# Patient Record
Sex: Female | Born: 1985 | Race: Black or African American | Hispanic: No | Marital: Single | Smoking: Never smoker
Health system: Southern US, Community
[De-identification: ages and names within clinical notes are randomized; demographics above are authoritative.]

## PROBLEM LIST (undated history)

## (undated) DIAGNOSIS — R252 Cramp and spasm: Secondary | ICD-10-CM

## (undated) DIAGNOSIS — Z975 Presence of (intrauterine) contraceptive device: Secondary | ICD-10-CM

## (undated) DIAGNOSIS — Z3490 Encounter for supervision of normal pregnancy, unspecified, unspecified trimester: Secondary | ICD-10-CM

## (undated) DIAGNOSIS — E876 Hypokalemia: Secondary | ICD-10-CM

## (undated) DIAGNOSIS — T50905A Adverse effect of unspecified drugs, medicaments and biological substances, initial encounter: Secondary | ICD-10-CM

## (undated) DIAGNOSIS — R609 Edema, unspecified: Secondary | ICD-10-CM

## (undated) DIAGNOSIS — R87629 Unspecified abnormal cytological findings in specimens from vagina: Secondary | ICD-10-CM

## (undated) DIAGNOSIS — G935 Compression of brain: Secondary | ICD-10-CM

## (undated) DIAGNOSIS — Z30017 Encounter for initial prescription of implantable subdermal contraceptive: Secondary | ICD-10-CM

## (undated) DIAGNOSIS — IMO0002 Reserved for concepts with insufficient information to code with codable children: Secondary | ICD-10-CM

## (undated) DIAGNOSIS — R140 Abdominal distension (gaseous): Secondary | ICD-10-CM

## (undated) DIAGNOSIS — R87619 Unspecified abnormal cytological findings in specimens from cervix uteri: Secondary | ICD-10-CM

## (undated) DIAGNOSIS — K219 Gastro-esophageal reflux disease without esophagitis: Secondary | ICD-10-CM

## (undated) DIAGNOSIS — N911 Secondary amenorrhea: Secondary | ICD-10-CM

## (undated) DIAGNOSIS — E669 Obesity, unspecified: Secondary | ICD-10-CM

## (undated) HISTORY — DX: Unspecified abnormal cytological findings in specimens from cervix uteri: R87.619

## (undated) HISTORY — DX: Abdominal distension (gaseous): R14.0

## (undated) HISTORY — DX: Reserved for concepts with insufficient information to code with codable children: IMO0002

## (undated) HISTORY — DX: Hypokalemia: E87.6

## (undated) HISTORY — DX: Secondary amenorrhea: N91.1

## (undated) HISTORY — DX: Unspecified abnormal cytological findings in specimens from vagina: R87.629

## (undated) HISTORY — DX: Encounter for initial prescription of implantable subdermal contraceptive: Z30.017

## (undated) HISTORY — DX: Obesity, unspecified: E66.9

## (undated) HISTORY — DX: Presence of (intrauterine) contraceptive device: Z97.5

## (undated) HISTORY — DX: Edema, unspecified: R60.9

## (undated) HISTORY — DX: Gastro-esophageal reflux disease without esophagitis: K21.9

## (undated) HISTORY — DX: Adverse effect of unspecified drugs, medicaments and biological substances, initial encounter: T50.905A

## (undated) HISTORY — DX: Cramp and spasm: R25.2

---

## 2009-02-13 ENCOUNTER — Emergency Department: Payer: Self-pay | Admitting: Internal Medicine

## 2011-07-04 ENCOUNTER — Emergency Department (HOSPITAL_COMMUNITY)
Admission: EM | Admit: 2011-07-04 | Discharge: 2011-07-04 | Disposition: A | Discharge status: Home / Self Care | Payer: Self-pay | Attending: Emergency Medicine | Admitting: Emergency Medicine

## 2011-07-04 ENCOUNTER — Emergency Department (HOSPITAL_COMMUNITY): Payer: Self-pay

## 2011-07-04 ENCOUNTER — Encounter: Payer: Self-pay | Admitting: *Deleted

## 2011-07-04 DIAGNOSIS — N12 Tubulo-interstitial nephritis, not specified as acute or chronic: Secondary | ICD-10-CM | POA: Insufficient documentation

## 2011-07-04 LAB — COMPREHENSIVE METABOLIC PANEL
Albumin: 3.2 g/dL — ABNORMAL LOW (ref 3.5–5.2)
BUN: 6 mg/dL (ref 6–23)
CO2: 23 mEq/L (ref 19–32)
Calcium: 8.9 mg/dL (ref 8.4–10.5)
Chloride: 99 mEq/L (ref 96–112)
Creatinine, Ser: 0.89 mg/dL (ref 0.50–1.10)
GFR calc non Af Amer: >60 mL/min (ref >60)
Total Bilirubin: 0.3 mg/dL (ref 0.3–1.2)

## 2011-07-04 LAB — URINALYSIS, ROUTINE W REFLEX MICROSCOPIC
Nitrite: NEGATIVE
Protein, ur: 100 mg/dL — AB

## 2011-07-04 LAB — CBC
HCT: 36.2 % (ref 36.0–46.0)
Hemoglobin: 12.1 g/dL (ref 12.0–15.0)
MCH: 28.2 pg (ref 26.0–34.0)
MCHC: 33.4 g/dL (ref 30.0–36.0)
MCV: 84.4 fL (ref 78.0–100.0)
RDW: 13.6 % (ref 11.5–15.5)

## 2011-07-04 LAB — DIFFERENTIAL
Basophils Absolute: 0 10*3/uL (ref 0.0–0.1)
Basophils Relative: 0 % (ref 0–1)
Eosinophils Relative: 0 % (ref 0–5)
Monocytes Absolute: 2.6 10*3/uL — ABNORMAL HIGH (ref 0.1–1.0)
Monocytes Relative: 15 % — ABNORMAL HIGH (ref 3–12)
Neutro Abs: 12.6 10*3/uL — ABNORMAL HIGH (ref 1.7–7.7)

## 2011-07-04 LAB — POCT PREGNANCY, URINE: Preg Test, Ur: NEGATIVE

## 2011-07-04 LAB — LIPASE, BLOOD: Lipase: 13 U/L (ref 11–59)

## 2011-07-04 MED ORDER — IOHEXOL 300 MG/ML  SOLN
100.0000 mL | Freq: Once | INTRAMUSCULAR | Status: AC | PRN
  Administered 2011-07-04: 100 mL via INTRAVENOUS

## 2011-07-04 MED ORDER — ACETAMINOPHEN 325 MG PO TABS
650.0000 mg | ORAL_TABLET | Freq: Once | ORAL | Status: AC
  Administered 2011-07-04: 650 mg via ORAL
  Filled 2011-07-04: qty 2

## 2011-07-04 MED ORDER — SODIUM CHLORIDE 0.9 % IV SOLN
999.0000 mL | Freq: Once | INTRAVENOUS | Status: AC
  Administered 2011-07-04: 1000 mL via INTRAVENOUS

## 2011-07-04 MED ORDER — CIPROFLOXACIN IN D5W 400 MG/200ML IV SOLN
400.0000 mg | Freq: Once | INTRAVENOUS | Status: AC
  Administered 2011-07-04: 400 mg via INTRAVENOUS
  Filled 2011-07-04: qty 200

## 2011-07-04 MED ORDER — ONDANSETRON HCL 4 MG/2ML IJ SOLN
4.0000 mg | Freq: Once | INTRAMUSCULAR | Status: AC
  Administered 2011-07-04: 4 mg via INTRAVENOUS
  Filled 2011-07-04: qty 2

## 2011-07-04 MED ORDER — CIPROFLOXACIN HCL 500 MG PO TABS: 500.0000 mg | ORAL_TABLET | Freq: Two times a day (BID) | ORAL | Status: AC

## 2011-07-04 NOTE — ED Notes (Addendum)
Pt. Ambulatory to restroom--returned to carrier--IV fluids infusing--no redness or edema at site.  Oral temp rechecked 98.8--reports feeling much improved.

## 2011-07-04 NOTE — ED Provider Notes (Signed)
History    history is per the patient. She notes 4 days of nausea vomiting and diffuse mild abdominal pain that began gradually today she also developed diarrhea no blood in her stool or hematemesis his complaints are completed by generalized sense of being unwell chills subjective fever and now dysuria The patient has not been able to do anything to make herself better or worse and has no sick contacts last menstrual period 2 weeks ago she is a new ring in place and denies any gynecologic issues CSN: [id number] Arrival date & time: 07/04/2011  1:46 PM  Chief Complaint  Patient presents with  . Emesis  . Abdominal Pain   HPI  History reviewed. No pertinent past medical history.  History reviewed. No pertinent past surgical history.  No family history on file.  History  Substance Use Topics  . Smoking status: Never Smoker   . Smokeless tobacco: Not on file  . Alcohol Use: No    OB History    Grav Para Term Preterm Abortions TAB SAB Ect Mult Living                  Review of Systems  Constitutional: Positive for fever and chills.  HENT: Negative for congestion and sore throat.   Eyes: Negative.   Respiratory: Negative for chest tightness.   Cardiovascular: Negative.   Gastrointestinal: Positive for nausea, vomiting and diarrhea.  Genitourinary: Positive for dysuria.  Musculoskeletal: Negative.   Skin: Negative.   Neurological: Positive for headaches.    Physical Exam  BP 108/77  Pulse 120  Temp(Src) 102.7 F (39.3 C) (Oral)  Resp 16  Ht 5\' 6"  (1.676 m)  Wt 190 lb (86.183 kg)  BMI 30.67 kg/m2  SpO2 100%  LMP 05/16/2011  Physical Exam  Constitutional: She is oriented to person, place, and time. She appears well-developed and well-nourished.  HENT:  Head: Normocephalic and atraumatic.  Eyes: EOM are normal.  Cardiovascular: Normal rate and regular rhythm.   Pulmonary/Chest: Effort normal and breath sounds normal.  Abdominal: She exhibits no distension.    Musculoskeletal: She exhibits no edema and no tenderness.  Neurological: She is alert and oriented to person, place, and time.  Skin: Skin is warm and dry.    ED Course  Procedures  CT abd/pelv: No acute appy, findings c/w L pyelo  Labs notable for UTI / pyelo  1749: Patient feeling better, sitting upright and eating.   MDM Previously well young female presenting with several days of nausea vomiting diarrhea and dysuria. Labs and CT consistent with high level. On rebound following analgesia fluids patient is sitting upright eating talking interacting with her colleague. She'll receive a dose of IV Cipro and be discharged with oral with PMD followup.      Gerhard Munch, MD 07/04/11 [phone]

## 2011-07-04 NOTE — ED Notes (Signed)
Returned from CT scan--Stable--Oral Temp 100.7

## 2011-07-04 NOTE — ED Notes (Signed)
N/v and mid abd pain x 3 days.  Denies vomiting for last 2 days.  Diarrhea started today.  C/o chills and weakness.

## 2011-07-04 NOTE — ED Notes (Signed)
Cipro infusing into primary IV Right hand---Pt. Continues to report improvement---eating sandwich and tolerating.

## 2011-07-04 NOTE — ED Notes (Signed)
Pt self ambulated out with a steady gait stating no needs 

## 2011-10-28 ENCOUNTER — Encounter (HOSPITAL_COMMUNITY): Payer: Self-pay

## 2011-10-28 ENCOUNTER — Emergency Department (HOSPITAL_COMMUNITY): Payer: Self-pay

## 2011-10-28 ENCOUNTER — Emergency Department (HOSPITAL_COMMUNITY)
Admission: EM | Admit: 2011-10-28 | Discharge: 2011-10-28 | Disposition: A | Discharge status: Home / Self Care | Payer: Self-pay | Attending: Emergency Medicine | Admitting: Emergency Medicine

## 2011-10-28 DIAGNOSIS — K314 Gastric diverticulum: Secondary | ICD-10-CM | POA: Insufficient documentation

## 2011-10-28 DIAGNOSIS — M545 Low back pain, unspecified: Secondary | ICD-10-CM | POA: Insufficient documentation

## 2011-10-28 DIAGNOSIS — R142 Eructation: Secondary | ICD-10-CM | POA: Insufficient documentation

## 2011-10-28 DIAGNOSIS — R141 Gas pain: Secondary | ICD-10-CM | POA: Insufficient documentation

## 2011-10-28 DIAGNOSIS — R319 Hematuria, unspecified: Secondary | ICD-10-CM | POA: Insufficient documentation

## 2011-10-28 LAB — URINE MICROSCOPIC-ADD ON

## 2011-10-28 LAB — URINALYSIS, ROUTINE W REFLEX MICROSCOPIC
Glucose, UA: NEGATIVE mg/dL
Specific Gravity, Urine: 1.025 (ref 1.005–1.030)
pH: 6 (ref 5.0–8.0)

## 2011-10-28 LAB — POCT PREGNANCY, URINE: Preg Test, Ur: NEGATIVE

## 2011-10-28 MED ORDER — NAPROXEN 375 MG PO TABS: 375.0000 mg | ORAL_TABLET | Freq: Two times a day (BID) | ORAL | Status: AC

## 2011-10-28 MED ORDER — ONDANSETRON HCL 4 MG PO TABS: 4.0000 mg | ORAL_TABLET | Freq: Four times a day (QID) | ORAL | Status: AC

## 2011-10-28 MED ORDER — ONDANSETRON 8 MG PO TBDP
8.0000 mg | ORAL_TABLET | Freq: Once | ORAL | Status: AC
  Administered 2011-10-28: 8 mg via ORAL
  Filled 2011-10-28: qty 1

## 2011-10-28 MED ORDER — IBUPROFEN 800 MG PO TABS
800.0000 mg | ORAL_TABLET | Freq: Once | ORAL | Status: AC
  Administered 2011-10-28: 800 mg via ORAL
  Filled 2011-10-28: qty 1

## 2011-10-28 MED ORDER — CYCLOBENZAPRINE HCL 10 MG PO TABS
10.0000 mg | ORAL_TABLET | Freq: Once | ORAL | Status: AC
  Administered 2011-10-28: 10 mg via ORAL
  Filled 2011-10-28: qty 1

## 2011-10-28 MED ORDER — HYDROMORPHONE HCL PF 1 MG/ML IJ SOLN
1.0000 mg | Freq: Once | INTRAMUSCULAR | Status: AC
  Administered 2011-10-28: 1 mg via INTRAMUSCULAR
  Filled 2011-10-28: qty 1

## 2011-10-28 NOTE — ED Notes (Deleted)
Pt c/o swelling and pain to right testicle since last night, states he had surgery in 2009 for same.

## 2011-10-28 NOTE — ED Notes (Signed)
Lower back pain onset Wednesday, no injury, no urinary symptoms

## 2011-10-28 NOTE — ED Provider Notes (Signed)
History     CSN: [id number] Arrival date & time: 10/28/2011  2:11 AM   First MD Initiated Contact with Patient 10/28/11 0214      Chief Complaint  Patient presents with  . Back Pain    (Consider location/radiation/quality/duration/timing/severity/associated sxs/prior treatment) Patient is a 25 y.o. female presenting with back pain. The history is provided by the patient.  Back Pain  This is a recurrent problem. Episode onset: About 5 days ago. The problem occurs every several days. The problem has been gradually worsening. The pain is associated with no known injury. The pain is present in the lumbar spine. The quality of the pain is described as shooting. The pain does not radiate. The pain is severe. The symptoms are aggravated by bending, twisting and certain positions. The pain is the same all the time. Pertinent negatives include no chest pain, no fever, no numbness, no weight loss, no headaches, no abdominal pain, no abdominal swelling, no bowel incontinence, no perianal numbness, no bladder incontinence, no dysuria, no pelvic pain, no leg pain, no paresthesias, no paresis and no weakness. She has tried nothing for the symptoms.   located left lower lumbar region and radiates somewhat to her left flank. She has a history of similar back pain in the past that she related to lifting at work. She denies any recent lifting bending or twisting that may have exacerbated this pain. No fall or recalled trauma. No blood in her urine and no difficulty urinating, no dysuria urgency or frequency. Some nausea but no vomiting. No diarrhea or constipation.  History reviewed. No pertinent past medical history.  History reviewed. No pertinent past surgical history.  History reviewed. No pertinent family history.  History  Substance Use Topics  . Smoking status: Never Smoker   . Smokeless tobacco: Not on file  . Alcohol Use: No    OB History    Grav Para Term Preterm Abortions TAB SAB Ect Mult  Living                  Review of Systems  Constitutional: Negative for fever, chills and weight loss.  HENT: Negative for neck pain and neck stiffness.   Eyes: Negative for pain.  Respiratory: Negative for shortness of breath.   Cardiovascular: Negative for chest pain, palpitations and leg swelling.  Gastrointestinal: Negative for vomiting, abdominal pain and bowel incontinence.  Genitourinary: Negative for bladder incontinence, dysuria and pelvic pain.  Musculoskeletal: Positive for back pain. Negative for joint swelling and gait problem.  Skin: Negative for rash.  Neurological: Negative for weakness, numbness, headaches and paresthesias.  All other systems reviewed and are negative.    Allergies  Penicillins  Home Medications   Current Outpatient Rx  Name Route Sig Dispense Refill  . ETONOGESTREL-ETHINYL ESTRADIOL 0.12-0.015 MG/24HR VA RING Vaginal Place 1 each vaginally every 28 (twenty-eight) days. Insert vaginally and leave in place for 3 consecutive weeks, then remove for 1 week.       BP 114/59  Pulse 89  Temp(Src) 98.2 F (36.8 C) (Oral)  Resp 16  Ht 5\' 6"  (1.676 m)  Wt 180 lb (81.647 kg)  BMI 29.05 kg/m2  SpO2 97%  LMP 10/08/2011  Physical Exam  Constitutional: She is oriented to person, place, and time. She appears well-developed and well-nourished.  HENT:  Head: Normocephalic and atraumatic.  Eyes: Conjunctivae and EOM are normal. Pupils are equal, round, and reactive to light.  Neck: Trachea normal. Neck supple. No thyromegaly present.  Cardiovascular: Normal  rate, regular rhythm, S1 normal, S2 normal and normal pulses.     No systolic murmur is present   No diastolic murmur is present  Pulses:      Radial pulses are 2+ on the right side, and 2+ on the left side.  Pulmonary/Chest: Effort normal and breath sounds normal. She has no wheezes. She has no rhonchi. She has no rales. She exhibits no tenderness.  Abdominal: Soft. Normal appearance and bowel  sounds are normal. There is no tenderness. There is no CVA tenderness and negative Murphy's sign.  Musculoskeletal:       Left para lumbar tenderness with no midline tenderness or deformity. No discoloration or rash. Lower extremity strengths, sensorium to light touch and DTRs are equal and intact  Neurological: She is alert and oriented to person, place, and time. She has normal strength. No cranial nerve deficit or sensory deficit. GCS eye subscore is 4. GCS verbal subscore is 5. GCS motor subscore is 6.  Skin: Skin is warm and dry. No rash noted. She is not diaphoretic.  Psychiatric: Her speech is normal.       Cooperative and appropriate    ED Course  Procedures (including critical care time)  Results for orders placed during the hospital encounter of 10/28/11  URINALYSIS, ROUTINE W REFLEX MICROSCOPIC      Component Value Range   Color, Urine YELLOW  YELLOW    APPearance HAZY (*) CLEAR    Specific Gravity, Urine 1.025  1.005 - 1.030    pH 6.0  5.0 - 8.0    Glucose, UA NEGATIVE  NEGATIVE (mg/dL)   Hgb urine dipstick MODERATE (*) NEGATIVE    Bilirubin Urine NEGATIVE  NEGATIVE    Ketones, ur NEGATIVE  NEGATIVE (mg/dL)   Protein, ur NEGATIVE  NEGATIVE (mg/dL)   Urobilinogen, UA 0.2  0.0 - 1.0 (mg/dL)   Nitrite NEGATIVE  NEGATIVE    Leukocytes, UA NEGATIVE  NEGATIVE   POCT PREGNANCY, URINE      Component Value Range   Preg Test, Ur NEGATIVE    URINE MICROSCOPIC-ADD ON      Component Value Range   Squamous Epithelial / LPF MANY (*) RARE    WBC, UA 3-6  <3 (WBC/hpf)   RBC / HPF 7-10  <3 (RBC/hpf)   Bacteria, UA FEW (*) RARE    Pain control  UA reviewed and urine culture sent. For hematuria CT scan was obtained  CT scan to evaluate for possible kidney stone given severe pain and blood in urine.   MDM  Lower back pain associated hematuria. Symptoms improved with pain medications. CT reviewed as above and patient stable for discharge home. Prescriptions for ibuprofen Flexeril  provided as needed. Urine culture pending. Back pain precautions verbalized is understood        Sunnie Nielsen, MD 10/28/11 [phone]

## 2011-10-28 NOTE — ED Notes (Signed)
Patient was given warm blanket.

## 2011-10-30 LAB — URINE CULTURE: Colony Count: 40000

## 2012-11-23 LAB — OB RESULTS CONSOLE HIV ANTIBODY (ROUTINE TESTING): HIV: NONREACTIVE

## 2012-11-23 LAB — OB RESULTS CONSOLE PLATELET COUNT: Platelets: 260 10*3/uL

## 2012-11-23 LAB — OB RESULTS CONSOLE HGB/HCT, BLOOD: Hemoglobin: 14.2 g/dL

## 2012-11-23 LAB — OB RESULTS CONSOLE ABO/RH: RH Type: POSITIVE

## 2012-12-08 ENCOUNTER — Emergency Department (HOSPITAL_COMMUNITY)
Admission: EM | Admit: 2012-12-08 | Discharge: 2012-12-08 | Disposition: A | Discharge status: Home / Self Care | Payer: Self-pay | Attending: Emergency Medicine | Admitting: Emergency Medicine

## 2012-12-08 ENCOUNTER — Encounter (HOSPITAL_COMMUNITY): Payer: Self-pay | Admitting: *Deleted

## 2012-12-08 DIAGNOSIS — R51 Headache: Secondary | ICD-10-CM | POA: Insufficient documentation

## 2012-12-08 DIAGNOSIS — J069 Acute upper respiratory infection, unspecified: Secondary | ICD-10-CM | POA: Insufficient documentation

## 2012-12-08 DIAGNOSIS — Z3490 Encounter for supervision of normal pregnancy, unspecified, unspecified trimester: Secondary | ICD-10-CM

## 2012-12-08 DIAGNOSIS — Z331 Pregnant state, incidental: Secondary | ICD-10-CM | POA: Insufficient documentation

## 2012-12-08 DIAGNOSIS — H9209 Otalgia, unspecified ear: Secondary | ICD-10-CM | POA: Insufficient documentation

## 2012-12-08 HISTORY — DX: Encounter for supervision of normal pregnancy, unspecified, unspecified trimester: Z34.90

## 2012-12-08 MED ORDER — SODIUM CHLORIDE 0.65 % NA SOLN: 1.0000 | NASAL | Status: DC | PRN

## 2012-12-08 MED ORDER — CHLORPHENIRAMINE MALEATE 4 MG PO TABS: ORAL_TABLET | ORAL | Status: DC

## 2012-12-08 NOTE — ED Provider Notes (Signed)
History     CSN: [id number]  Arrival date & time 12/08/12  1001   First MD Initiated Contact with Patient 12/08/12 1034      Chief Complaint  Patient presents with  . Nasal Congestion    (Consider location/radiation/quality/duration/timing/severity/associated sxs/prior treatment) Patient is a 27 y.o. female presenting with URI. The history is provided by the patient.  URI The primary symptoms include headaches and ear pain. Primary symptoms do not include fever, sore throat, cough, wheezing, abdominal pain, nausea, vomiting, myalgias, arthralgias or rash. The current episode started 3 to 5 days ago. This is a new problem.  The headache is not associated with photophobia.  The onset of the illness is associated with exposure to sick contacts. Symptoms associated with the illness include facial pain, sinus pressure and congestion. The illness is not associated with chills. Risk factors: [redacted] weeks pregnant.    Past Medical History  Diagnosis Date  . Pregnancy     History reviewed. No pertinent past surgical history.  No family history on file.  History  Substance Use Topics  . Smoking status: Never Smoker   . Smokeless tobacco: Not on file  . Alcohol Use: No    OB History    Grav Para Term Preterm Abortions TAB SAB Ect Mult Living   1               Review of Systems  Constitutional: Negative for fever, chills and activity change.       All ROS Neg except as noted in HPI  HENT: Positive for ear pain, congestion and sinus pressure. Negative for nosebleeds, sore throat and neck pain.   Eyes: Negative for photophobia and discharge.  Respiratory: Negative for cough, shortness of breath and wheezing.   Cardiovascular: Negative for chest pain and palpitations.  Gastrointestinal: Negative for nausea, vomiting, abdominal pain and blood in stool.  Genitourinary: Negative for dysuria, frequency and hematuria.  Musculoskeletal: Negative for myalgias, back pain and arthralgias.    Skin: Negative.  Negative for rash.  Neurological: Positive for headaches. Negative for dizziness, seizures and speech difficulty.  Psychiatric/Behavioral: Negative for hallucinations and confusion.    Allergies  Penicillins  Home Medications   Current Outpatient Rx  Name  Route  Sig  Dispense  Refill  . ACETAMINOPHEN 500 MG PO TABS   Oral   Take 500 mg by mouth every 6 (six) hours as needed. For headache or pain         . DOCUSATE SODIUM 100 MG PO CAPS   Oral   Take 100 mg by mouth 2 (two) times daily as needed. For constipation         . OMEPRAZOLE 20 MG PO CPDR   Oral   Take 20 mg by mouth daily.         Marland Kitchen PRENATAL MULTIVITAMIN CH   Oral   Take 1 tablet by mouth daily.         Marland Kitchen B-6 PO   Oral   Take 1 tablet by mouth daily.           BP 97/66  Pulse 102  Temp 98 F (36.7 C)  Resp 18  Ht 5\' 6"  (1.676 m)  Wt 193 lb (87.544 kg)  BMI 31.15 kg/m2  SpO2 100%  LMP 10/08/2011  Physical Exam  Nursing note and vitals reviewed. Constitutional: She is oriented to person, place, and time. She appears well-developed and well-nourished.  Non-toxic appearance.  HENT:  Head: Normocephalic.  Right  Ear: Tympanic membrane and external ear normal.  Left Ear: Tympanic membrane and external ear normal.       Nasal congestion. Mild pain to percussion  Over the sinuses.  Eyes: EOM and lids are normal. Pupils are equal, round, and reactive to light.  Neck: Normal range of motion. Neck supple. Carotid bruit is not present.  Cardiovascular: Normal rate, regular rhythm, normal heart sounds, intact distal pulses and normal pulses.   Pulmonary/Chest: Breath sounds normal. No respiratory distress.  Abdominal: Soft. Bowel sounds are normal. There is no tenderness. There is no guarding.  Musculoskeletal: Normal range of motion.  Lymphadenopathy:       Head (right side): No submandibular adenopathy present.       Head (left side): No submandibular adenopathy present.    She  has no cervical adenopathy.  Neurological: She is alert and oriented to person, place, and time. She has normal strength. No cranial nerve deficit or sensory deficit.  Skin: Skin is warm and dry.  Psychiatric: She has a normal mood and affect. Her speech is normal.    ED Course  Procedures (including critical care time)  Labs Reviewed - No data to display No results found.   No diagnosis found.    MDM  **I have reviewed nursing notes, vital signs, and all appropriate lab and imaging results for this patient.* Pt presents with several days of congestion, facial pain, and runy nose. Suspect sinusitis with URI. Rx for clors-tremeton and saline nasal spray given to the patient. Pt will return if not improving.       Kathie Dike, [place] 12/13/12 1115

## 2012-12-08 NOTE — ED Notes (Signed)
Pt c/o nasal congestion, "head stopped up", facial pain across sinus area, runny nose that started Wednesday, denies any fever, n/v

## 2012-12-13 NOTE — ED Provider Notes (Signed)
Medical screening examination/treatment/procedure(s) were performed by non-physician practitioner and as supervising physician I was immediately available for consultation/collaboration.  Raeford Razor, MD 12/13/12 [phone]

## 2012-12-20 ENCOUNTER — Encounter (HOSPITAL_COMMUNITY): Payer: Self-pay | Admitting: Emergency Medicine

## 2012-12-20 ENCOUNTER — Emergency Department (HOSPITAL_COMMUNITY)
Admission: EM | Admit: 2012-12-20 | Discharge: 2012-12-20 | Disposition: A | Discharge status: Home / Self Care | Payer: Medicaid Other | Attending: Emergency Medicine | Admitting: Emergency Medicine

## 2012-12-20 DIAGNOSIS — O9989 Other specified diseases and conditions complicating pregnancy, childbirth and the puerperium: Secondary | ICD-10-CM | POA: Insufficient documentation

## 2012-12-20 DIAGNOSIS — R197 Diarrhea, unspecified: Secondary | ICD-10-CM | POA: Insufficient documentation

## 2012-12-20 DIAGNOSIS — O26859 Spotting complicating pregnancy, unspecified trimester: Secondary | ICD-10-CM | POA: Insufficient documentation

## 2012-12-20 DIAGNOSIS — Z79899 Other long term (current) drug therapy: Secondary | ICD-10-CM | POA: Insufficient documentation

## 2012-12-20 DIAGNOSIS — N760 Acute vaginitis: Secondary | ICD-10-CM | POA: Insufficient documentation

## 2012-12-20 DIAGNOSIS — O98819 Other maternal infectious and parasitic diseases complicating pregnancy, unspecified trimester: Secondary | ICD-10-CM | POA: Insufficient documentation

## 2012-12-20 DIAGNOSIS — B9689 Other specified bacterial agents as the cause of diseases classified elsewhere: Secondary | ICD-10-CM

## 2012-12-20 DIAGNOSIS — Z3490 Encounter for supervision of normal pregnancy, unspecified, unspecified trimester: Secondary | ICD-10-CM

## 2012-12-20 LAB — WET PREP, GENITAL
Trich, Wet Prep: NONE SEEN
Yeast Wet Prep HPF POC: NONE SEEN

## 2012-12-20 MED ORDER — METRONIDAZOLE 500 MG PO TABS: 500.0000 mg | ORAL_TABLET | Freq: Two times a day (BID) | ORAL | Status: DC

## 2012-12-20 NOTE — ED Provider Notes (Signed)
History   This chart was scribed for Carleene Cooper III, MD, by Frederik Pear, ER scribe. The patient was seen in room APA04/AP[facility]04 and the patient's care was started at 0814.    CSN: [id number]  Arrival date & time 12/20/12  0754   First MD Initiated Contact with Patient 12/20/12 [phone]      Chief Complaint  Patient presents with  . Vaginal Bleeding    (Consider location/radiation/quality/duration/timing/severity/associated sxs/prior treatment) HPI  Mary Moses is a 27 y.o. female who presents to the Emergency Department complaining of sudden onset, mild, intermittent brownish vaginal spotting with associated diarrhea that began this morning. She denies any abdominal pain, cramping, extremity swelling, fever, ear pain, sore throat, cough, emesis, dysuria, chest pain, rash, or syncope. She reports that she is currently [redacted] week pregnant with her first pregnancy and is due on 07/18. Her prenatal care is managed in [facility] and is taking prenatal vitamins and has already had an ultrasound completed. Her next appointment is tomorrow to determine the sex of the baby. She has no chronic medical conditions that she require daily medications. She has no h/o of surgeries. She reports that she is allergic to penicillin, which causes her to break out in a rash. She is a current nonsmoker who does not consume ETOH.     Past Medical History  Diagnosis Date  . Pregnancy     History reviewed. No pertinent past surgical history.  History reviewed. No pertinent family history.  History  Substance Use Topics  . Smoking status: Never Smoker   . Smokeless tobacco: Not on file  . Alcohol Use: No    OB History    Grav Para Term Preterm Abortions TAB SAB Ect Mult Living   1               Review of Systems  Constitutional: Negative for fever.  HENT: Negative for ear pain and sore throat.   Respiratory: Negative for cough.   Cardiovascular: Negative for chest pain.  Gastrointestinal:  Positive for diarrhea. Negative for vomiting and abdominal pain.  Genitourinary: Positive for vaginal bleeding. Negative for dysuria and menstrual problem.  Skin: Negative for rash.  Neurological: Negative for syncope.  All other systems reviewed and are negative.    Allergies  Penicillins  Home Medications   Current Outpatient Rx  Name  Route  Sig  Dispense  Refill  . ACETAMINOPHEN 500 MG PO TABS   Oral   Take 500 mg by mouth every 6 (six) hours as needed. For headache or pain         . CHLORPHENIRAMINE MALEATE 4 MG PO TABS      1 po bid for congestion.   14 tablet   0   . DOCUSATE SODIUM 100 MG PO CAPS   Oral   Take 100 mg by mouth 2 (two) times daily as needed. For constipation         . OMEPRAZOLE 20 MG PO CPDR   Oral   Take 20 mg by mouth daily.         Marland Kitchen PRENATAL MULTIVITAMIN CH   Oral   Take 1 tablet by mouth daily.         Marland Kitchen B-6 PO   Oral   Take 1 tablet by mouth daily.         . SODIUM CHLORIDE 0.65 % NA SOLN   Nasal   Place 1 spray into the nose as needed for congestion.   15 mL  12     BP 110/54  Pulse 87  Temp 98.3 F (36.8 C)  Resp 20  Ht 5\' 6"  (1.676 m)  Wt 193 lb (87.544 kg)  BMI 31.15 kg/m2  SpO2 100%  LMP 09/07/2011  Physical Exam  Nursing note and vitals reviewed. Constitutional: She is oriented to person, place, and time. She appears well-developed and well-nourished. No distress.  HENT:  Head: Normocephalic and atraumatic.  Eyes: EOM are normal. Pupils are equal, round, and reactive to light.  Neck: Normal range of motion. Neck supple. No tracheal deviation present.  Cardiovascular: Normal rate.   Pulmonary/Chest: Effort normal. No respiratory distress.  Abdominal: Soft. She exhibits no distension. There is no tenderness.  Genitourinary:       Pregnant with uterus above umbilicus. During chaperoned exam, there is a whitish, slightly purulent discharge, but no actual bleeding.Cervix is closed. No uterine or adnexal  tenderness.  Musculoskeletal: Normal range of motion. She exhibits no edema.  Neurological: She is alert and oriented to person, place, and time.  Skin: Skin is warm and dry.  Psychiatric: She has a normal mood and affect. Her behavior is normal.    ED Course  Procedures (including critical care time)  DIAGNOSTIC STUDIES: Oxygen Saturation is 100% on room air, normal by my interpretation.    COORDINATION OF CARE:  08:25- Discussed planned course of treatment with the patient, including a pelvic exam, who is agreeable at this time.  09:51- Wet prep is positive for clue cells indicating bacterial vaginosis. Will treat pt with metronidazole.  Results for orders placed during the hospital encounter of 12/20/12  WET PREP, GENITAL      Component Value Range   Yeast Wet Prep HPF POC NONE SEEN  NONE SEEN   Trich, Wet Prep NONE SEEN  NONE SEEN   Clue Cells Wet Prep HPF POC MANY (*) NONE SEEN   WBC, Wet Prep HPF POC MANY (*) NONE SEEN    Rx for bacterial vaginosis with metronidazole 500 mg bid x 7 days.  She has a prenatal checkup at [facility] tomorrow; she should take a copy of her lab result and her bottle of medicine to show the doctors at [facility].     1. Bacterial vaginosis   2. Pregnancy    I personally performed the services described in this documentation, which was scribed in my presence. The recorded information has been reviewed and is accurate. Osvaldo Human, MD      Carleene Cooper III, MD 12/20/12 [phone]

## 2012-12-20 NOTE — ED Notes (Signed)
Pt is [redacted] weeks pregnant. Pt c/o brownish color vaginal spotting this am. Denies abd pain/cramping.

## 2012-12-20 NOTE — ED Notes (Signed)
Rn at bedside with EDP for pelvic exam.

## 2012-12-21 LAB — GC/CHLAMYDIA PROBE AMP: GC Probe RNA: NEGATIVE

## 2013-02-27 ENCOUNTER — Encounter: Payer: Self-pay | Admitting: *Deleted

## 2013-03-11 ENCOUNTER — Encounter: Payer: Self-pay | Admitting: *Deleted

## 2013-03-12 ENCOUNTER — Ambulatory Visit (INDEPENDENT_AMBULATORY_CARE_PROVIDER_SITE_OTHER): Payer: Medicaid Other | Admitting: Women's Health

## 2013-03-12 ENCOUNTER — Encounter: Payer: Self-pay | Admitting: Women's Health

## 2013-03-12 VITALS — BP 116/72 | Wt 213.4 lb

## 2013-03-12 DIAGNOSIS — Z331 Pregnant state, incidental: Secondary | ICD-10-CM

## 2013-03-12 DIAGNOSIS — O99210 Obesity complicating pregnancy, unspecified trimester: Secondary | ICD-10-CM

## 2013-03-12 DIAGNOSIS — Z3402 Encounter for supervision of normal first pregnancy, second trimester: Secondary | ICD-10-CM

## 2013-03-12 DIAGNOSIS — Z1389 Encounter for screening for other disorder: Secondary | ICD-10-CM

## 2013-03-12 DIAGNOSIS — Z3403 Encounter for supervision of normal first pregnancy, third trimester: Secondary | ICD-10-CM

## 2013-03-12 DIAGNOSIS — Z3400 Encounter for supervision of normal first pregnancy, unspecified trimester: Secondary | ICD-10-CM | POA: Insufficient documentation

## 2013-03-12 DIAGNOSIS — E669 Obesity, unspecified: Secondary | ICD-10-CM

## 2013-03-12 LAB — POCT URINALYSIS DIPSTICK: Nitrite, UA: NEGATIVE

## 2013-03-12 NOTE — Progress Notes (Signed)
Swelling in hands and feet. Pressure in vaginal area. New ob packet given.

## 2013-03-12 NOTE — Patient Instructions (Addendum)
You will be coming back as soon as possible for your sugar test, please do not eat or drink anything after midnight the night before you come, not even water.  You will be here for at least 2 hours.  Pregnancy - Third Trimester The third trimester of pregnancy (the last 3 months) is a period of the most rapid growth for you and your baby. The baby approaches a length of 20 inches and a weight of 6 to 10 pounds. The baby is adding on fat and getting ready for life outside your body. While inside, babies have periods of sleeping and waking, suck their thumbs, and hiccups. You can often feel small contractions of the uterus. This is false labor. It is also called Braxton-Hicks contractions. This is like a practice for labor. The usual problems in this stage of pregnancy include more difficulty breathing, swelling of the hands and feet from water retention, and having to urinate more often because of the uterus and baby pressing on your bladder.  PRENATAL EXAMS  Blood work may continue to be done during prenatal exams. These tests are done to check on your health and the probable health of your baby. Blood work is used to follow your blood levels (hemoglobin). Anemia (low hemoglobin) is common during pregnancy. Iron and vitamins are given to help prevent this. You may also continue to be checked for diabetes. Some of the past blood tests may be done again.  The size of the uterus is measured during each visit. This makes sure your baby is growing properly according to your pregnancy dates.  Your blood pressure is checked every prenatal visit. This is to make sure you are not getting toxemia.  Your urine is checked every prenatal visit for infection, diabetes and protein.  Your weight is checked at each visit. This is done to make sure gains are happening at the suggested rate and that you and your baby are growing normally.  Sometimes, an ultrasound is performed to confirm the position and the proper  growth and development of the baby. This is a test done that bounces harmless sound waves off the baby so your caregiver can more accurately determine due dates.  Discuss the type of pain medication and anesthesia you will have during your labor and delivery.  Discuss the possibility and anesthesia if a Cesarean Section might be necessary.  Inform your caregiver if there is any mental or physical violence at home. Sometimes, a specialized non-stress test, contraction stress test and biophysical profile are done to make sure the baby is not having a problem. Checking the amniotic fluid surrounding the baby is called an amniocentesis. The amniotic fluid is removed by sticking a needle into the belly (abdomen). This is sometimes done near the end of pregnancy if an early delivery is required. In this case, it is done to help make sure the baby's lungs are mature enough for the baby to live outside of the womb. If the lungs are not mature and it is unsafe to deliver the baby, an injection of cortisone medication is given to the mother 1 to 2 days before the delivery. This helps the baby's lungs mature and makes it safer to deliver the baby. CHANGES OCCURING IN THE THIRD TRIMESTER OF PREGNANCY Your body goes through many changes during pregnancy. They vary from person to person. Talk to your caregiver about changes you notice and are concerned about.  During the last trimester, you have probably had an increase in your  appetite. It is normal to have cravings for certain foods. This varies from person to person and pregnancy to pregnancy.  You may begin to get stretch marks on your hips, abdomen, and breasts. These are normal changes in the body during pregnancy. There are no exercises or medications to take which prevent this change.  Constipation may be treated with a stool softener or adding bulk to your diet. Drinking lots of fluids, fiber in vegetables, fruits, and whole grains are  helpful.  Exercising is also helpful. If you have been very active up until your pregnancy, most of these activities can be continued during your pregnancy. If you have been less active, it is helpful to start an exercise program such as walking. Consult your caregiver before starting exercise programs.  Avoid all smoking, alcohol, un-prescribed drugs, herbs and "street drugs" during your pregnancy. These chemicals affect the formation and growth of the baby. Avoid chemicals throughout the pregnancy to ensure the delivery of a healthy infant.  Backache, varicose veins and hemorrhoids may develop or get worse.  You will tire more easily in the third trimester, which is normal.  The baby's movements may be stronger and more often.  You may become short of breath easily.  Your belly button may stick out.  A yellow discharge may leak from your breasts called colostrum.  You may have a bloody mucus discharge. This usually occurs a few days to a week before labor begins. HOME CARE INSTRUCTIONS   Keep your caregiver's appointments. Follow your caregiver's instructions regarding medication use, exercise, and diet.  During pregnancy, you are providing food for you and your baby. Continue to eat regular, well-balanced meals. Choose foods such as meat, fish, milk and other low fat dairy products, vegetables, fruits, and whole-grain breads and cereals. Your caregiver will tell you of the ideal weight gain.  A physical sexual relationship may be continued throughout pregnancy if there are no other problems such as early (premature) leaking of amniotic fluid from the membranes, vaginal bleeding, or belly (abdominal) pain.  Exercise regularly if there are no restrictions. Check with your caregiver if you are unsure of the safety of your exercises. Greater weight gain will occur in the last 2 trimesters of pregnancy. Exercising helps:  Control your weight.  Get you in shape for labor and  delivery.  You lose weight after you deliver.  Rest a lot with legs elevated, or as needed for leg cramps or low back pain.  Wear a good support or jogging bra for breast tenderness during pregnancy. This may help if worn during sleep. Pads or tissues may be used in the bra if you are leaking colostrum.  Do not use hot tubs, steam rooms, or saunas.  Wear your seat belt when driving. This protects you and your baby if you are in an accident.  Avoid raw meat, cat litter boxes and soil used by cats. These carry germs that can cause birth defects in the baby.  It is easier to loose urine during pregnancy. Tightening up and strengthening the pelvic muscles will help with this problem. You can practice stopping your urination while you are going to the bathroom. These are the same muscles you need to strengthen. It is also the muscles you would use if you were trying to stop from passing gas. You can practice tightening these muscles up 10 times a set and repeating this about 3 times per day. Once you know what muscles to tighten up, do not perform  these exercises during urination. It is more likely to cause an infection by backing up the urine.  Ask for help if you have financial, counseling or nutritional needs during pregnancy. Your caregiver will be able to offer counseling for these needs as well as refer you for other special needs.  Make a list of emergency phone numbers and have them available.  Plan on getting help from family or friends when you go home from the hospital.  Make a trial run to the hospital.  Take prenatal classes with the father to understand, practice and ask questions about the labor and delivery.  Prepare the baby's room/nursery.  Do not travel out of the city unless it is absolutely necessary and with the advice of your caregiver.  Wear only low or no heal shoes to have better balance and prevent falling. MEDICATIONS AND DRUG USE IN PREGNANCY  Take prenatal  vitamins as directed. The vitamin should contain 1 milligram of folic acid. Keep all vitamins out of reach of children. Only a couple vitamins or tablets containing iron may be fatal to a baby or young child when ingested.  Avoid use of all medications, including herbs, over-the-counter medications, not prescribed or suggested by your caregiver. Only take over-the-counter or prescription medicines for pain, discomfort, or fever as directed by your caregiver. Do not use aspirin, ibuprofen (Motrin, Advil, Nuprin) or naproxen (Aleve) unless OK'd by your caregiver.  Let your caregiver also know about herbs you may be using.  Alcohol is related to a number of birth defects. This includes fetal alcohol syndrome. All alcohol, in any form, should be avoided completely. Smoking will cause low birth rate and premature babies.  Street/illegal drugs are very harmful to the baby. They are absolutely forbidden. A baby born to an addicted mother will be addicted at birth. The baby will go through the same withdrawal an adult does. SEEK MEDICAL CARE IF: You have any concerns or worries during your pregnancy. It is better to call with your questions if you feel they cannot wait, rather than worry about them. DECISIONS ABOUT CIRCUMCISION You may or may not know the sex of your baby. If you know your baby is a boy, it may be time to think about circumcision. Circumcision is the removal of the foreskin of the penis. This is the skin that covers the sensitive end of the penis. There is no proven medical need for this. Often this decision is made on what is popular at the time or based upon religious beliefs and social issues. You can discuss these issues with your caregiver or pediatrician. SEEK IMMEDIATE MEDICAL CARE IF:   An unexplained oral temperature above 102 F (38.9 C) develops, or as your caregiver suggests.  You have leaking of fluid from the vagina (birth canal). If leaking membranes are suspected, take  your temperature and tell your caregiver of this when you call.  There is vaginal spotting, bleeding or passing clots. Tell your caregiver of the amount and how many pads are used.  You develop a bad smelling vaginal discharge with a change in the color from clear to white.  You develop vomiting that lasts more than 24 hours.  You develop chills or fever.  You develop shortness of breath.  You develop burning on urination.  You loose more than 2 pounds of weight or gain more than 2 pounds of weight or as suggested by your caregiver.  You notice sudden swelling of your face, hands, and feet or legs.  You develop belly (abdominal) pain. Round ligament discomfort is a common non-cancerous (benign) cause of abdominal pain in pregnancy. Your caregiver still must evaluate you.  You develop a severe headache that does not go away.  You develop visual problems, blurred or double vision.  If you have not felt your baby move for more than 1 hour. If you think the baby is not moving as much as usual, eat something with sugar in it and lie down on your left side for an hour. The baby should move at least 4 to 5 times per hour. Call right away if your baby moves less than that.  You fall, are in a car accident or any kind of trauma.  There is mental or physical violence at home. Document Released: 10/25/2001 Document Revised: 01/23/2012 Document Reviewed: 04/29/2009 [facility] Patient Information 2013 [place], [place].

## 2013-03-12 NOTE — Progress Notes (Signed)
  Subjective:    Mary Moses is a 27 y.o. G1P0 african-american female at [redacted]w[redacted]d being seen today for her first obstetrical visit.  She initiated her pnc @ [facility] and wanted to transfer here d/t being closer to her residence. Her obstetrical history is significant for obesity.  Pregnancy history fully reviewed. Genetic screening, anatomy u/s results, pap results not on prenatals that were faxed to [place].  Only 2 visits, initial ob visit and visit on 4/11 faxed.  Has not had PNII.  Patient reports swelling in hands and legs/feet.  Denies ha, scotomata, ruq/epigastric pain, n/v.  Reports good fm. Denies uc's, lof, vb, urinary frequency, urgency, hesitancy, or dysuria.   Requesting note to give to work saying she is planning on coming out of work on 05/13/13.  Filed Vitals:   03/12/13 1045  BP: 116/72  Weight: 213 lb 6.4 oz (96.798 kg)    HISTORY: OB History   Grav Para Term Preterm Abortions TAB SAB Ect Mult Living   1              # Outc Date GA Lbr Len/2nd Wgt Sex Del Anes PTL Lv   1 CUR              Past Medical History  Diagnosis Date  . Pregnancy   . Obesity   . GERD (gastroesophageal reflux disease)   . Abnormal Pap smear    Past Surgical History  Procedure Laterality Date  . No past surgeries     Family History  Problem Relation Age of Onset  . Kidney disease Mother   . Heart disease Mother   . Cancer Father     metastatic prostate   . Hypertension Other      Exam                                      System: Breast:  deferred   Skin: normal coloration and turgor, no rashes    Neurologic: oriented, normal, normal mood   Extremities: normal strength, tone, and muscle mass. 2+ BLE edema   HEENT PERRLA   Mouth/Teeth mucous membranes moist, pharynx normal without lesions   Neck supple and no masses   Cardiovascular: regular rate and rhythm   Respiratory:  appears well, vitals normal, no respiratory distress, acyanotic, normal RR   Abdomen: soft,  non-tender; bowel sounds normal; no masses,  no organomegaly         FH: 27cm FHR:  152 Assessment:    Pregnancy: G1P0 There are no active problems to display for this patient.  [redacted]w[redacted]d [facility] New ob transfer visit BLE edema    Plan:     Initial labs drawn. Prenatal vitamins. Problem list reviewed and updated. Request genetic screen, anatomy u/s, and pap results, as well as full record of prenatal visits from [facility] Reviewed ptl s/s, ways to decrease peripheral edema Work note given per request stating she is planning on coming out of work on 05/13/13 Follow up asap for PNII and 4wks for next visit  [name] 03/12/2013

## 2013-03-18 ENCOUNTER — Ambulatory Visit (INDEPENDENT_AMBULATORY_CARE_PROVIDER_SITE_OTHER): Payer: Medicaid Other | Admitting: Obstetrics & Gynecology

## 2013-03-18 DIAGNOSIS — Z3402 Encounter for supervision of normal first pregnancy, second trimester: Secondary | ICD-10-CM

## 2013-03-18 DIAGNOSIS — Z3400 Encounter for supervision of normal first pregnancy, unspecified trimester: Secondary | ICD-10-CM

## 2013-03-18 LAB — CBC
HCT: 37.2 % (ref 36.0–46.0)
Hemoglobin: 12.5 g/dL (ref 12.0–15.0)
MCH: 29.1 pg (ref 26.0–34.0)
MCHC: 33.6 g/dL (ref 30.0–36.0)

## 2013-03-19 LAB — HSV 2 ANTIBODY, IGG: HSV 2 Glycoprotein G Ab, IgG: <0.1 IV

## 2013-03-19 LAB — GLUCOSE TOLERANCE, 2 HOURS W/ 1HR: Glucose, 2 hour: 112 mg/dL (ref 70–139)

## 2013-03-23 ENCOUNTER — Emergency Department (HOSPITAL_COMMUNITY)
Admission: EM | Admit: 2013-03-23 | Discharge: 2013-03-24 | Disposition: A | Discharge status: Home / Self Care | Payer: BC Managed Care – PPO | Attending: Emergency Medicine | Admitting: Emergency Medicine

## 2013-03-23 DIAGNOSIS — E669 Obesity, unspecified: Secondary | ICD-10-CM | POA: Insufficient documentation

## 2013-03-23 DIAGNOSIS — Z79899 Other long term (current) drug therapy: Secondary | ICD-10-CM | POA: Insufficient documentation

## 2013-03-23 DIAGNOSIS — Z880 Allergy status to penicillin: Secondary | ICD-10-CM | POA: Insufficient documentation

## 2013-03-23 DIAGNOSIS — O479 False labor, unspecified: Secondary | ICD-10-CM | POA: Insufficient documentation

## 2013-03-23 DIAGNOSIS — K219 Gastro-esophageal reflux disease without esophagitis: Secondary | ICD-10-CM | POA: Insufficient documentation

## 2013-03-23 DIAGNOSIS — R109 Unspecified abdominal pain: Secondary | ICD-10-CM | POA: Insufficient documentation

## 2013-03-24 ENCOUNTER — Encounter (HOSPITAL_COMMUNITY): Payer: Self-pay | Admitting: Emergency Medicine

## 2013-03-24 LAB — URINALYSIS, ROUTINE W REFLEX MICROSCOPIC
Leukocytes, UA: NEGATIVE
Protein, ur: NEGATIVE mg/dL
Urobilinogen, UA: 0.2 mg/dL (ref 0.0–1.0)

## 2013-03-24 LAB — URINE MICROSCOPIC-ADD ON

## 2013-03-24 MED ORDER — FAMOTIDINE 20 MG PO TABS
20.0000 mg | ORAL_TABLET | Freq: Once | ORAL | Status: AC
  Administered 2013-03-24: 20 mg via ORAL
  Filled 2013-03-24: qty 1

## 2013-03-24 NOTE — ED Notes (Signed)
Patient c/o intermittent abdominal cramping a few hours.

## 2013-03-24 NOTE — ED Provider Notes (Signed)
History     CSN: [id number]  Arrival date & time 03/23/13  2346   First MD Initiated Contact with Patient 03/24/13 0000      Chief Complaint  Patient presents with  . Labor Eval    (Consider location/radiation/quality/duration/timing/severity/associated sxs/prior treatment) The history is provided by the patient.   27 year old female who is approximately [redacted] weeks gestation, comes in because of abdominal cramping. Cramping started about 9 PM tonight but does not seem to be rhythmic. Pain is moderate and she rates it as 6/10. Pregnancy has been uncomplicated. She is gravida 1, para 0. She is taking acetaminophen for the cramping with no relief. There is no associated nausea or vomiting. She states that she has been having problems with a lot of belching with sour taste. She denies fever, chills, sweats. Next prenatal visit is scheduled for May 27.  Past Medical History  Diagnosis Date  . Pregnancy   . Obesity   . GERD (gastroesophageal reflux disease)   . Abnormal Pap smear     Past Surgical History  Procedure Laterality Date  . No past surgeries      Family History  Problem Relation Age of Onset  . Kidney disease Mother   . Heart disease Mother   . Cancer Father     metastatic prostate   . Hypertension Other     History  Substance Use Topics  . Smoking status: Never Smoker   . Smokeless tobacco: Not on file  . Alcohol Use: No    OB History   Grav Para Term Preterm Abortions TAB SAB Ect Mult Living   1               Review of Systems  All other systems reviewed and are negative.    Allergies  Penicillins  Home Medications   Current Outpatient Rx  Name  Route  Sig  Dispense  Refill  . acetaminophen (TYLENOL) 500 MG tablet   Oral   Take 500 mg by mouth every 6 (six) hours as needed. For headache or pain         . docusate sodium (COLACE) 100 MG capsule   Oral   Take 100 mg by mouth 2 (two) times daily as needed. For constipation         .  omeprazole (PRILOSEC) 20 MG capsule   Oral   Take 20 mg by mouth daily.         . Prenatal Vit-Fe Fumarate-FA (PRENATAL MULTIVITAMIN) TABS   Oral   Take 1 tablet by mouth daily.         . metroNIDAZOLE (FLAGYL) 500 MG tablet   Oral   Take 1 tablet (500 mg total) by mouth 2 (two) times daily.   14 tablet   0   . Pyridoxine HCl (B-6 PO)   Oral   Take 1 tablet by mouth daily.           BP 130/74  Pulse 117  Temp(Src) 98.4 F (36.9 C) (Oral)  Resp 20  Ht 5\' 6"  (1.676 m)  Wt 213 lb (96.616 kg)  BMI 34.4 kg/m2  SpO2 97%  LMP 09/07/2011  Physical Exam  Nursing note and vitals reviewed.  27 year old female, resting comfortably and in no acute distress. Vital signs are significant for tachycardia with heart rate 117. Oxygen saturation is 97%, which is normal. Head is normocephalic and atraumatic. PERRLA, EOMI. Oropharynx is clear. Neck is nontender and supple without adenopathy or JVD.  Back is nontender and there is no CVA tenderness. Lungs are clear without rales, wheezes, or rhonchi. Chest is nontender. Heart has regular rate and rhythm without murmur. Abdomen is soft, flat, nontender without masses or hepatosplenomegaly and peristalsis is normoactive. Uterine fundus extends into the epigastric area consistent with stage of gestation. Pelvic: Digital exam shows cervix is fairly far posterior but is not dilated and not effaced. Extremities have no cyanosis or edema, full range of motion is present. Skin is warm and dry without rash. Neurologic: Mental status is normal, cranial nerves are intact, there are no motor or sensory deficits.  ED Course  Procedures (including critical care time)  Results for orders placed during the hospital encounter of 03/23/13  URINALYSIS, ROUTINE W REFLEX MICROSCOPIC      Result Value Range   Color, Urine YELLOW  YELLOW   APPearance CLEAR  CLEAR   Specific Gravity, Urine <1.005 (*) 1.005 - 1.030   pH 6.5  5.0 - 8.0   Glucose, UA  NEGATIVE  NEGATIVE mg/dL   Hgb urine dipstick TRACE (*) NEGATIVE   Bilirubin Urine NEGATIVE  NEGATIVE   Ketones, ur NEGATIVE  NEGATIVE mg/dL   Protein, ur NEGATIVE  NEGATIVE mg/dL   Urobilinogen, UA 0.2  0.0 - 1.0 mg/dL   Nitrite NEGATIVE  NEGATIVE   Leukocytes, UA NEGATIVE  NEGATIVE  URINE MICROSCOPIC-ADD ON      Result Value Range   Squamous Epithelial / LPF MANY (*) RARE   WBC, UA 0-2  <3 WBC/hpf   RBC / HPF 0-2  <3 RBC/hpf   Bacteria, UA FEW (*) RARE    1. Braxton Hicks contractions       MDM  Probable Braxton Hicks contractions. Urinalysis will be obtained to screen for UTI and she will be placed on a fetal monitor.  Monitor showed only 1 uterine contraction. Baby is very active and has not staying in one place long enough to get good tracing but no evidence of any fetal distress. She is discharged with reassurance.     Dione Booze, MD 03/24/13 [phone]

## 2013-03-24 NOTE — ED Notes (Signed)
[facility] and with EDP.  Pt will be discharged as no contractions have been noted, and pt not presently having any complaints.  Pt has tolerated crackers and juice with no difficulty.

## 2013-03-27 ENCOUNTER — Telehealth: Payer: Self-pay | Admitting: Obstetrics & Gynecology

## 2013-03-27 NOTE — Telephone Encounter (Signed)
Left message x 1. JSY 

## 2013-03-28 NOTE — Telephone Encounter (Signed)
Spoke with pt. Having diarrhea, gas, and heartburn. Went to ER over the weekend for this reason. Advised Imodium for diarrhea. Advised Maalox, Mylanta, Tums, or Rolaids for heartburn. Advised Gas X for gas. Also, can try Zantac for heartburn. Symptoms may not go away completely, but hopefully will improve. Advised to give it several days to see if this will help. To call us back if needed. Pt voiced understanding. JSY

## 2013-04-01 ENCOUNTER — Encounter: Payer: Self-pay | Admitting: Women's Health

## 2013-04-01 DIAGNOSIS — R87619 Unspecified abnormal cytological findings in specimens from cervix uteri: Secondary | ICD-10-CM | POA: Insufficient documentation

## 2013-04-09 ENCOUNTER — Encounter: Payer: Self-pay | Admitting: Obstetrics & Gynecology

## 2013-04-09 ENCOUNTER — Ambulatory Visit (INDEPENDENT_AMBULATORY_CARE_PROVIDER_SITE_OTHER): Payer: Medicaid Other | Admitting: Obstetrics & Gynecology

## 2013-04-09 VITALS — BP 110/70 | Wt 211.0 lb

## 2013-04-09 DIAGNOSIS — E669 Obesity, unspecified: Secondary | ICD-10-CM

## 2013-04-09 DIAGNOSIS — Z331 Pregnant state, incidental: Secondary | ICD-10-CM

## 2013-04-09 DIAGNOSIS — Z3403 Encounter for supervision of normal first pregnancy, third trimester: Secondary | ICD-10-CM

## 2013-04-09 DIAGNOSIS — Z1389 Encounter for screening for other disorder: Secondary | ICD-10-CM

## 2013-04-09 LAB — POCT URINALYSIS DIPSTICK
Blood, UA: NEGATIVE
Leukocytes, UA: NEGATIVE

## 2013-04-09 NOTE — Patient Instructions (Signed)
Epidural Risks and Benefits The continuous putting in (infusion) of local anesthetics through a long, narrow, hollow plastic tube (catheter)/needle into the lower (lumbar) area of your spine is commonly called an epidural. This means outside the covering of the spinal cord. The epidural catheter is placed in the space on the outside of the membrane that covers the spinal cord. The anesthetic medicine numbs the nerves of the spinal cord in the epidural space. There is also a spinal/epidural anesthetic using two needles and a catheter. The medication is first placed in the spinal canal. Then that needle is removed and a catheter is placed in the epidural space through the second needle for continuous anesthesia. This seems to be the most popular type of regional anesthesia used now. This is sometimes given for pain management to women who are giving birth. Spinal and epidural anesthesia are called regional anesthesia because they numb a certain region of the body. While it is an effective pain management tool, some reasons not to use this include:  Restricted mobility: The tubes and monitors connected to you do not allow for much moving around.  Increased likelihood of bladder catheterization, oxytocin administration, and internal monitoring. This means a tube (catheter) may have to be put into the bladder to drain the urine. Uterine contractions can become weaker and less frequent. They also may have a higher use of oxytocin than mothers not having regional anesthesia.  Increased likelihood of operative delivery: This includes the use of or need for forceps, vacuum extractor, episiotomy, or cesarean delivery. When the dose is too large, or when it sinks down into the "tailbone" (sacral) region of the body, the perineum and the birth canal (vagina) are anesthetized. Anesthetic is injected into this area late in labor to deaden all sensation. When it "accidentally" happens earlier in labor, the muscles of the  pelvic floor are relaxed too early. This interferes with the normal flexion and rotation of the baby's head as it passes through the birth canal. This interference can lead to abnormal presentations that are more dangerous for the baby.  Must use an automatic blood pressure cuff throughout labor. This is a cuff that automatically takes your blood pressure at regular intervals. SHORT TERM MATERNAL RISKS  Dural puncture - The dura is one of the membranes surrounding the spinal cord. If the anesthetic medication gets into the spinal canal through a dural puncture, it can result in a spinal anesthetic and spinal headache. Spinal headaches are treated with an epidural blood patch to cover the punctured area.  Low blood pressure (hypotension) - Nearly one third of women with an epidural will develop low blood pressure. The ways that patients must lay during the epidural can make this worse. Their position is limited because they will be unable to move their legs easily for the time of the anesthetic. Low blood pressure is also a risk for the baby. If the baby does not get enough oxygen from the mom's blood, it can result in an emergency Cesarean section. This means the baby is delivered by an operation through a cut by the surgeon (incision) on the belly of the mother.  Nausea, vomiting, and prolonged shivering.  Prolonged labor - With large doses of anesthetic medication, the patient loses the desire and the ability to bear down and push. This results in an increased use of forceps and vacuum extractions, compared to women having unmedicated deliveries.  Uneven, incomplete or non-existent pain relief. Sometimes the epidural does not work well and   additional medications may be needed for pain relief.  Difficulty breathing well or paralysis if the level of anesthesia goes too high in the spine.  Convulsions - If the anesthetic agent accidentally is injected into a blood vessel it can cause convulsions and  loss of consciousness.  Toxic drug reactions.  Septic meningitis - An abscess can form at the site where the epidural catheter is placed. If this spreads into the spinal canal it can cause meningitis.  Allergic reaction - This causes blood pressure to become too low and other medications and fluids must be given to bring the blood pressure up. Also rashes and difficulty breathing may develop.  Cardiac arrest - This is rare but real threat to the life of the mother and baby.  Fever is common.  Itching that is easily treated.  Spinal hematoma. LONG TERM MATERNAL RISKS  Neurological complications - A nerve problem called Horner's syndrome can develop with epidural anesthesia for vaginal delivery. It is impossible to predict which patients will develop a Horner's syndrome. Even the nerves to the face can be blocked, temporarily or permanently. Tremors and shakes can occur.  Paresthesia ("pins and needles"). This is a feeling that comes from inflammation of a nerve.  Dizziness and fainting can become a problem after epidurals. This is usually only for a couple of days. RISKS TO BABY  Direct drug toxicity.  Fetal distress, abnormal fetal heart rate (FHR) (can lead to emergency cesarean). This is especially true if the anesthetic gets into the mother's blood stream or too much medication is put into the epidural. REASONS NOT TO HAVE EPIDURAL ANESTHESIA  Increased costs.  The mother has a low blood pressure.  There are blood clotting problems.  A brain tumor is present.  There is an infection in the blood stream.  A skin infection at the needle site.  A tattoo at the needle site. BENEFITS  Regional anesthesia is the most effective pain relief for labor and delivery.  It is the best anesthetic for preeclampsia and eclampsia.  There is better pain control after delivery (vaginal or cesarean).  When done correctly, no medication gets to the baby.  Sooner ambulation after  delivery.  It can be left in place during all of labor.  You can be awake during a Cesarean delivery and see the baby immediately after delivery. AFTER THE PROCEDURE   You will be kept in bed for several hours to prevent headaches.  You will be kept in bed until your legs are no longer numb and it is safe to walk.  The length of time you spend in the hospital will depend on the type of surgery or procedure you have had.  The epidural catheter is removed after you no longer need it for pain. HOME CARE INSTRUCTIONS   Do not drive or operate any kind of machinery for at least 24 hours. Make sure there is someone to drive you home.  Do not drink alcohol for at least 24 hours after the anesthesia.  Do not make important decisions for at least 24 hours after the anesthesia.  Drink lots of fluids.  Return to your normal diet.  Keep all your postoperative appointments as scheduled. SEEK IMMEDIATE MEDICAL CARE IF:  You develop a fever or temperature over 98.6 F (37 C).  You have a persistent headache.  You develop dizziness, fainting or lightheadedness.  You develop weakness, numbness or tingling in your arms or legs.  You have a skin rash.  You   have difficulty breathing  You have a stiff neck with or without stiff back.  You develop chest pain. Document Released: 10/31/2005 Document Revised: 01/23/2012 Document Reviewed: 12/08/2008 [employer] Patient Information 2014 [employer].  

## 2013-04-09 NOTE — Progress Notes (Signed)
BP weight and urine results all reviewed and noted. Patient reports good fetal movement, denies any bleeding and no rupture of membranes symptoms or regular contractions. Patient is without complaints. All questions were answered.  

## 2013-04-17 ENCOUNTER — Telehealth: Payer: Self-pay | Admitting: Obstetrics & Gynecology

## 2013-04-17 ENCOUNTER — Ambulatory Visit (INDEPENDENT_AMBULATORY_CARE_PROVIDER_SITE_OTHER): Payer: BC Managed Care – PPO | Admitting: Advanced Practice Midwife

## 2013-04-17 ENCOUNTER — Encounter: Payer: Self-pay | Admitting: Advanced Practice Midwife

## 2013-04-17 VITALS — BP 110/60 | Wt 215.0 lb

## 2013-04-17 DIAGNOSIS — O2390 Unspecified genitourinary tract infection in pregnancy, unspecified trimester: Secondary | ICD-10-CM

## 2013-04-17 DIAGNOSIS — O0933 Supervision of pregnancy with insufficient antenatal care, third trimester: Secondary | ICD-10-CM

## 2013-04-17 DIAGNOSIS — Z1389 Encounter for screening for other disorder: Secondary | ICD-10-CM

## 2013-04-17 DIAGNOSIS — Z331 Pregnant state, incidental: Secondary | ICD-10-CM

## 2013-04-17 DIAGNOSIS — O99210 Obesity complicating pregnancy, unspecified trimester: Secondary | ICD-10-CM

## 2013-04-17 DIAGNOSIS — E669 Obesity, unspecified: Secondary | ICD-10-CM

## 2013-04-17 DIAGNOSIS — Z3403 Encounter for supervision of normal first pregnancy, third trimester: Secondary | ICD-10-CM

## 2013-04-17 DIAGNOSIS — O0930 Supervision of pregnancy with insufficient antenatal care, unspecified trimester: Secondary | ICD-10-CM | POA: Insufficient documentation

## 2013-04-17 LAB — POCT URINALYSIS DIPSTICK
Glucose, UA: NEGATIVE
Nitrite, UA: NEGATIVE

## 2013-04-17 NOTE — Progress Notes (Signed)
Pressure in vaginal area. Trouble sleeping. Vaginal discharge Saturday.  SSE:  Normal appearing discharge.  Wet prep negative. Pt reassured that all her c/o are normal for this stage in the pregnancy.  Comfort measures discussed.

## 2013-04-17 NOTE — Telephone Encounter (Signed)
Spoke with pt. Having trouble sleeping. Feels like baby is laying on one side and she wakes up having trouble breathing. To come in this am for eval.

## 2013-04-17 NOTE — Patient Instructions (Signed)

## 2013-04-22 ENCOUNTER — Telehealth: Payer: Self-pay | Admitting: Obstetrics and Gynecology

## 2013-04-22 ENCOUNTER — Encounter: Payer: Medicaid Other | Admitting: Obstetrics & Gynecology

## 2013-04-22 NOTE — Telephone Encounter (Addendum)
Spoke with pt. Has a note to come out of work on 05/13/13, but she is having lots of swelling in her feet and wants to come out 05/02/13. What do you advise? Has a scheduled appt on 04/30/13, but needs note this week. JSY    10:34 AM 04/22/13 Pt informed that she must have a medical reason to be taken out of work (disability, as well) but if she qualifies for FMLA, she can start maternity leave whenever she wishes.

## 2013-04-23 ENCOUNTER — Encounter: Payer: Self-pay | Admitting: Advanced Practice Midwife

## 2013-04-30 ENCOUNTER — Ambulatory Visit (INDEPENDENT_AMBULATORY_CARE_PROVIDER_SITE_OTHER): Payer: BC Managed Care – PPO | Admitting: Obstetrics & Gynecology

## 2013-04-30 ENCOUNTER — Encounter: Payer: Self-pay | Admitting: Obstetrics & Gynecology

## 2013-04-30 VITALS — BP 110/60 | Wt 213.0 lb

## 2013-04-30 DIAGNOSIS — Z1389 Encounter for screening for other disorder: Secondary | ICD-10-CM

## 2013-04-30 DIAGNOSIS — Z331 Pregnant state, incidental: Secondary | ICD-10-CM

## 2013-04-30 DIAGNOSIS — E669 Obesity, unspecified: Secondary | ICD-10-CM

## 2013-04-30 LAB — POCT URINALYSIS DIPSTICK
Glucose, UA: NEGATIVE
Leukocytes, UA: NEGATIVE

## 2013-04-30 NOTE — Progress Notes (Signed)
BP weight and urine results all reviewed and noted. Patient reports good fetal movement, denies any bleeding and no rupture of membranes symptoms or regular contractions. Patient is without complaints. All questions were answered.  

## 2013-04-30 NOTE — Progress Notes (Signed)
1 

## 2013-04-30 NOTE — Patient Instructions (Addendum)
Breastfeeding A change in hormones during your pregnancy causes growth of your breast tissue and an increase in number and size of milk ducts. The hormone prolactin allows proteins, sugars, and fats from your blood supply to make breast milk in your milk-producing glands. The hormone progesterone prevents breast milk from being released before the birth of your baby. After the birth of your baby, your progesterone level decreases allowing breast milk to be released. Thoughts of your baby, as well as his or her sucking or crying, can stimulate the release of milk from the milk-producing glands. Deciding to breastfeed (nurse) is one of the best choices you can make for you and your baby. The information that follows gives a brief review of the benefits, as well as other important skills to know about breastfeeding. BENEFITS OF BREASTFEEDING For your baby  The first milk (colostrum) helps your baby's digestive system function better.   There are antibodies in your milk that help your baby fight off infections.   Your baby has a lower incidence of asthma, allergies, and sudden infant death syndrome (SIDS).   The nutrients in breast milk are better for your baby than infant formulas.  Breast milk improves your baby's brain development.   Your baby will have less gas, colic, and constipation.  Your baby is less likely to develop other conditions, such as childhood obesity, asthma, or diabetes mellitus. For you  Breastfeeding helps develop a very special bond between you and your baby.   Breastfeeding is convenient, always available at the correct temperature, and costs nothing.   Breastfeeding helps to burn calories and helps you lose the weight gained during pregnancy.   Breastfeeding makes your uterus contract back down to normal size faster and slows bleeding following delivery.   Breastfeeding mothers have a lower risk of developing osteoporosis or breast or ovarian cancer later  in life.  BREASTFEEDING FREQUENCY  A healthy, full-term baby may breastfeed as often as every hour or space his or her feedings to every 3 hours. Breastfeeding frequency will vary from baby to baby.   Newborns should be fed no less than every 2 3 hours during the day and every 4 5 hours during the night. You should breastfeed a minimum of 8 feedings in a 24 hour period.  Awaken your baby to breastfeed if it has been 3 4 hours since the last feeding.  Breastfeed when you feel the need to reduce the fullness of your breasts or when your newborn shows signs of hunger. Signs that your baby may be hungry include:  Increased alertness or activity.  Stretching.  Movement of the head from side to side.  Movement of the head and opening of the mouth when the corner of the mouth or cheek is stroked (rooting).  Increased sucking sounds, smacking lips, cooing, sighing, or squeaking.  Hand-to-mouth movements.  Increased sucking of fingers or hands.  Fussing.  Intermittent crying.  Signs of extreme hunger will require calming and consoling before you try to feed your baby. Signs of extreme hunger may include:  Restlessness.  A loud, strong cry.  Screaming.  Frequent feeding will help you make more milk and will help prevent problems, such as sore nipples and engorgement of the breasts.  BREASTFEEDING   Whether lying down or sitting, be sure that the baby's abdomen is facing your abdomen.   Support your breast with 4 fingers under your breast and your thumb above your nipple. Make sure your fingers are well away from   your nipple and your baby's mouth.   Stroke your baby's lips gently with your finger or nipple.   When your baby's mouth is open wide enough, place all of your nipple and as much of the colored area around your nipple (areola) as possible into your baby's mouth.  More areola should be visible above his or her upper lip than below his or her lower lip.  Your  baby's tongue should be between his or her lower gum and your breast.  Ensure that your baby's mouth is correctly positioned around the nipple (latched). Your baby's lips should create a seal on your breast.  Signs that your baby has effectively latched onto your nipple include:  Tugging or sucking without pain.  Swallowing heard between sucks.  Absent click or smacking sound.  Muscle movement above and in front of his or her ears with sucking.  Your baby must suck about 2 3 minutes in order to get your milk. Allow your baby to feed on each breast as long as he or she wants. Nurse your baby until he or she unlatches or falls asleep at the first breast, then offer the second breast.  Signs that your baby is full and satisfied include:  A gradual decrease in the number of sucks or complete cessation of sucking.  Falling asleep.  Extension or relaxation of his or her body.  Retention of a small amount of milk in his or her mouth.  Letting go of your breast by himself or herself.  Signs of effective breastfeeding in you include:  Breasts that have increased firmness, weight, and size prior to feeding.  Breasts that are softer after nursing.  Increased milk volume, as well as a change in milk consistency and color by the 5th day of breastfeeding.  Breast fullness relieved by breastfeeding.  Nipples are not sore, cracked, or bleeding.  If needed, break the suction by putting your finger into the corner of your baby's mouth and sliding your finger between his or her gums. Then, remove your breast from his or her mouth.  It is common for babies to spit up a small amount after a feeding.  Babies often swallow air during feeding. This can make babies fussy. Burping your baby between breasts can help with this.  Vitamin D supplements are recommended for babies who get only breast milk.  Avoid using a pacifier during your baby's first 4 6 weeks.  Avoid supplemental feedings of  water, formula, or juice in place of breastfeeding. Breast milk is all the food your baby needs. It is not necessary for your baby to have water or formula. Your breasts will make more milk if supplemental feedings are avoided during the early weeks. HOW TO TELL WHETHER YOUR BABY IS GETTING ENOUGH BREAST MILK Wondering whether or not your baby is getting enough milk is a common concern among mothers. You can be assured that your baby is getting enough milk if:   Your baby is actively sucking and you hear swallowing.   Your baby seems relaxed and satisfied after a feeding.   Your baby nurses at least 8 12 times in a 24 hour time period.  During the first 3 5 days of age:  Your baby is wetting at least 3 5 diapers in a 24 hour period. The urine should be clear and pale yellow.  Your baby is having at least 3 4 stools in a 24 hour period. The stool should be soft and yellow.  At   5 7 days of age, your baby is having at least 3 6 stools in a 24 hour period. The stool should be seedy and yellow by 5 days of age.  Your baby has a weight loss less than 7 10% during the first 3 days of age.  Your baby does not lose weight after 3 7 days of age.  Your baby gains 4 7 ounces each week after he or she is 4 days of age.  Your baby gains weight by 5 days of age and is back to birth weight within 2 weeks. ENGORGEMENT In the first week after your baby is born, you may experience extremely full breasts (engorgement). When engorged, your breasts may feel heavy, warm, or tender to the touch. Engorgement peaks within 24 48 hours after delivery of your baby.  Engorgement may be reduced by:  Continuing to breastfeed.  Increasing the frequency of breastfeeding.  Taking warm showers or applying warm, moist heat to your breasts just before each feeding. This increases circulation and helps the milk flow.   Gently massaging your breast before and during the feedings. With your fingertips, massage from  your chest wall towards your nipple in a circular motion.   Ensuring that your baby empties at least one breast at every feeding. It also helps to start the next feeding on the opposite breast.   Expressing breast milk by hand or by using a breast pump to empty the breasts if your baby is sleepy, or not nursing well. You may also want to express milk if you are returning to work oryou feel you are getting engorged.  Ensuring your baby is latched on and positioned properly while breastfeeding. If you follow these suggestions, your engorgement should improve in 24 48 hours. If you are still experiencing difficulty, call your lactation consultant or caregiver.  CARING FOR YOURSELF Take care of your breasts.  Bathe or shower daily.   Avoid using soap on your nipples.   Wear a supportive bra. Avoid wearing underwire style bras.  Air dry your nipples for a 3 4minutes after each feeding.   Use only cotton bra pads to absorb breast milk leakage. Leaking of breast milk between feedings is normal.   Use only pure lanolin on your nipples after nursing. You do not need to wash it off before feeding your baby again. Another option is to express a few drops of breast milk and gently massage that milk into your nipples.  Continue breast self-awareness checks. Take care of yourself.  Eat healthy foods. Alternate 3 meals with 3 snacks.  Avoid foods that you notice affect your baby in a bad way.  Drink milk, fruit juice, and water to satisfy your thirst (about 8 glasses a day).   Rest often, relax, and take your prenatal vitamins to prevent fatigue, stress, and anemia.  Avoid chewing and smoking tobacco.  Avoid alcohol and drug use.  Take over-the-counter and prescribed medicine only as directed by your caregiver or pharmacist. You should always check with your caregiver or pharmacist before taking any new medicine, vitamin, or herbal supplement.  Know that pregnancy is possible while  breastfeeding. If desired, talk to your caregiver about family planning and safe birth control methods that may be used while breastfeeding. SEEK MEDICAL CARE IF:   You feel like you want to stop breastfeeding or have become frustrated with breastfeeding.  You have painful breasts or nipples.  Your nipples are cracked or bleeding.  Your breasts are red, tender,   or warm.  You have a swollen area on either breast.  You have a fever or chills.  You have nausea or vomiting.  You have drainage from your nipples.  Your breasts do not become full before feedings by the 5th day after delivery.  You feel sad and depressed.  Your baby is too sleepy to eat well.  Your baby is having trouble sleeping.   Your baby is wetting less than 3 diapers in a 24 hour period.  Your baby has less than 3 stools in a 24 hour period.  Your baby's skin or the white part of his or her eyes becomes more yellow.   Your baby is not gaining weight by 5 days of age. MAKE SURE YOU:   Understand these instructions.  Will watch your condition.  Will get help right away if you are not doing well or get worse. Document Released: 10/31/2005 Document Revised: 07/25/2012 Document Reviewed: 06/06/2012 [employer] Patient Information 2014 [employer].  

## 2013-05-07 ENCOUNTER — Ambulatory Visit (INDEPENDENT_AMBULATORY_CARE_PROVIDER_SITE_OTHER): Payer: BC Managed Care – PPO | Admitting: Women's Health

## 2013-05-07 ENCOUNTER — Encounter: Payer: Self-pay | Admitting: Women's Health

## 2013-05-07 VITALS — BP 122/70 | Wt 211.0 lb

## 2013-05-07 DIAGNOSIS — Z1389 Encounter for screening for other disorder: Secondary | ICD-10-CM

## 2013-05-07 DIAGNOSIS — Z3403 Encounter for supervision of normal first pregnancy, third trimester: Secondary | ICD-10-CM

## 2013-05-07 DIAGNOSIS — Z331 Pregnant state, incidental: Secondary | ICD-10-CM

## 2013-05-07 DIAGNOSIS — E669 Obesity, unspecified: Secondary | ICD-10-CM

## 2013-05-07 DIAGNOSIS — O2390 Unspecified genitourinary tract infection in pregnancy, unspecified trimester: Secondary | ICD-10-CM

## 2013-05-07 DIAGNOSIS — N898 Other specified noninflammatory disorders of vagina: Secondary | ICD-10-CM

## 2013-05-07 DIAGNOSIS — O99210 Obesity complicating pregnancy, unspecified trimester: Secondary | ICD-10-CM

## 2013-05-07 DIAGNOSIS — R809 Proteinuria, unspecified: Secondary | ICD-10-CM

## 2013-05-07 LAB — POCT URINALYSIS DIPSTICK

## 2013-05-07 LAB — POCT WET PREP (WET MOUNT)

## 2013-05-07 NOTE — Progress Notes (Signed)
Reports good fm. Denies uc's, lof, vb, urinary frequency, urgency, hesitancy, or dysuria.  Milky discharge x few weeks. Fishy odor once this weekend, none since, denies vulvovaginal itching/irritation. Large amount milky non-odorous d/c, wet prep neg.  Reviewed ptl s/s, fetal kick counts.  GBS, gc/ch today. All questions answered. F/U in 1wk for visit.

## 2013-05-07 NOTE — Progress Notes (Signed)
Pressure sometimes. Milky discharge.

## 2013-05-07 NOTE — Patient Instructions (Signed)

## 2013-05-08 LAB — URINALYSIS
Bilirubin Urine: NEGATIVE
Glucose, UA: NEGATIVE mg/dL
Hgb urine dipstick: NEGATIVE
Ketones, ur: 40 mg/dL — AB
Protein, ur: 100 mg/dL — AB
pH: 7.5 (ref 5.0–8.0)

## 2013-05-08 LAB — GC/CHLAMYDIA PROBE AMP: CT Probe RNA: NEGATIVE

## 2013-05-09 LAB — URINE CULTURE

## 2013-05-10 ENCOUNTER — Inpatient Hospital Stay (HOSPITAL_COMMUNITY)
Admission: AD | Admit: 2013-05-10 | Discharge: 2013-05-10 | Disposition: A | Discharge status: Home / Self Care | Payer: Medicaid Other | Source: Ambulatory Visit | Attending: Obstetrics & Gynecology | Admitting: Obstetrics & Gynecology

## 2013-05-10 ENCOUNTER — Encounter (HOSPITAL_COMMUNITY): Payer: Self-pay | Admitting: *Deleted

## 2013-05-10 DIAGNOSIS — O479 False labor, unspecified: Secondary | ICD-10-CM | POA: Insufficient documentation

## 2013-05-10 DIAGNOSIS — O0933 Supervision of pregnancy with insufficient antenatal care, third trimester: Secondary | ICD-10-CM

## 2013-05-10 DIAGNOSIS — Z3403 Encounter for supervision of normal first pregnancy, third trimester: Secondary | ICD-10-CM

## 2013-05-10 LAB — CULTURE, BETA STREP (GROUP B ONLY)

## 2013-05-10 NOTE — Discharge Instructions (Signed)

## 2013-05-10 NOTE — MAU Note (Signed)
PT ARRIVED VIA EMS  SAYING AFTER EATING  AT 8 PM- SHE FELT PRESSURE  AND UC.Marland Kitchen  [facility]-  ON Tuesday- VE  CLOSED-  CX DONE.   DENIES HSV AND MRSA.

## 2013-05-11 ENCOUNTER — Encounter: Payer: Self-pay | Admitting: Women's Health

## 2013-05-11 LAB — OB RESULTS CONSOLE GBS: GBS: NEGATIVE

## 2013-05-14 ENCOUNTER — Ambulatory Visit (INDEPENDENT_AMBULATORY_CARE_PROVIDER_SITE_OTHER): Payer: Medicaid Other | Admitting: Obstetrics & Gynecology

## 2013-05-14 VITALS — BP 118/68 | Wt 216.0 lb

## 2013-05-14 DIAGNOSIS — Z1389 Encounter for screening for other disorder: Secondary | ICD-10-CM

## 2013-05-14 DIAGNOSIS — Z331 Pregnant state, incidental: Secondary | ICD-10-CM

## 2013-05-14 DIAGNOSIS — Z3403 Encounter for supervision of normal first pregnancy, third trimester: Secondary | ICD-10-CM

## 2013-05-14 DIAGNOSIS — E669 Obesity, unspecified: Secondary | ICD-10-CM

## 2013-05-14 LAB — POCT URINALYSIS DIPSTICK
Glucose, UA: NEGATIVE
Ketones, UA: NEGATIVE
Leukocytes, UA: NEGATIVE

## 2013-05-14 NOTE — Progress Notes (Signed)
Pt states went [facility] for Braxton Hicks contractions, pt states told "uterus was tilted" requesting cervical exam today.

## 2013-05-14 NOTE — Patient Instructions (Signed)
Epidural Risks and Benefits The continuous putting in (infusion) of local anesthetics through a long, narrow, hollow plastic tube (catheter)/needle into the lower (lumbar) area of your spine is commonly called an epidural. This means outside the covering of the spinal cord. The epidural catheter is placed in the space on the outside of the membrane that covers the spinal cord. The anesthetic medicine numbs the nerves of the spinal cord in the epidural space. There is also a spinal/epidural anesthetic using two needles and a catheter. The medication is first placed in the spinal canal. Then that needle is removed and a catheter is placed in the epidural space through the second needle for continuous anesthesia. This seems to be the most popular type of regional anesthesia used now. This is sometimes given for pain management to women who are giving birth. Spinal and epidural anesthesia are called regional anesthesia because they numb a certain region of the body. While it is an effective pain management tool, some reasons not to use this include:  Restricted mobility: The tubes and monitors connected to you do not allow for much moving around.  Increased likelihood of bladder catheterization, oxytocin administration, and internal monitoring. This means a tube (catheter) may have to be put into the bladder to drain the urine. Uterine contractions can become weaker and less frequent. They also may have a higher use of oxytocin than mothers not having regional anesthesia.  Increased likelihood of operative delivery: This includes the use of or need for forceps, vacuum extractor, episiotomy, or cesarean delivery. When the dose is too large, or when it sinks down into the "tailbone" (sacral) region of the body, the perineum and the birth canal (vagina) are anesthetized. Anesthetic is injected into this area late in labor to deaden all sensation. When it "accidentally" happens earlier in labor, the muscles of the  pelvic floor are relaxed too early. This interferes with the normal flexion and rotation of the baby's head as it passes through the birth canal. This interference can lead to abnormal presentations that are more dangerous for the baby.  Must use an automatic blood pressure cuff throughout labor. This is a cuff that automatically takes your blood pressure at regular intervals. SHORT TERM MATERNAL RISKS  Dural puncture - The dura is one of the membranes surrounding the spinal cord. If the anesthetic medication gets into the spinal canal through a dural puncture, it can result in a spinal anesthetic and spinal headache. Spinal headaches are treated with an epidural blood patch to cover the punctured area.  Low blood pressure (hypotension) - Nearly one third of women with an epidural will develop low blood pressure. The ways that patients must lay during the epidural can make this worse. Their position is limited because they will be unable to move their legs easily for the time of the anesthetic. Low blood pressure is also a risk for the baby. If the baby does not get enough oxygen from the mom's blood, it can result in an emergency Cesarean section. This means the baby is delivered by an operation through a cut by the surgeon (incision) on the belly of the mother.  Nausea, vomiting, and prolonged shivering.  Prolonged labor - With large doses of anesthetic medication, the patient loses the desire and the ability to bear down and push. This results in an increased use of forceps and vacuum extractions, compared to women having unmedicated deliveries.  Uneven, incomplete or non-existent pain relief. Sometimes the epidural does not work well and   additional medications may be needed for pain relief.  Difficulty breathing well or paralysis if the level of anesthesia goes too high in the spine.  Convulsions - If the anesthetic agent accidentally is injected into a blood vessel it can cause convulsions and  loss of consciousness.  Toxic drug reactions.  Septic meningitis - An abscess can form at the site where the epidural catheter is placed. If this spreads into the spinal canal it can cause meningitis.  Allergic reaction - This causes blood pressure to become too low and other medications and fluids must be given to bring the blood pressure up. Also rashes and difficulty breathing may develop.  Cardiac arrest - This is rare but real threat to the life of the mother and baby.  Fever is common.  Itching that is easily treated.  Spinal hematoma. LONG TERM MATERNAL RISKS  Neurological complications - A nerve problem called Horner's syndrome can develop with epidural anesthesia for vaginal delivery. It is impossible to predict which patients will develop a Horner's syndrome. Even the nerves to the face can be blocked, temporarily or permanently. Tremors and shakes can occur.  Paresthesia ("pins and needles"). This is a feeling that comes from inflammation of a nerve.  Dizziness and fainting can become a problem after epidurals. This is usually only for a couple of days. RISKS TO BABY  Direct drug toxicity.  Fetal distress, abnormal fetal heart rate (FHR) (can lead to emergency cesarean). This is especially true if the anesthetic gets into the mother's blood stream or too much medication is put into the epidural. REASONS NOT TO HAVE EPIDURAL ANESTHESIA  Increased costs.  The mother has a low blood pressure.  There are blood clotting problems.  A brain tumor is present.  There is an infection in the blood stream.  A skin infection at the needle site.  A tattoo at the needle site. BENEFITS  Regional anesthesia is the most effective pain relief for labor and delivery.  It is the best anesthetic for preeclampsia and eclampsia.  There is better pain control after delivery (vaginal or cesarean).  When done correctly, no medication gets to the baby.  Sooner ambulation after  delivery.  It can be left in place during all of labor.  You can be awake during a Cesarean delivery and see the baby immediately after delivery. AFTER THE PROCEDURE   You will be kept in bed for several hours to prevent headaches.  You will be kept in bed until your legs are no longer numb and it is safe to walk.  The length of time you spend in the hospital will depend on the type of surgery or procedure you have had.  The epidural catheter is removed after you no longer need it for pain. HOME CARE INSTRUCTIONS   Do not drive or operate any kind of machinery for at least 24 hours. Make sure there is someone to drive you home.  Do not drink alcohol for at least 24 hours after the anesthesia.  Do not make important decisions for at least 24 hours after the anesthesia.  Drink lots of fluids.  Return to your normal diet.  Keep all your postoperative appointments as scheduled. SEEK IMMEDIATE MEDICAL CARE IF:  You develop a fever or temperature over 98.6 F (37 C).  You have a persistent headache.  You develop dizziness, fainting or lightheadedness.  You develop weakness, numbness or tingling in your arms or legs.  You have a skin rash.  You   have difficulty breathing  You have a stiff neck with or without stiff back.  You develop chest pain. Document Released: 10/31/2005 Document Revised: 01/23/2012 Document Reviewed: 12/08/2008 [employer] Patient Information 2014 [employer].  

## 2013-05-14 NOTE — Progress Notes (Signed)
BP weight and urine results all reviewed and noted. Patient reports good fetal movement, denies any bleeding and no rupture of membranes symptoms or regular contractions. Patient is without complaints. All questions were answered.  

## 2013-05-20 ENCOUNTER — Encounter (HOSPITAL_COMMUNITY): Payer: Self-pay | Admitting: *Deleted

## 2013-05-20 ENCOUNTER — Inpatient Hospital Stay (HOSPITAL_COMMUNITY)
Admission: AD | Admit: 2013-05-20 | Discharge: 2013-05-20 | Disposition: A | Discharge status: Home / Self Care | Payer: Medicaid Other | Source: Ambulatory Visit | Attending: Obstetrics & Gynecology | Admitting: Obstetrics & Gynecology

## 2013-05-20 ENCOUNTER — Telehealth: Payer: Self-pay | Admitting: Obstetrics and Gynecology

## 2013-05-20 DIAGNOSIS — O479 False labor, unspecified: Secondary | ICD-10-CM | POA: Insufficient documentation

## 2013-05-20 DIAGNOSIS — O0933 Supervision of pregnancy with insufficient antenatal care, third trimester: Secondary | ICD-10-CM

## 2013-05-20 DIAGNOSIS — Z3403 Encounter for supervision of normal first pregnancy, third trimester: Secondary | ICD-10-CM

## 2013-05-20 DIAGNOSIS — M545 Low back pain, unspecified: Secondary | ICD-10-CM | POA: Insufficient documentation

## 2013-05-20 LAB — URINALYSIS, ROUTINE W REFLEX MICROSCOPIC
Bilirubin Urine: NEGATIVE
Ketones, ur: NEGATIVE mg/dL
Leukocytes, UA: NEGATIVE
Nitrite: NEGATIVE
Specific Gravity, Urine: 1.015 (ref 1.005–1.030)
Urobilinogen, UA: 0.2 mg/dL (ref 0.0–1.0)
pH: 7.5 (ref 5.0–8.0)

## 2013-05-20 NOTE — MAU Provider Note (Signed)
  History     CSN: [id number]  Arrival date and time: 05/20/13 1517   None     Chief Complaint  Patient presents with  . Back Pain   HPI This is a 27 y.o. female at [redacted]w[redacted]d who presents with c/o low back pain that comes and goes. Denies leaking or bleeding. Good FM. Seems unaware of contractions on monitor.  RN Note: Low back pain started on Friday, has increased over the weekend. Feeling pelvic pressure. Noted milky d/c last night    OB History   Grav Para Term Preterm Abortions TAB SAB Ect Mult Living   1               Past Medical History  Diagnosis Date  . Pregnancy   . Obesity   . GERD (gastroesophageal reflux disease)   . Abnormal Pap smear     Past Surgical History  Procedure Laterality Date  . No past surgeries      Family History  Problem Relation Age of Onset  . Kidney disease Mother   . Heart disease Mother   . Cancer Father     metastatic prostate   . Hypertension Other     History  Substance Use Topics  . Smoking status: Never Smoker   . Smokeless tobacco: Not on file  . Alcohol Use: No    Allergies:  Allergies  Allergen Reactions  . Penicillins Nausea And Vomiting    Prescriptions prior to admission  Medication Sig Dispense Refill  . acetaminophen (TYLENOL) 500 MG tablet Take 500 mg by mouth every 6 (six) hours as needed. For headache or pain      . omeprazole (PRILOSEC) 20 MG capsule Take 20 mg by mouth daily.      . Prenatal Vit-Fe Fumarate-FA (PRENATAL MULTIVITAMIN) TABS Take 1 tablet by mouth at bedtime.         Review of Systems  Constitutional: Positive for malaise/fatigue. Negative for fever and chills.  Gastrointestinal: Negative for nausea, vomiting, abdominal pain, diarrhea and constipation.  Musculoskeletal: Positive for back pain.  Neurological: Negative for dizziness.   Physical Exam   Blood pressure 124/68, pulse 117, temperature 98.3 F (36.8 C), temperature source Oral, resp. rate 20, height 5\' 4"  (1.626 m), weight  97.07 kg (214 lb), last menstrual period 08/24/2012.  Physical Exam  Constitutional: She is oriented to person, place, and time. She appears well-developed and well-nourished. No distress.  HENT:  Head: Normocephalic.  Cardiovascular: Normal rate.   Respiratory: Effort normal.  GI: Soft. She exhibits no distension. There is no tenderness. There is no rebound and no guarding.  Genitourinary: Vagina normal and uterus normal. No vaginal discharge found.  Musculoskeletal: Normal range of motion.  Neurological: She is alert and oriented to person, place, and time.  Skin: Skin is warm and dry.  Psychiatric: She has a normal mood and affect.  Dilation: Closed Effacement (%): 50 Station: -3 Presentation: Vertex Exam by:: Artelia Laroche CNM FHR reactive Irregular  Mild contractions every 7-10 min   MAU Course  Procedures  MDM Will recheck in one hour  >>  RN cervix check: Dilation: Closed Effacement (%): 50 Station: -3 Presentation: Vertex Exam by:: Raliegh Ip RN   Assessment and Plan  A:  [facility] at [redacted]w[redacted]d       Prodromal contractions  P:  Discharge home       Labor precautions  [facility] 05/20/2013, 4:04 PM

## 2013-05-20 NOTE — Telephone Encounter (Signed)
Pt c/o lower back pain started last night, "so bad could hardly walk." + FM, no gush of fluids, no vaginal bleeding. Pt told to go to [facility] to be evaluated. Pt verbalized understanding.

## 2013-05-20 NOTE — MAU Note (Signed)
Low back pain started on Friday, has increased over the weekend.  Feeling pelvic pressure.  Noted milky d/c last night.

## 2013-05-21 ENCOUNTER — Ambulatory Visit (INDEPENDENT_AMBULATORY_CARE_PROVIDER_SITE_OTHER): Payer: Medicaid Other | Admitting: Women's Health

## 2013-05-21 ENCOUNTER — Encounter: Payer: Self-pay | Admitting: Women's Health

## 2013-05-21 VITALS — BP 120/70 | Wt 214.4 lb

## 2013-05-21 DIAGNOSIS — E669 Obesity, unspecified: Secondary | ICD-10-CM

## 2013-05-21 DIAGNOSIS — K219 Gastro-esophageal reflux disease without esophagitis: Secondary | ICD-10-CM

## 2013-05-21 DIAGNOSIS — Z1389 Encounter for screening for other disorder: Secondary | ICD-10-CM

## 2013-05-21 DIAGNOSIS — Z3403 Encounter for supervision of normal first pregnancy, third trimester: Secondary | ICD-10-CM

## 2013-05-21 DIAGNOSIS — Z331 Pregnant state, incidental: Secondary | ICD-10-CM

## 2013-05-21 LAB — POCT URINALYSIS DIPSTICK
Glucose, UA: NEGATIVE
Nitrite, UA: NEGATIVE

## 2013-05-21 MED ORDER — OMEPRAZOLE 20 MG PO CPDR: 20.0000 mg | DELAYED_RELEASE_CAPSULE | Freq: Every day | ORAL | Status: DC

## 2013-05-21 NOTE — Progress Notes (Signed)
Reports good fm. Denies uc's, lof, vb, urinary frequency, urgency, hesitancy, or dysuria.  Has been having intermittent back pain, ?uc's vs. LBP- reviewed relief measures.  Reviewed labor s/s, fetal kick counts.  All questions answered. F/U in 1wk for visit.

## 2013-05-21 NOTE — Progress Notes (Signed)
Pressure yesterday. Low backpain

## 2013-05-21 NOTE — Patient Instructions (Signed)

## 2013-05-22 NOTE — MAU Provider Note (Signed)

## 2013-05-28 ENCOUNTER — Ambulatory Visit (INDEPENDENT_AMBULATORY_CARE_PROVIDER_SITE_OTHER): Payer: Medicaid Other | Admitting: Women's Health

## 2013-05-28 ENCOUNTER — Encounter: Payer: Self-pay | Admitting: Women's Health

## 2013-05-28 VITALS — BP 118/80 | Wt 215.2 lb

## 2013-05-28 DIAGNOSIS — Z1389 Encounter for screening for other disorder: Secondary | ICD-10-CM

## 2013-05-28 DIAGNOSIS — Z331 Pregnant state, incidental: Secondary | ICD-10-CM

## 2013-05-28 DIAGNOSIS — Z3403 Encounter for supervision of normal first pregnancy, third trimester: Secondary | ICD-10-CM

## 2013-05-28 DIAGNOSIS — O1213 Gestational proteinuria, third trimester: Secondary | ICD-10-CM

## 2013-05-28 DIAGNOSIS — O09219 Supervision of pregnancy with history of pre-term labor, unspecified trimester: Secondary | ICD-10-CM

## 2013-05-28 LAB — COMPREHENSIVE METABOLIC PANEL
ALT: 11 U/L (ref 0–35)
AST: 18 U/L (ref 0–37)
CO2: 24 mEq/L (ref 19–32)
Calcium: 9.4 mg/dL (ref 8.4–10.5)
Chloride: 103 mEq/L (ref 96–112)
Sodium: 134 mEq/L — ABNORMAL LOW (ref 135–145)
Total Protein: 6.4 g/dL (ref 6.0–8.3)

## 2013-05-28 LAB — POCT URINALYSIS DIPSTICK
Glucose, UA: NEGATIVE
Leukocytes, UA: NEGATIVE
Nitrite, UA: NEGATIVE

## 2013-05-28 LAB — CBC
Platelets: 126 10*3/uL — ABNORMAL LOW (ref 150–400)
RBC: 4.15 MIL/uL (ref 3.87–5.11)
WBC: 11.4 10*3/uL — ABNORMAL HIGH (ref 4.0–10.5)

## 2013-05-28 NOTE — Progress Notes (Signed)
Pressure and low backpain.

## 2013-05-28 NOTE — Patient Instructions (Signed)
Preeclampsia and Eclampsia  Preeclampsia is a condition of high blood pressure during pregnancy. It can happen at 20 weeks or later in pregnancy. If high blood pressure occurs in the second half of pregnancy with no other symptoms, it is called gestational hypertension and goes away after the baby is born. If any of the symptoms listed below develop with gestational hypertension, it is then called preeclampsia. Eclampsia (convulsions) may follow preeclampsia. This is one of the reasons for regular prenatal checkups. Early diagnosis and treatment are very important to prevent eclampsia.  CAUSES   There is no known cause of preeclampsia/eclampsia in pregnancy. There are several known conditions that may put the pregnant woman at risk, such as:  · The first pregnancy.  · Having preeclampsia in a past pregnancy.  · Having lasting (chronic) high blood pressure.  · Having multiples (twins, triplets).  · Being age 35 or older.  · African American ethnic background.  · Having kidney disease or diabetes.  · Medical conditions such as lupus or blood diseases.  · Being overweight (obese).  SYMPTOMS   · High blood pressure.  · Headaches.  · Sudden weight gain.  · Swelling of hands, face, legs, and feet.  · Protein in the urine.  · Feeling sick to your stomach (nauseous) and throwing up (vomiting).  · Vision problems (blurred or double vision).  · Numbness in the face, arms, legs, and feet.  · Dizziness.  · Slurred speech.  · Preeclampsia can cause growth retardation in the fetus.  · Separation (abruption) of the placenta.  · Not enough fluid in the amniotic sac (oligohydramnios).  · Sensitivity to bright lights.  · Belly (abdominal) pain.  DIAGNOSIS   If protein is found in the urine in the second half of pregnancy, this is considered preeclampsia. Other symptoms mentioned above may also be present.  TREATMENT   It is necessary to treat this.  · Your caregiver may prescribe bed rest early in this condition. Plenty of rest and  salt restriction may be all that is needed.  · Medicines may be necessary to lower blood pressure if the condition does not respond to more conservative measures.  · In more severe cases, hospitalization may be needed:  · For treatment of blood pressure.  · To control fluid retention.  · To monitor the baby to see if the condition is causing harm to the baby.  · Hospitalization is the best way to treat the first sign of preeclampsia. This is so the mother and baby can be watched closely and blood tests can be done effectively and correctly.  · If the condition becomes severe, it may be necessary to induce labor or to remove the infant by surgical means (cesarean section). The best cure for preeclampsia/eclampsia is to deliver the baby.  Preeclampsia and eclampsia involve risks to mother and infant. Your caregiver will discuss these risks with you. Together, you can work out the best possible approach to your problems. Make sure you keep your prenatal visits as scheduled. Not keeping appointments could result in a chronic or permanent injury, pain, disability to you, and death or injury to you or your unborn baby. If there is any problem keeping the appointment, you must call to reschedule.  HOME CARE INSTRUCTIONS   · Keep your prenatal appointments and tests as scheduled.  · Tell your caregiver if you have any of the above risk factors.  · Get plenty of rest and sleep.  · Eat a balanced   diet that is low in salt, and do not add salt to your food.  · Avoid stressful situations.  · Only take over-the-counter and prescriptions medicines for pain, discomfort, or fever as directed by your caregiver.  SEEK IMMEDIATE MEDICAL CARE IF:   · You develop severe swelling anywhere in the body. This usually occurs in the legs.  · You gain 5 lb/2.3 kg or more in a week.  · You develop a severe headache, dizziness, problems with your vision, or confusion.  · You have abdominal pain, nausea, or vomiting.  · You have a seizure.  · You  have trouble moving any part of your body, or you develop numbness or problems speaking.  · You have bruising or abnormal bleeding from anywhere in the body.  · You develop a stiff neck.  · You pass out.  MAKE SURE YOU:   · Understand these instructions.  · Will watch your condition.  · Will get help right away if you are not doing well or get worse.  Document Released: 10/28/2000 Document Revised: 01/23/2012 Document Reviewed: 06/13/2008  ExitCare® Patient Information ©2014 [employer].

## 2013-05-28 NOTE — Progress Notes (Signed)
Reports good fm. Denies uc's, lof, vb, urinary frequency, urgency, hesitancy, or dysuria.  Denies scotomata, ruq/epigastric pain, vomting. Did have HA over weekend that was r/b apap, some nausea over weekend. DTRs 2+, trace edema, no clonus. Will get pre-e labs given 2+ proteinuria.  Reviewed labor s/s, pre-e s/s, fetal kick counts.  All questions answered. F/U in 1wk for NST and visit.

## 2013-05-29 LAB — PROTEIN / CREATININE RATIO, URINE: Creatinine, Urine: 528.3 mg/dL

## 2013-05-30 ENCOUNTER — Inpatient Hospital Stay (HOSPITAL_COMMUNITY)
Admission: AD | Admit: 2013-05-30 | Discharge: 2013-05-30 | Disposition: A | Discharge status: Home / Self Care | Payer: Medicaid Other | Source: Ambulatory Visit | Attending: Obstetrics & Gynecology | Admitting: Obstetrics & Gynecology

## 2013-05-30 ENCOUNTER — Encounter: Payer: Self-pay | Admitting: Women's Health

## 2013-05-30 ENCOUNTER — Encounter (HOSPITAL_COMMUNITY): Payer: Self-pay | Admitting: *Deleted

## 2013-05-30 DIAGNOSIS — Z3403 Encounter for supervision of normal first pregnancy, third trimester: Secondary | ICD-10-CM

## 2013-05-30 DIAGNOSIS — O0933 Supervision of pregnancy with insufficient antenatal care, third trimester: Secondary | ICD-10-CM

## 2013-05-30 DIAGNOSIS — O1213 Gestational proteinuria, third trimester: Secondary | ICD-10-CM | POA: Insufficient documentation

## 2013-05-30 DIAGNOSIS — O479 False labor, unspecified: Secondary | ICD-10-CM

## 2013-05-30 DIAGNOSIS — N859 Noninflammatory disorder of uterus, unspecified: Secondary | ICD-10-CM

## 2013-05-30 NOTE — MAU Note (Signed)
Pt reports having back pain and menstral like cramping. C/o whit milky discharge and diarrhea as well Swelling and headache.

## 2013-05-30 NOTE — MAU Provider Note (Signed)
History     CSN: [id number]  Arrival date and time: 05/30/13 1641   First Provider Initiated Contact with Patient 05/30/13 1749      Chief Complaint  Patient presents with  . Labor Eval   HPI This is a 27 y.o. female at [redacted]w[redacted]d who presents for labor evaluation. Has multiple somatic complaints and "wants to have this baby".  Denies leaking or bleeding. + fetal movement.   RN Note: Pt reports having back pain and menstral like cramping. C/o whit milky discharge and diarrhea as well Swelling and headache  OB History   Grav Para Term Preterm Abortions TAB SAB Ect Mult Living   1               Past Medical History  Diagnosis Date  . Pregnancy   . Obesity   . GERD (gastroesophageal reflux disease)   . Abnormal Pap smear     Past Surgical History  Procedure Laterality Date  . No past surgeries      Family History  Problem Relation Age of Onset  . Kidney disease Mother   . Heart disease Mother   . Cancer Father     metastatic prostate   . Hypertension Other     History  Substance Use Topics  . Smoking status: Never Smoker   . Smokeless tobacco: Not on file  . Alcohol Use: No    Allergies:  Allergies  Allergen Reactions  . Penicillins Nausea And Vomiting    Prescriptions prior to admission  Medication Sig Dispense Refill  . acetaminophen (TYLENOL) 500 MG tablet Take 500 mg by mouth every 6 (six) hours as needed. For headache or pain      . omeprazole (PRILOSEC) 20 MG capsule Take 1 capsule (20 mg total) by mouth daily.  30 capsule  3  . Prenatal Vit-Fe Fumarate-FA (PRENATAL MULTIVITAMIN) TABS Take 1 tablet by mouth at bedtime.         Review of Systems  Constitutional: Negative for fever, chills and malaise/fatigue.  Gastrointestinal: Positive for abdominal pain and diarrhea. Negative for nausea, vomiting and constipation.  Genitourinary: Negative for dysuria.  Neurological: Negative for dizziness and headaches.   Physical Exam   Blood pressure  127/68, pulse 106, temperature 98.7 F (37.1 C), temperature source Oral, resp. rate 18, height 5\' 4"  (1.626 m), weight 97.523 kg (215 lb), last menstrual period 08/24/2012.  Physical Exam  Constitutional: She is oriented to person, place, and time. She appears well-developed and well-nourished. No distress (Does not appear uncomfortable at all with contractions).  Cardiovascular: Normal rate.   Respiratory: Effort normal.  GI: Soft. She exhibits no distension. There is no tenderness. There is no rebound and no guarding.  Genitourinary: Vagina normal and uterus normal. No vaginal discharge found.  Dilation: Fingertip Effacement (%): 60 Station: -3 Presentation: Vertex Exam by:: M.Barrett Holthaus,CNM   Musculoskeletal: Normal range of motion.  Neurological: She is alert and oriented to person, place, and time.  Skin: Skin is warm and dry.  Psychiatric: She has a normal mood and affect.   Recheck an hour later >>  Fingertip/70/-2/vtx  >> membranes swept  MAU Course  Procedures   Assessment and Plan  A:  SIUP at [redacted]w[redacted]d       Uterine irritability      No appreciable cervical change  P:  Discharge home       Reviewed signs of labor      Followup in office as scheduled  [facility] 05/30/2013, 6:32 PM

## 2013-06-03 ENCOUNTER — Ambulatory Visit (INDEPENDENT_AMBULATORY_CARE_PROVIDER_SITE_OTHER): Payer: BC Managed Care – PPO | Admitting: Obstetrics & Gynecology

## 2013-06-03 ENCOUNTER — Encounter: Payer: Self-pay | Admitting: Obstetrics & Gynecology

## 2013-06-03 VITALS — BP 118/60 | Wt 216.0 lb

## 2013-06-03 DIAGNOSIS — O09219 Supervision of pregnancy with history of pre-term labor, unspecified trimester: Secondary | ICD-10-CM

## 2013-06-03 DIAGNOSIS — Z3403 Encounter for supervision of normal first pregnancy, third trimester: Secondary | ICD-10-CM

## 2013-06-03 DIAGNOSIS — O480 Post-term pregnancy: Secondary | ICD-10-CM

## 2013-06-03 NOTE — MAU Provider Note (Signed)
Attestation of Attending Supervision of Advanced Practitioner (CNM/NP): Evaluation and management procedures were performed by the Advanced Practitioner under my supervision and collaboration. I have reviewed the Advanced Practitioner's note and chart, and I agree with the management and plan.  LEGGETT,KELLY H. 3:28 PM

## 2013-06-03 NOTE — Patient Instructions (Signed)
Labor Induction  Most women go into labor on their own between 37 and 42 weeks of the pregnancy. When this does not happen or when there is a medical need, medicine or other methods may be used to induce labor. Labor induction causes a pregnant woman's uterus to contract. It also causes the cervix to soften (ripen), open (dilate), and thin out (efface). Usually, labor is not induced before 39 weeks of the pregnancy unless there is a problem with the baby or mother. Whether your labor will be induced depends on a number of factors, including the following:  The medical condition of you and the baby.  How many weeks along you are.  The status of baby's lung maturity.  The condition of the cervix.  The position of the baby. REASONS FOR LABOR INDUCTION  The health of the baby or mother is at risk.  The pregnancy is overdue by 1 week or more.  The water breaks but labor does not start on its own.  The mother has a health condition or serious illness such as high blood pressure, infection, placental abruption, or diabetes.  The amniotic fluid amounts are low around the baby.  The baby is distressed. REASONS TO NOT INDUCE LABOR Labor induction may not be a good idea if:  It is shown that your baby does not tolerate labor.  An induction is just more convenient.  You want the baby to be born on a certain date, like a holiday.  You have had previous surgeries on your uterus, such as a myomectomy or the removal of fibroids.  Your placenta lies very low in the uterus and blocks the opening of the cervix (placenta previa).  Your baby is not in a head down position.  The umbilical cord drops down into the birth canal in front of the baby. This could cut off the baby's blood and oxygen supply.  You have had a previous cesarean delivery.  There areunusual circumstances, such as the baby being extremely premature. RISKS AND COMPLICATIONS Problems may occur in the process of induction  and plans may need to be modified as a situation unfolds. Some of the risks of induction include:  Change in fetal heart rate, such as too high, too low, or erratic.  Risk of fetal distress.  Risk of infection to mother and baby.  Increased chance of having a cesarean delivery.  The rare, but increased chance that the placenta will separate from the uterus (abruption).  Uterine rupture (very rare). When induction is needed for medical reasons, the benefits of induction may outweigh the risks. BEFORE THE PROCEDURE Your caregiver will check your cervix and the baby's position. This will help your caregiver decide if you are far enough along for an induction to work. PROCEDURE Several methods of labor induction may be used, such as:   Taking prostaglandin medicine to dilate and ripen the cervix. The medicine will also start contractions. It can be taken by mouth or by inserting a suppository into the vagina.  A thin tube (catheter) with a balloon on the end may be inserted into your vagina to dilate the cervix. Once inserted, the balloon expands with water, which causes the cervix to open.  Striping the membranes. Your caregiver inserts a finger between the cervix and membranes, which causes the cervix to be stretched and may cause the uterus to contract. This is often done during an office visit. You will be sent home to wait for the contractions to begin. You will   then come in for an induction.  Breaking the water. Your caregiver will make a hole in the amniotic sac using a small instrument. Once the amniotic sac breaks, contractions should begin. This may still take hours to see an effect.  Taking medicine to trigger or strengthen contractions. This medicine is given intravenously through a tube in your arm. All of the methods of induction, besides stripping the membranes, will be done in the hospital. Induction is done in the hospital so that you and the baby can be carefully  monitored. AFTER THE PROCEDURE Some inductions can take up to 2 or 3 days. Depending on the cervix, it usually takes less time. It takes longer when you are induced early in the pregnancy or if this is your first pregnancy. If a mother is still pregnant and the induction has been going on for 2 to 3 days, either the mother will be sent home or a cesarean delivery will be needed. Document Released: 03/22/2007 Document Revised: 01/23/2012 Document Reviewed: 09/05/2011 ExitCare Patient Information 2014 [employer].  

## 2013-06-03 NOTE — Progress Notes (Signed)
WENT TO [facility] Thursday, HAVING BRAXTON HICKS CONTRACTIONS.

## 2013-06-03 NOTE — Progress Notes (Signed)
BP weight and urine results all reviewed and noted. Patient reports good fetal movement, denies any bleeding and no rupture of membranes symptoms or regular contractions. Patient is without complaints. All questions were answered. Reactive NST scheduled for induction on Friday

## 2013-06-07 ENCOUNTER — Inpatient Hospital Stay (HOSPITAL_COMMUNITY)
Admission: RE | Admit: 2013-06-07 | Discharge: 2013-06-11 | DRG: 766 | Disposition: A | Discharge status: Home / Self Care | Payer: Medicaid Other | Source: Ambulatory Visit | Attending: Obstetrics and Gynecology | Admitting: Obstetrics and Gynecology

## 2013-06-07 ENCOUNTER — Encounter (HOSPITAL_COMMUNITY): Payer: Self-pay | Admitting: Obstetrics

## 2013-06-07 DIAGNOSIS — O480 Post-term pregnancy: Principal | ICD-10-CM | POA: Diagnosis present

## 2013-06-07 DIAGNOSIS — O1213 Gestational proteinuria, third trimester: Secondary | ICD-10-CM

## 2013-06-07 LAB — CBC
HCT: 35.9 % — ABNORMAL LOW (ref 36.0–46.0)
Hemoglobin: 12.1 g/dL (ref 12.0–15.0)
MCH: 29.7 pg (ref 26.0–34.0)
MCHC: 33.7 g/dL (ref 30.0–36.0)
MCV: 88 fL (ref 78.0–100.0)
RDW: 14.4 % (ref 11.5–15.5)

## 2013-06-07 MED ORDER — CITRIC ACID-SODIUM CITRATE 334-500 MG/5ML PO SOLN
30.0000 mL | ORAL | Status: DC | PRN
  Administered 2013-06-08: 30 mL via ORAL
  Filled 2013-06-07: qty 15

## 2013-06-07 MED ORDER — MISOPROSTOL 25 MCG QUARTER TABLET
25.0000 ug | ORAL_TABLET | ORAL | Status: DC | PRN
  Administered 2013-06-07: 25 ug via VAGINAL
  Filled 2013-06-07: qty 0.25

## 2013-06-07 MED ORDER — ACETAMINOPHEN 325 MG PO TABS
650.0000 mg | ORAL_TABLET | ORAL | Status: DC | PRN
  Administered 2013-06-08: 650 mg via ORAL
  Filled 2013-06-07: qty 2

## 2013-06-07 MED ORDER — ZOLPIDEM TARTRATE 5 MG PO TABS: 5.0000 mg | ORAL_TABLET | Freq: Every evening | ORAL | Status: DC | PRN

## 2013-06-07 MED ORDER — OXYTOCIN BOLUS FROM INFUSION: 500.0000 mL | INTRAVENOUS | Status: DC

## 2013-06-07 MED ORDER — OXYCODONE-ACETAMINOPHEN 5-325 MG PO TABS: 1.0000 | ORAL_TABLET | ORAL | Status: DC | PRN

## 2013-06-07 MED ORDER — TERBUTALINE SULFATE 1 MG/ML IJ SOLN: 0.2500 mg | Freq: Once | INTRAMUSCULAR | Status: AC | PRN

## 2013-06-07 MED ORDER — LACTATED RINGERS IV SOLN
INTRAVENOUS | Status: DC
  Administered 2013-06-07 – 2013-06-08 (×3): via INTRAVENOUS

## 2013-06-07 MED ORDER — OXYTOCIN 40 UNITS IN LACTATED RINGERS INFUSION - SIMPLE MED: 62.5000 mL/h | INTRAVENOUS | Status: DC

## 2013-06-07 MED ORDER — FAMOTIDINE 20 MG PO TABS: 20.0000 mg | ORAL_TABLET | Freq: Two times a day (BID) | ORAL | Status: DC | PRN

## 2013-06-07 MED ORDER — ONDANSETRON HCL 4 MG/2ML IJ SOLN
4.0000 mg | Freq: Four times a day (QID) | INTRAMUSCULAR | Status: DC | PRN
  Administered 2013-06-08: 4 mg via INTRAVENOUS
  Filled 2013-06-07: qty 2

## 2013-06-07 MED ORDER — LIDOCAINE HCL (PF) 1 % IJ SOLN: 30.0000 mL | INTRAMUSCULAR | Status: DC | PRN

## 2013-06-07 MED ORDER — LACTATED RINGERS IV SOLN
500.0000 mL | INTRAVENOUS | Status: DC | PRN
  Administered 2013-06-08 (×2): 500 mL via INTRAVENOUS

## 2013-06-07 MED ORDER — IBUPROFEN 600 MG PO TABS: 600.0000 mg | ORAL_TABLET | Freq: Four times a day (QID) | ORAL | Status: DC | PRN

## 2013-06-07 NOTE — H&P (Signed)
Mary Moses is a 27 y.o. female presenting for induction of labor for post-dates pregnancy. She is a G1P0 at [redacted]w[redacted]d.  She denies contractions, bleeding or loss of fluid. Baby is moving normally.    She receives prenatal care at [facility] and has had no complications of this pregnancy except for some proteinuria. However, CBC, CMP and urine protein:creatinine ratio on 05/28/13 were normal. Her early 1 hour GTT was 96 and 2-hour GTT at 28 weeks was normal.   History OB History   Grav Para Term Preterm Abortions TAB SAB Ect Mult Living   1              Past Medical History  Diagnosis Date  . Pregnancy   . Obesity   . GERD (gastroesophageal reflux disease)   . Abnormal Pap smear    Past Surgical History  Procedure Laterality Date  . No past surgeries     Family History: family history includes Cancer in her father; Heart disease in her mother; Hypertension in her other; and Kidney disease in her mother. Social History:  reports that she has never smoked. She does not have any smokeless tobacco history on file. She reports that she does not drink alcohol or use illicit drugs.   Prenatal Transfer Tool  Maternal Diabetes: No Genetic Screening: Declined Too late Maternal Ultrasounds/Referrals: Normal Fetal Ultrasounds or other Referrals:  None Maternal Substance Abuse:  No Significant Maternal Medications:  None Significant Maternal Lab Results:  Lab values include: Group B Strep negative Other Comments:  None  ROS  See HPI  Dilation: 1 Effacement (%): 70 Station: -3 Exam by:: Dr Thad Ranger Blood pressure 115/91, pulse 109, temperature 98.8 F (37.1 C), temperature source Oral, resp. rate 18, height 5\' 6"  (1.676 m), weight 97.977 kg (216 lb), last menstrual period 08/24/2012. Vertex confirmed by bedside ultrasounsd  FHTs:  140, moderate variability, accels present, no decels TOCO:  Ctx q 6-8 minutes  Exam Physical Exam   GEN:  WNWD, no distress HEENT:  NCAT, EOMI,  conjunctiva clear NECK:  Supple, non-tender, no thyromegaly, trachea midline CV: RRR, no murmur RESP:  CTAB ABD:  Soft, non-tender, no guarding or rebound, normal bowel sounds EXTREM:  Warm, well perfused, no edema or tenderness NEURO:  Alert, oriented, no focal deficits  Prenatal labs: ABO, Rh: O/Positive/-- (01/10 0000) Antibody: NEG (05/05 1252) Rubella: Immune (01/10 0000) RPR: NON REAC (05/05 1252)  HBsAg: Negative (01/10 0000)  HIV: NON REACTIVE (05/05 1252)  GBS: Negative (06/28 0000)   Assessment/Plan: 28 y.o. G1P0 at [redacted]w[redacted]d here for IOL for post-dates - FHTs reactive - Cervix not favorable - Cytotec, likely foley bulb and then pitocin when cervix is more favorable - Anticipate SVD    Napoleon Form 06/07/2013, 8:57 PM

## 2013-06-08 ENCOUNTER — Encounter (HOSPITAL_COMMUNITY): Payer: Self-pay | Admitting: Anesthesiology

## 2013-06-08 ENCOUNTER — Inpatient Hospital Stay (HOSPITAL_COMMUNITY): Payer: Medicaid Other | Admitting: Anesthesiology

## 2013-06-08 ENCOUNTER — Encounter (HOSPITAL_COMMUNITY)
Admission: RE | Disposition: A | Discharge status: Home / Self Care | Payer: Self-pay | Source: Ambulatory Visit | Attending: Obstetrics and Gynecology

## 2013-06-08 DIAGNOSIS — O480 Post-term pregnancy: Secondary | ICD-10-CM

## 2013-06-08 SURGERY — Surgical Case
Anesthesia: Epidural | Site: Abdomen | Wound class: Clean Contaminated

## 2013-06-08 MED ORDER — MEPERIDINE HCL 25 MG/ML IJ SOLN
INTRAMUSCULAR | Status: AC
  Filled 2013-06-08: qty 1

## 2013-06-08 MED ORDER — CEFAZOLIN SODIUM-DEXTROSE 2-3 GM-% IV SOLR
INTRAVENOUS | Status: DC | PRN
  Administered 2013-06-08: 2 g via INTRAVENOUS

## 2013-06-08 MED ORDER — LACTATED RINGERS IV SOLN
INTRAVENOUS | Status: DC | PRN
  Administered 2013-06-08: 21:00:00 via INTRAVENOUS

## 2013-06-08 MED ORDER — DIPHENHYDRAMINE HCL 50 MG/ML IJ SOLN: 12.5000 mg | INTRAMUSCULAR | Status: DC | PRN

## 2013-06-08 MED ORDER — FENTANYL CITRATE 0.05 MG/ML IJ SOLN
25.0000 ug | INTRAMUSCULAR | Status: DC | PRN
  Administered 2013-06-08 (×4): 50 ug via INTRAVENOUS

## 2013-06-08 MED ORDER — PROMETHAZINE HCL 25 MG/ML IJ SOLN: 6.2500 mg | INTRAMUSCULAR | Status: DC | PRN

## 2013-06-08 MED ORDER — OXYTOCIN 40 UNITS IN LACTATED RINGERS INFUSION - SIMPLE MED
1.0000 m[IU]/min | INTRAVENOUS | Status: DC
  Administered 2013-06-08: 2 m[IU]/min via INTRAVENOUS
  Filled 2013-06-08: qty 1000

## 2013-06-08 MED ORDER — ONDANSETRON HCL 4 MG/2ML IJ SOLN
INTRAMUSCULAR | Status: AC
  Filled 2013-06-08: qty 2

## 2013-06-08 MED ORDER — KETOROLAC TROMETHAMINE 30 MG/ML IJ SOLN: 30.0000 mg | Freq: Four times a day (QID) | INTRAMUSCULAR | Status: AC | PRN

## 2013-06-08 MED ORDER — SCOPOLAMINE 1 MG/3DAYS TD PT72
MEDICATED_PATCH | TRANSDERMAL | Status: AC
  Filled 2013-06-08: qty 1

## 2013-06-08 MED ORDER — PHENYLEPHRINE HCL 10 MG/ML IJ SOLN
INTRAMUSCULAR | Status: DC | PRN
  Administered 2013-06-08: 40 ug via INTRAVENOUS
  Administered 2013-06-08 (×2): 80 ug via INTRAVENOUS

## 2013-06-08 MED ORDER — EPHEDRINE 5 MG/ML INJ: 10.0000 mg | INTRAVENOUS | Status: DC | PRN

## 2013-06-08 MED ORDER — MORPHINE SULFATE (PF) 0.5 MG/ML IJ SOLN
INTRAMUSCULAR | Status: DC | PRN
  Administered 2013-06-08: 4 mg via EPIDURAL

## 2013-06-08 MED ORDER — EPHEDRINE 5 MG/ML INJ
10.0000 mg | INTRAVENOUS | Status: DC | PRN
  Filled 2013-06-08: qty 4

## 2013-06-08 MED ORDER — SODIUM BICARBONATE 8.4 % IV SOLN
INTRAVENOUS | Status: DC | PRN
  Administered 2013-06-08: 5 mL via EPIDURAL

## 2013-06-08 MED ORDER — FENTANYL CITRATE 0.05 MG/ML IJ SOLN
INTRAMUSCULAR | Status: AC
  Filled 2013-06-08: qty 2

## 2013-06-08 MED ORDER — MORPHINE SULFATE 0.5 MG/ML IJ SOLN
INTRAMUSCULAR | Status: AC
  Filled 2013-06-08: qty 10

## 2013-06-08 MED ORDER — PHENYLEPHRINE 40 MCG/ML (10ML) SYRINGE FOR IV PUSH (FOR BLOOD PRESSURE SUPPORT)
80.0000 ug | PREFILLED_SYRINGE | INTRAVENOUS | Status: DC | PRN
  Filled 2013-06-08: qty 5

## 2013-06-08 MED ORDER — LIDOCAINE-EPINEPHRINE (PF) 2 %-1:200000 IJ SOLN
INTRAMUSCULAR | Status: AC
  Filled 2013-06-08: qty 20

## 2013-06-08 MED ORDER — MEPERIDINE HCL 25 MG/ML IJ SOLN
6.2500 mg | INTRAMUSCULAR | Status: DC | PRN
  Administered 2013-06-08: 6.25 mg via INTRAVENOUS

## 2013-06-08 MED ORDER — MEPERIDINE HCL 25 MG/ML IJ SOLN: 6.2500 mg | INTRAMUSCULAR | Status: DC | PRN

## 2013-06-08 MED ORDER — MIDAZOLAM HCL 2 MG/2ML IJ SOLN: 0.5000 mg | Freq: Once | INTRAMUSCULAR | Status: DC | PRN

## 2013-06-08 MED ORDER — PHENYLEPHRINE 40 MCG/ML (10ML) SYRINGE FOR IV PUSH (FOR BLOOD PRESSURE SUPPORT)
PREFILLED_SYRINGE | INTRAVENOUS | Status: AC
  Filled 2013-06-08: qty 5

## 2013-06-08 MED ORDER — SCOPOLAMINE 1 MG/3DAYS TD PT72
1.0000 | MEDICATED_PATCH | Freq: Once | TRANSDERMAL | Status: DC
  Administered 2013-06-08: 1.5 mg via TRANSDERMAL

## 2013-06-08 MED ORDER — KETOROLAC TROMETHAMINE 30 MG/ML IJ SOLN
INTRAMUSCULAR | Status: AC
  Filled 2013-06-08: qty 1

## 2013-06-08 MED ORDER — OXYTOCIN 10 UNIT/ML IJ SOLN
INTRAMUSCULAR | Status: AC
  Filled 2013-06-08: qty 4

## 2013-06-08 MED ORDER — LACTATED RINGERS IV SOLN: 500.0000 mL | Freq: Once | INTRAVENOUS | Status: DC

## 2013-06-08 MED ORDER — 0.9 % SODIUM CHLORIDE (POUR BTL) OPTIME
TOPICAL | Status: DC | PRN
  Administered 2013-06-08: 200 mL

## 2013-06-08 MED ORDER — ONDANSETRON HCL 4 MG/2ML IJ SOLN
INTRAMUSCULAR | Status: DC | PRN
  Administered 2013-06-08: 4 mg via INTRAVENOUS

## 2013-06-08 MED ORDER — BUPIVACAINE HCL (PF) 0.5 % IJ SOLN
INTRAMUSCULAR | Status: DC | PRN
  Administered 2013-06-08: 30 mL

## 2013-06-08 MED ORDER — OXYTOCIN 10 UNIT/ML IJ SOLN
40.0000 [IU] | INTRAVENOUS | Status: DC | PRN
  Administered 2013-06-08: 40 [IU] via INTRAVENOUS

## 2013-06-08 MED ORDER — MORPHINE SULFATE (PF) 0.5 MG/ML IJ SOLN
INTRAMUSCULAR | Status: DC | PRN
  Administered 2013-06-08: 1 mg via INTRAVENOUS

## 2013-06-08 MED ORDER — PHENYLEPHRINE 40 MCG/ML (10ML) SYRINGE FOR IV PUSH (FOR BLOOD PRESSURE SUPPORT): 80.0000 ug | PREFILLED_SYRINGE | INTRAVENOUS | Status: DC | PRN

## 2013-06-08 MED ORDER — SODIUM BICARBONATE 8.4 % IV SOLN
INTRAVENOUS | Status: AC
  Filled 2013-06-08: qty 50

## 2013-06-08 MED ORDER — KETOROLAC TROMETHAMINE 30 MG/ML IJ SOLN
30.0000 mg | Freq: Four times a day (QID) | INTRAMUSCULAR | Status: AC | PRN
  Administered 2013-06-08: 30 mg via INTRAVENOUS

## 2013-06-08 MED ORDER — FENTANYL CITRATE 0.05 MG/ML IJ SOLN
50.0000 ug | INTRAMUSCULAR | Status: DC | PRN
  Administered 2013-06-08 (×2): 50 ug via INTRAVENOUS
  Filled 2013-06-08 (×2): qty 2

## 2013-06-08 MED ORDER — TERBUTALINE SULFATE 1 MG/ML IJ SOLN: 0.2500 mg | Freq: Once | INTRAMUSCULAR | Status: DC | PRN

## 2013-06-08 MED ORDER — CEFAZOLIN SODIUM-DEXTROSE 2-3 GM-% IV SOLR
2.0000 g | INTRAVENOUS | Status: DC
  Filled 2013-06-08: qty 50

## 2013-06-08 MED ORDER — FENTANYL 2.5 MCG/ML BUPIVACAINE 1/10 % EPIDURAL INFUSION (WH - ANES)
14.0000 mL/h | INTRAMUSCULAR | Status: DC | PRN
  Administered 2013-06-08 (×2): 14 mL/h via EPIDURAL
  Filled 2013-06-08 (×2): qty 125

## 2013-06-08 MED ORDER — BUPIVACAINE HCL (PF) 0.5 % IJ SOLN
INTRAMUSCULAR | Status: AC
  Filled 2013-06-08: qty 30

## 2013-06-08 MED ORDER — MEPERIDINE HCL 25 MG/ML IJ SOLN
INTRAMUSCULAR | Status: DC | PRN
  Administered 2013-06-08 (×2): 12.5 mg via INTRAVENOUS

## 2013-06-08 SURGICAL SUPPLY — 31 items
CLAMP CORD UMBIL (MISCELLANEOUS) IMPLANT
CLOTH BEACON ORANGE TIMEOUT ST (SAFETY) ×2 IMPLANT
DRAPE LG THREE QUARTER DISP (DRAPES) ×2 IMPLANT
DRSG OPSITE POSTOP 4X10 (GAUZE/BANDAGES/DRESSINGS) ×2 IMPLANT
DURAPREP 26ML APPLICATOR (WOUND CARE) ×2 IMPLANT
ELECT REM PT RETURN 9FT ADLT (ELECTROSURGICAL) ×2
ELECTRODE REM PT RTRN 9FT ADLT (ELECTROSURGICAL) ×1 IMPLANT
EXTRACTOR VACUUM M CUP 4 TUBE (SUCTIONS) IMPLANT
GLOVE BIO SURGEON STRL SZ7 (GLOVE) ×2 IMPLANT
GLOVE BIOGEL PI IND STRL 7.0 (GLOVE) ×2 IMPLANT
GLOVE BIOGEL PI INDICATOR 7.0 (GLOVE) ×2
GOWN STRL REIN XL XLG (GOWN DISPOSABLE) ×4 IMPLANT
KIT ABG SYR 3ML LUER SLIP (SYRINGE) ×2 IMPLANT
NEEDLE HYPO 22GX1.5 SAFETY (NEEDLE) ×2 IMPLANT
NEEDLE HYPO 25X5/8 SAFETYGLIDE (NEEDLE) ×2 IMPLANT
NS IRRIG 1000ML POUR BTL (IV SOLUTION) ×2 IMPLANT
PACK C SECTION WH (CUSTOM PROCEDURE TRAY) ×2 IMPLANT
PAD ABD 7.5X8 STRL (GAUZE/BANDAGES/DRESSINGS) ×2 IMPLANT
PAD OB MATERNITY 4.3X12.25 (PERSONAL CARE ITEMS) ×2 IMPLANT
RTRCTR C-SECT PINK 25CM LRG (MISCELLANEOUS) ×2 IMPLANT
STAPLER VISISTAT 35W (STAPLE) IMPLANT
SUT PDS AB 0 CT1 27 (SUTURE) IMPLANT
SUT PDS AB 0 CTX 36 PDP370T (SUTURE) IMPLANT
SUT VIC AB 0 CT1 36 (SUTURE) ×4 IMPLANT
SUT VIC AB 0 CTX 36 (SUTURE) ×3
SUT VIC AB 0 CTX36XBRD ANBCTRL (SUTURE) ×3 IMPLANT
SUT VIC AB 4-0 KS 27 (SUTURE) ×2 IMPLANT
SYR 30ML LL (SYRINGE) ×2 IMPLANT
TOWEL OR 17X24 6PK STRL BLUE (TOWEL DISPOSABLE) ×6 IMPLANT
TRAY FOLEY CATH 14FR (SET/KITS/TRAYS/PACK) IMPLANT
WATER STERILE IRR 1000ML POUR (IV SOLUTION) ×2 IMPLANT

## 2013-06-08 NOTE — Progress Notes (Signed)
LABOR PROGRESS NOTE  Mary Moses is a 27 y.o. G1P0 at [redacted]w[redacted]d  admitted for postdates IOL  Subjective: PT doing well. Complaining of tiredness.   Objective: BP 146/68  Pulse 106  Temp(Src) 99.6 F (37.6 C) (Oral)  Resp 20  Ht 5\' 6"  (1.676 m)  Wt 97.977 kg (216 lb)  BMI 34.88 kg/m2  SpO2 100%  LMP 08/24/2012 Total I/O In: -  Out: 100 [Urine:100]  FHT:  FHR: 150 bpm, variability: moderate,  accelerations:  Present,  decelerations:  Absent UC:   Spaced irregularly and long when occur 3-28min long SVE:   Dilation: 7 Effacement (%): 90 Station: -2 Exam by:: dr Reola Calkins Pitocin @ 3 mu/min  Labs: Lab Results  Component Value Date   WBC 12.9* 06/07/2013   HGB 12.1 06/07/2013   HCT 35.9* 06/07/2013   MCV 88.0 06/07/2013   PLT 128* 06/07/2013    Assessment / Plan: Induction of labor due to postdates,  progressing well on pitocin  Labor: slow progression.  Placed IUPC without difficulty. Initial contractions inadequate. will increase pit as needed Fetal Wellbeing:  Category I Pain Control:  Epidural Anticipated MOD:  NSVD  Mary Moses L, MD 06/08/2013, 5:36 PM

## 2013-06-08 NOTE — Progress Notes (Signed)
Patient ID: Mary Moses, female   DOB: Mar 05, 1986, 26 y.o.   MRN: [id number]  S: Patient sleeping  O:  Filed Vitals:   06/08/13 0228  BP: 131/81  Pulse: 94  Temp: 99.1 F (37.3 C)  Resp: 18    FHT: 150bpm, moderate variability, accels present, no decels Toco: not tracing well, irregular  A/P: Vitals stable Re-check in morning, letting patient sleep Cytotec x1

## 2013-06-08 NOTE — Progress Notes (Signed)
Mary Moses is a 27 y.o. G1P0 at [redacted]w[redacted]d admitted for induction of labor due to Post dates. Due date 7/18.  Subjective: Comfortable w/ epidural, no complaints  Objective: BP 113/61  Pulse 113  Temp(Src) 98.8 F (37.1 C) (Oral)  Resp 18  Ht 5\' 6"  (1.676 m)  Wt 97.977 kg (216 lb)  BMI 34.88 kg/m2  SpO2 100%  LMP 08/24/2012   Total I/O In: -  Out: 100 [Urine:100]  FHT:  FHR: 145 bpm, variability: moderate,  accelerations:  Present,  decelerations:  Absent UC:   regular, every 2-4 minutes SVE:   Dilation: 7 Effacement (%): 90 Station: -1 Exam by:: Mary Slim RN   Labs: Lab Results  Component Value Date   WBC 12.9* 06/07/2013   HGB 12.1 06/07/2013   HCT 35.9* 06/07/2013   MCV 88.0 06/07/2013   PLT 128* 06/07/2013    Assessment / Plan: IOL d/t postdates, s/p foley bulb, uc's have spaced out somewhat and no cervical change, will begin pitocin augmentation per protocol  Labor: progressing slowly Preeclampsia:  n/a Fetal Wellbeing:  Category I Pain Control:  Epidural I/D:  n/a Anticipated MOD:  NSVD  Mary Moses 06/08/2013, 3:06 PM

## 2013-06-08 NOTE — Progress Notes (Signed)
Patient ID: Mary Moses, female   DOB: 11-16-1985, 27 y.o.   MRN: [id number] LABOR PROGRESS NOTE  Mary Moses is a 27 y.o. G1P0 at [redacted]w[redacted]d  admitted for induction of labor due to Post dates. Due date 05/31/13.  Subjective:  S/p epidural. Doing well. No complaints  Objective: BP 128/63  Pulse 104  Temp(Src) 98.7 F (37.1 C) (Oral)  Resp 18  Ht 5\' 6"  (1.676 m)  Wt 97.977 kg (216 lb)  BMI 34.88 kg/m2  SpO2 100%  LMP 08/24/2012    FHT:  FHR: 150 bpm, variability: moderate,  accelerations:  Present,  decelerations:  Absent UC:   regular, every 2-3 minutes Initial SVE: 2/70/-2, foley bulb placed and while being taped, fell out with repeat exam as below:   SVE:   Dilation: 5 Effacement (%): 80 Station: -2 Exam by:: kim booker cnm   Labs: Lab Results  Component Value Date   WBC 12.9* 06/07/2013   HGB 12.1 06/07/2013   HCT 35.9* 06/07/2013   MCV 88.0 06/07/2013   PLT 128* 06/07/2013    Assessment / Plan: Induction of labor due to postterm,  progressing well on pitocin  Labor: progressing s/p foley bulb.   Fetal Wellbeing:  Category I Pain Control:  Epidural Anticipated MOD:  NSVD  Mary Raynes L, MD 06/08/2013, 10:02 AM

## 2013-06-08 NOTE — Anesthesia Preprocedure Evaluation (Signed)
Anesthesia Evaluation  Patient identified by MRN, date of birth, ID band Patient awake    Reviewed: Allergy & Precautions, H&P , Patient's Chart, lab work & pertinent test results  Airway Mallampati: II  TM Distance: >3 FB Neck ROM: full    Dental  (+) Teeth Intact   Pulmonary  breath sounds clear to auscultation        Cardiovascular Rhythm:regular Rate:Normal     Neuro/Psych    GI/Hepatic GERD-  ,  Endo/Other    Renal/GU      Musculoskeletal   Abdominal   Peds  Hematology   Anesthesia Other Findings       Reproductive/Obstetrics (+) Pregnancy                             Anesthesia Physical Anesthesia Plan  ASA: II  Anesthesia Plan: Epidural   Post-op Pain Management:    Induction:   Airway Management Planned:   Additional Equipment:   Intra-op Plan:   Post-operative Plan:   Informed Consent: I have reviewed the patients History and Physical, chart, labs and discussed the procedure including the risks, benefits and alternatives for the proposed anesthesia with the patient or authorized representative who has indicated his/her understanding and acceptance.   Dental Advisory Given  Plan Discussed with:   Anesthesia Plan Comments: (Labs checked- platelets confirmed with RN in room. Fetal heart tracing, per RN, reported to be stable enough for sitting procedure. Discussed epidural, and patient consents to the procedure:  included risk of possible headache,backache, failed block, allergic reaction, and nerve injury. This patient was asked if she had any questions or concerns before the procedure started.)        Anesthesia Quick Evaluation  

## 2013-06-08 NOTE — Progress Notes (Signed)
[facility]ulty [facility] OB/GYN Attending Note  Subjective:  Patient with fetal tachycardia to 170s and minimal variability.  No accelerations or decelerations.  No cervical change for several hours.  Also has a temp of 100.2.  Admitted on 06/07/2013 for IOL for postdates.   Objective:  Blood pressure 144/84, pulse 108, temperature 100.2 F (37.9 C), temperature source Oral, resp. rate 18, height 5\' 6"  (1.676 m), weight 216 lb (97.977 kg), last menstrual period 08/24/2012, SpO2 100.00%. FHT  As above Toco: q1-2 mins Cervix: 7/90/-1/caput and molding present/edematous cervix noted.  No scalp stimulation response on the FHR tracing. Ext: 2+ DTRs, no edema, no cyanosis, negative Homan's sign  Assessment & Plan:  27 y.o. G1P0 at [redacted]w[redacted]d admitted for IOL for postdates now with non-reassuring fetal heart rate tracing and failure of cervical dilation.  Recommended cesarean delivery.  The risks of cesarean section discussed with the patient included but were not limited to: bleeding which may require transfusion or reoperation; infection which may require antibiotics; injury to bowel, bladder, ureters or other surrounding organs; injury to the fetus; need for additional procedures including hysterectomy in the event of a life-threatening hemorrhage; placental abnormalities wth subsequent pregnancies, incisional problems, thromboembolic phenomenon and other postoperative/anesthesia complications. The patient concurred with the proposed plan, giving informed written consent for the procedure.   Anesthesia and OR aware. Preoperative prophylactic antibiotics and SCDs ordered on call to the OR.  To OR when ready.   Jaynie Collins, MD, FACOG Attending Obstetrician & Gynecologist Faculty Practice, [facility] [place]

## 2013-06-08 NOTE — Progress Notes (Signed)
Dr. Macon Large at the bedside to discuss risks and benefits of cesarean section delivery. Pt states understanding. Informed consents signed.

## 2013-06-08 NOTE — OR Nursing (Signed)
100 ml blood loss during fundal massage by DLWegner RN

## 2013-06-08 NOTE — Op Note (Signed)
Mary Moses PROCEDURE DATE:  06/08/2013  PREOPERATIVE DIAGNOSES: Intrauterine pregnancy at  [redacted]w[redacted]d weeks gestation; failure to progress: arrest of dilation, fetal tachycardia, repetitive decels.   POSTOPERATIVE DIAGNOSES: The same  PROCEDURE: Primary Low Transverse Cesarean Section  SURGEON:  Dr. Macon Large  ASSISTANT:  Dr. Reola Calkins  ANESTHESIOLOGIST: Dr. Sheral Apley  INDICATIONS: Mary Moses is a 27 y.o. G1P0 at [redacted]w[redacted]d here for cesarean section secondary to the indications listed under preoperative diagnoses; please see preoperative note for further details.  The risks of cesarean section were discussed with the patient including but were not limited to: bleeding which may require transfusion or reoperation; infection which may require antibiotics; injury to bowel, bladder, ureters or other surrounding organs; injury to the fetus; need for additional procedures including hysterectomy in the event of a life-threatening hemorrhage; placental abnormalities wth subsequent pregnancies, incisional problems, thromboembolic phenomenon and other postoperative/anesthesia complications.   The patient concurred with the proposed plan, giving informed written consent for the procedure.    FINDINGS:  Viable female infant in cephalic presentation.  Apgars 8 and 9. Arterial cord pH 7.23.  Clear amniotic fluid.  Intact placenta, three vessel cord.  Normal uterus, fallopian tubes and ovaries bilaterally.  ANESTHESIA: Epidural INTRAVENOUS FLUIDS: 1800 ml ESTIMATED BLOOD LOSS: 1000 ml URINE OUTPUT:  100 ml SPECIMENS: Placenta sent to pathology COMPLICATIONS: None immediate  PROCEDURE IN DETAIL:  The patient preoperatively received intravenous antibiotics and had sequential compression devices applied to her lower extremities.  She was then taken to the operating room where the epidural anesthesia was dosed up to surgical level and was found to be adequate. She was then placed in a dorsal supine position with  a leftward tilt, and prepped and draped in a sterile manner.  A foley catheter was placed into her bladder and attached to constant gravity.  After an adequate timeout was performed, a Pfannenstiel skin incision was made with scalpel and carried through to the underlying layer of fascia. The fascia was incised in the midline, and this incision was extended bilaterally using the Mayo scissors.  Kocher clamps were applied to the superior aspect of the fascial incision and the underlying rectus muscles were dissected off bluntly. A similar process was carried out on the inferior aspect of the fascial incision. The rectus muscles were separated in the midline bluntly and the peritoneum was entered bluntly. Attention was turned to the lower uterine segment where a low transverse hysterotomy was made with a scalpel and extended bilaterally bluntly.  The infant was successfully delivered, the cord was clamped and cut and the infant was handed over to awaiting neonatology team. Uterine massage was then administered, and the placenta delivered intact with a three-vessel cord. The uterus was then cleared of clot and debris.  The hysterotomy was closed with 0 Vicryl in a running locked fashion, and an imbricating layer was also placed with 0 Vicryl. The pelvis was cleared of all clot and debris. Hemostasis was confirmed on all surfaces.  The peritoneum and the muscles were reapproximated using 0 Vicryl interrupted stitches. Diffuse oozing was noted of the rectus muscles and subcutaneous tissue. The fascia was then closed using 0 Vicryl in a running fashion. 30 ml of 0.5% Marcaine was injected subcutaneously around the incision.  The skin was closed with a 4-0 Vicryl subcuticular stitch. The patient tolerated the procedure well. A pressure dressing was applied.  Sponge, lap, instrument and needle counts were correct x 2.  She was taken to the recovery room in stable  condition.   Vale Haven MD

## 2013-06-08 NOTE — Anesthesia Postprocedure Evaluation (Signed)
  Anesthesia Post Note  Patient: Mary Moses  Procedure(s) Performed: Procedure(s) (LRB): Primary CESAREAN SECTION of baby girl  at 2056 APGAR 8/9 (N/A)  Anesthesia type: Epidural  Patient location: PACU  Post pain: Pain level controlled  Post assessment: Post-op Vital signs reviewed  Last Vitals:  Filed Vitals:   06/08/13 2315  BP: 131/66  Pulse: 124  Temp:   Resp: 17    Post vital signs: Reviewed  Level of consciousness: awake  Complications: No apparent anesthesia complications

## 2013-06-08 NOTE — Anesthesia Procedure Notes (Signed)

## 2013-06-08 NOTE — Progress Notes (Signed)
Mary Moses is a 27 y.o. G1P0 at [redacted]w[redacted]d by ultrasound admitted for induction of labor due to Post dates..  Subjective: Starting to have more pain with UCs  Objective: BP 131/81  Pulse 94  Temp(Src) 99.1 F (37.3 C) (Oral)  Resp 18  Ht 5\' 6"  (1.676 m)  Wt 97.977 kg (216 lb)  BMI 34.88 kg/m2  LMP 08/24/2012      FHT:  FHR: 150 bpm, variability: moderate,  accelerations:  Abscent,  decelerations:  Absent UC:   irregular, every 2-3 minutes SVE:   Dilation: 2 Effacement (%): 70 Station: -2;-1 Exam by:: Larose Kells RN  Labs: Lab Results  Component Value Date   WBC 12.9* 06/07/2013   HGB 12.1 06/07/2013   HCT 35.9* 06/07/2013   MCV 88.0 06/07/2013   PLT 128* 06/07/2013    Assessment / Plan: Induction of labor due to postterm,  progressing well on pitocin  Labor: Progressing well. Will let her up to shower and have a bite to eat then start Pitocin Preeclampsia:  n/a Fetal Wellbeing:  Category I Pain Control:  Labor support without medications I/D:  n/a Anticipated MOD:  NSVD  [facility] 06/08/2013, 4:41 AM

## 2013-06-08 NOTE — Transfer of Care (Signed)
Immediate Anesthesia Transfer of Care Note  Patient: Mary Moses  Procedure(s) Performed: Procedure(s): Primary CESAREAN SECTION of baby girl  at 2056 APGAR 8/9 (N/A)  Patient Location: PACU  Anesthesia Type:Epidural  Level of Consciousness: awake  Airway & Oxygen Therapy: Patient Spontanous Breathing  Post-op Assessment: Report given to PACU RN and Post -op Vital signs reviewed and stable  Post vital signs: stable  Complications: No apparent anesthesia complications

## 2013-06-08 NOTE — Progress Notes (Signed)
Kim booker cnm aware of increase in fhr baseline - no new orders @ present

## 2013-06-09 ENCOUNTER — Encounter (HOSPITAL_COMMUNITY): Payer: Self-pay | Admitting: *Deleted

## 2013-06-09 LAB — CBC
MCV: 88.1 fL (ref 78.0–100.0)
Platelets: 117 10*3/uL — ABNORMAL LOW (ref 150–400)
RBC: 3.36 MIL/uL — ABNORMAL LOW (ref 3.87–5.11)
RDW: 14.4 % (ref 11.5–15.5)
WBC: 19.7 10*3/uL — ABNORMAL HIGH (ref 4.0–10.5)

## 2013-06-09 MED ORDER — DIBUCAINE 1 % RE OINT: 1.0000 "application " | TOPICAL_OINTMENT | RECTAL | Status: DC | PRN

## 2013-06-09 MED ORDER — ACETAMINOPHEN 10 MG/ML IV SOLN
1000.0000 mg | Freq: Four times a day (QID) | INTRAVENOUS | Status: AC | PRN
  Filled 2013-06-09: qty 100

## 2013-06-09 MED ORDER — MAGNESIUM HYDROXIDE 400 MG/5ML PO SUSP: 30.0000 mL | ORAL | Status: DC | PRN

## 2013-06-09 MED ORDER — PRENATAL MULTIVITAMIN CH
1.0000 | ORAL_TABLET | Freq: Every day | ORAL | Status: DC
  Administered 2013-06-09 – 2013-06-11 (×3): 1 via ORAL
  Filled 2013-06-09 (×3): qty 1

## 2013-06-09 MED ORDER — LANOLIN HYDROUS EX OINT: 1.0000 "application " | TOPICAL_OINTMENT | CUTANEOUS | Status: DC | PRN

## 2013-06-09 MED ORDER — NALOXONE HCL 0.4 MG/ML IJ SOLN: 0.4000 mg | INTRAMUSCULAR | Status: DC | PRN

## 2013-06-09 MED ORDER — SENNOSIDES-DOCUSATE SODIUM 8.6-50 MG PO TABS
2.0000 | ORAL_TABLET | Freq: Every day | ORAL | Status: DC
  Administered 2013-06-09 – 2013-06-10 (×2): 2 via ORAL

## 2013-06-09 MED ORDER — DIPHENHYDRAMINE HCL 50 MG/ML IJ SOLN
12.5000 mg | INTRAMUSCULAR | Status: DC | PRN
Start: 2013-06-09 — End: 2013-06-11

## 2013-06-09 MED ORDER — ONDANSETRON HCL 4 MG/2ML IJ SOLN: 4.0000 mg | INTRAMUSCULAR | Status: DC | PRN

## 2013-06-09 MED ORDER — NALBUPHINE SYRINGE 5 MG/0.5 ML
5.0000 mg | INJECTION | INTRAMUSCULAR | Status: DC | PRN
  Filled 2013-06-09: qty 1

## 2013-06-09 MED ORDER — MEASLES, MUMPS & RUBELLA VAC ~~LOC~~ INJ
0.5000 mL | INJECTION | Freq: Once | SUBCUTANEOUS | Status: DC
  Filled 2013-06-09: qty 0.5

## 2013-06-09 MED ORDER — DIPHENHYDRAMINE HCL 25 MG PO CAPS: 25.0000 mg | ORAL_CAPSULE | Freq: Four times a day (QID) | ORAL | Status: DC | PRN

## 2013-06-09 MED ORDER — SODIUM CHLORIDE 0.9 % IJ SOLN: 3.0000 mL | INTRAMUSCULAR | Status: DC | PRN

## 2013-06-09 MED ORDER — MENTHOL 3 MG MT LOZG: 1.0000 | LOZENGE | OROMUCOSAL | Status: DC | PRN

## 2013-06-09 MED ORDER — OXYTOCIN 40 UNITS IN LACTATED RINGERS INFUSION - SIMPLE MED: 62.5000 mL/h | INTRAVENOUS | Status: AC

## 2013-06-09 MED ORDER — TETANUS-DIPHTH-ACELL PERTUSSIS 5-2.5-18.5 LF-MCG/0.5 IM SUSP
0.5000 mL | Freq: Once | INTRAMUSCULAR | Status: AC
  Administered 2013-06-10: 0.5 mL via INTRAMUSCULAR

## 2013-06-09 MED ORDER — ONDANSETRON HCL 4 MG PO TABS: 4.0000 mg | ORAL_TABLET | ORAL | Status: DC | PRN

## 2013-06-09 MED ORDER — DIPHENHYDRAMINE HCL 25 MG PO CAPS: 25.0000 mg | ORAL_CAPSULE | ORAL | Status: DC | PRN

## 2013-06-09 MED ORDER — OXYCODONE-ACETAMINOPHEN 5-325 MG PO TABS
1.0000 | ORAL_TABLET | ORAL | Status: DC | PRN
  Administered 2013-06-10: 1 via ORAL
  Filled 2013-06-09: qty 1

## 2013-06-09 MED ORDER — ONDANSETRON HCL 4 MG/2ML IJ SOLN: 4.0000 mg | Freq: Three times a day (TID) | INTRAMUSCULAR | Status: DC | PRN

## 2013-06-09 MED ORDER — WITCH HAZEL-GLYCERIN EX PADS: 1.0000 "application " | MEDICATED_PAD | CUTANEOUS | Status: DC | PRN

## 2013-06-09 MED ORDER — SIMETHICONE 80 MG PO CHEW: 80.0000 mg | CHEWABLE_TABLET | ORAL | Status: DC | PRN

## 2013-06-09 MED ORDER — ZOLPIDEM TARTRATE 5 MG PO TABS: 5.0000 mg | ORAL_TABLET | Freq: Every evening | ORAL | Status: DC | PRN

## 2013-06-09 MED ORDER — METOCLOPRAMIDE HCL 5 MG/ML IJ SOLN: 10.0000 mg | Freq: Three times a day (TID) | INTRAMUSCULAR | Status: DC | PRN

## 2013-06-09 MED ORDER — DEXTROSE 5 % IV SOLN
1.0000 ug/kg/h | INTRAVENOUS | Status: DC | PRN
  Filled 2013-06-09: qty 2

## 2013-06-09 MED ORDER — IBUPROFEN 600 MG PO TABS
600.0000 mg | ORAL_TABLET | Freq: Four times a day (QID) | ORAL | Status: DC
  Administered 2013-06-09 – 2013-06-11 (×10): 600 mg via ORAL
  Filled 2013-06-09 (×10): qty 1

## 2013-06-09 MED ORDER — SIMETHICONE 80 MG PO CHEW
80.0000 mg | CHEWABLE_TABLET | Freq: Three times a day (TID) | ORAL | Status: DC
  Administered 2013-06-09 – 2013-06-11 (×9): 80 mg via ORAL

## 2013-06-09 MED ORDER — LACTATED RINGERS IV SOLN
INTRAVENOUS | Status: DC
  Administered 2013-06-09: 07:00:00 via INTRAVENOUS

## 2013-06-09 MED ORDER — DIPHENHYDRAMINE HCL 50 MG/ML IJ SOLN
25.0000 mg | INTRAMUSCULAR | Status: DC | PRN
Start: 2013-06-09 — End: 2013-06-11

## 2013-06-09 NOTE — Progress Notes (Signed)
Subjective: Postpartum Day 1: Cesarean Delivery Patient reports incisional pain, tolerating PO and + flatus.    Objective: Vital signs in last 24 hours: Temp:  [98.1 F (36.7 C)-100.2 F (37.9 C)] 98.6 F (37 C) (07/27 1033) Pulse Rate:  [99-132] 109 (07/27 1033) Resp:  [16-24] 18 (07/27 1033) BP: (101-149)/(58-84) 101/67 mmHg (07/27 1033) SpO2:  [91 %-97 %] 96 % (07/27 1033)  Physical Exam:  General: alert, cooperative and no distress Lochia: appropriate Uterine Fundus: firm Incision: healing well, no significant drainage, no dehiscence, no significant erythema DVT Evaluation: No evidence of DVT seen on physical exam.   Recent Labs  06/07/13 2055 06/09/13 0641  HGB 12.1 10.0*  HCT 35.9* 29.6*    Assessment/Plan: Status post Cesarean section. Doing well postoperatively.  Continue current care.  Mary Moses 06/09/2013, 2:15 PM

## 2013-06-09 NOTE — Op Note (Signed)
Attestation of Attending Supervision of Obstetric Fellow: Evaluation and management procedures were performed by the Obstetric Fellow under my supervision and collaboration.  I have reviewed the Obstetric Fellow's note and chart, and I agree with her documentation. I was present and scrubbed for the entire case.  Jaynie Collins, MD, FACOG Attending Obstetrician & Gynecologist Faculty Practice, [facility] [place]

## 2013-06-09 NOTE — H&P (Signed)
Attestation of Attending Supervision of Advanced Practitioner: Evaluation and management procedures were performed by the PA/NP/CNM/OB Fellow under my supervision/collaboration. Chart reviewed and agree with management and plan.  Tilda Burrow 06/09/2013 9:39 PM

## 2013-06-09 NOTE — Anesthesia Postprocedure Evaluation (Signed)
  Anesthesia Post-op Note  Patient: Mary Moses  Procedure(s) Performed: Procedure(s): Primary CESAREAN SECTION of baby girl  at 2056 APGAR 8/9 (N/A)  Patient Location: Mother/Baby  Anesthesia Type:Epidural  Level of Consciousness: awake, alert  and oriented  Airway and Oxygen Therapy: Patient Spontanous Breathing  Post-op Pain: mild  Post-op Assessment: Post-op Vital signs reviewed, Patient's Cardiovascular Status Stable, No headache, No backache, No residual numbness and No residual motor weakness  Post-op Vital Signs: Reviewed and stable  Complications: No apparent anesthesia complications

## 2013-06-10 ENCOUNTER — Encounter (HOSPITAL_COMMUNITY): Payer: Self-pay | Admitting: Obstetrics & Gynecology

## 2013-06-10 LAB — ABO/RH: ABO/RH(D): O POS

## 2013-06-10 NOTE — Progress Notes (Signed)
Subjective: Postpartum Day 2: Cesarean Delivery Patient reports incisional pain, tolerating PO, + flatus and no problems voiding.  Bottle-feeding, plans Nexplanon for contraception.  Objective: Vital signs in last 24 hours: Temp:  [97.5 F (36.4 C)-99 F (37.2 C)] 97.8 F (36.6 C) (07/27 2036) Pulse Rate:  [98-109] 98 (07/27 2036) Resp:  [16-18] 16 (07/27 2036) BP: (101-125)/(66-72) 123/72 mmHg (07/27 2036) SpO2:  [96 %-97 %] 97 % (07/27 1530)  Physical Exam:  General: alert, cooperative and no distress Lochia: appropriate Uterine Fundus: firm Incision: no significant drainage. Pressure dressing still on. DVT Evaluation: No evidence of DVT seen on physical exam. Negative Homan's sign. No cords or calf tenderness. No significant calf/ankle edema.   Recent Labs  06/07/13 2055 06/09/13 0641  HGB 12.1 10.0*  HCT 35.9* 29.6*    Assessment/Plan: Status post Cesarean section. Doing well postoperatively.  Continue current care. Plan for d/c home tomorrow.  Napoleon Form 06/10/2013, 8:09 AM

## 2013-06-10 NOTE — Progress Notes (Signed)
UR chart review completed.  

## 2013-06-11 ENCOUNTER — Telehealth: Payer: Self-pay | Admitting: *Deleted

## 2013-06-11 MED ORDER — OXYCODONE-ACETAMINOPHEN 5-325 MG PO TABS: 1.0000 | ORAL_TABLET | Freq: Four times a day (QID) | ORAL | Status: DC | PRN

## 2013-06-11 MED ORDER — SENNOSIDES-DOCUSATE SODIUM 8.6-50 MG PO TABS: 2.0000 | ORAL_TABLET | Freq: Every day | ORAL | Status: DC

## 2013-06-11 MED ORDER — IBUPROFEN 600 MG PO TABS: 600.0000 mg | ORAL_TABLET | Freq: Four times a day (QID) | ORAL | Status: DC

## 2013-06-11 NOTE — Discharge Summary (Signed)
Obstetric Discharge Summary Reason for Admission: induction of labor for postdates Prenatal Procedures: none Intrapartum Procedures: cesarean: low cervical, transverse Postpartum Procedures: none Complications-Operative and Postpartum: none Hemoglobin  Date Value Range Status  06/09/2013 10.0* 12.0 - 15.0 g/dL Final     DELTA CHECK NOTED     REPEATED TO VERIFY  11/23/2012 14.2   Final     HCT  Date Value Range Status  06/09/2013 29.6* 36.0 - 46.0 % Final  11/23/2012 43   Final    Physical Exam:  General: alert, cooperative and no distress Lochia: appropriate Uterine Fundus: firm Incision: healing well, no significant drainage, no dehiscence, no significant erythema DVT Evaluation: No evidence of DVT seen on physical exam. No cords or calf tenderness. No significant calf/ankle edema.  Discharge Diagnoses: Term Pregnancy-delivered  Discharge Information: Date: 06/11/2013 Activity: pelvic rest Diet: routine Medications: PNV, Ibuprofen, Percocet and Senna Condition: stable Instructions: refer to practice specific booklet Discharge to: home Follow-up Information   Follow up with [facility]. Schedule an appointment as soon as possible for a visit in 3 weeks. (Call and make an appt for 3-4 weeks from now)    Contact information:   [address] [place] [address] [phone]      Newborn Data: Live born female  Birth Weight: 8 lb 8.7 oz (3875 g) APGAR: 8, 9  Home with mother.  Bottle feeding  Tawni Carnes 06/11/2013, 8:00 AM  I have seen and examined this patient and agree with above documentation in the Resident's note.   Pt presented as a post dates IOL. She had a difficult induction and eventually had a LTCS due to failure to dilate and NRFHT.  Pt's labor was also complicated by suspected chorio s/p abx. She did well post partum and remained afebrile.  She was discharged in stable condition on day 3.     Rulon Abide, M.D. [facility] Fellow 06/11/2013  8:56 AM

## 2013-06-13 ENCOUNTER — Telehealth: Payer: Self-pay | Admitting: Obstetrics and Gynecology

## 2013-06-13 NOTE — Telephone Encounter (Signed)
Pt states had a c-section on 06/08/2013. Pt took a shower yesterday and was unable to remove glue at incision site. Pt states glue sticking to her clothes and wants to know what she can do to remove it. Per Joellyn Haff, CNM, pt can use pads to cover up incision site and keep from sticking to clothes. Pt informed not to use anything to remove glue to prevent anything getting into incision site. Call transferred to front staff for an appt to be scheduled in 1-2 weeks to check incision site. Pt verbalized understanding.

## 2013-06-13 NOTE — Telephone Encounter (Signed)
Pt aware that she needs to make an appointment for post partum before getting a note to return to work per [employer].

## 2013-06-19 ENCOUNTER — Encounter: Payer: Self-pay | Admitting: Adult Health

## 2013-06-19 ENCOUNTER — Ambulatory Visit (INDEPENDENT_AMBULATORY_CARE_PROVIDER_SITE_OTHER): Payer: Medicaid Other | Admitting: Adult Health

## 2013-06-19 VITALS — BP 120/80 | Ht 66.0 in | Wt 203.0 lb

## 2013-06-19 DIAGNOSIS — Z1389 Encounter for screening for other disorder: Secondary | ICD-10-CM

## 2013-06-19 DIAGNOSIS — R519 Headache, unspecified: Secondary | ICD-10-CM

## 2013-06-19 DIAGNOSIS — IMO0002 Reserved for concepts with insufficient information to code with codable children: Secondary | ICD-10-CM

## 2013-06-19 DIAGNOSIS — R609 Edema, unspecified: Secondary | ICD-10-CM

## 2013-06-19 HISTORY — DX: Edema, unspecified: R60.9

## 2013-06-19 LAB — POCT URINALYSIS DIPSTICK: Glucose, UA: NEGATIVE

## 2013-06-19 LAB — CBC
Hemoglobin: 10.3 g/dL — ABNORMAL LOW (ref 12.0–15.0)
MCH: 28.5 pg (ref 26.0–34.0)
MCHC: 33.1 g/dL (ref 30.0–36.0)
Platelets: 353 10*3/uL (ref 150–400)

## 2013-06-19 MED ORDER — HYDROCHLOROTHIAZIDE 25 MG PO TABS: 25.0000 mg | ORAL_TABLET | Freq: Every day | ORAL | Status: DC

## 2013-06-19 NOTE — Progress Notes (Signed)
Subjective:     Patient ID: Mary Moses, female   DOB: [birth year], 27 y.o.   MRN: [id number]  HPI Finesse is in complaining of a headache and swelling in her feet and ankles.She had a C-section 06/08/13 girl.She denies any blurred vision, RUQ pain. Motrin helps if she takes it.  Review of Systems Positives in HPI Reviewed past medical,surgical, social and family history. Reviewed medications and allergies.      Objective:   Physical Exam BP 120/80  Ht 5\' 6"  (1.676 m)  Wt 203 lb (92.08 kg)  BMI 32.78 kg/m2  LMP 08/24/2012  Breastfeeding? Nourine 5+blood,trace protein and 2+leuk.  DTRs 1-2 + no clonus, and CN2-12 intact, no RUQ pain, + swelling no pitting in feet and ankles.   Incision together.  Assessment:     Swelling in feet and ankles Headache    Plan:      Rx HCTZ 25 mg #30 1 daily   increase rest Check CBC,CMP,protein-creatinine ratio Followup as scheduled in 6 days If headache changes or vision blurs go to ER

## 2013-06-19 NOTE — Patient Instructions (Addendum)
Increase rest Take HCTZ follow in in 6 days and call about labs in am If headache changes or any blurred vision go to ER

## 2013-06-20 ENCOUNTER — Telehealth: Payer: Self-pay | Admitting: Adult Health

## 2013-06-20 LAB — COMPREHENSIVE METABOLIC PANEL
ALT: 23 U/L (ref 0–35)
AST: 19 U/L (ref 0–37)
Albumin: 3 g/dL — ABNORMAL LOW (ref 3.5–5.2)
Alkaline Phosphatase: 130 U/L — ABNORMAL HIGH (ref 39–117)
Glucose, Bld: 78 mg/dL (ref 70–99)
Potassium: 4 mEq/L (ref 3.5–5.3)
Sodium: 141 mEq/L (ref 135–145)
Total Bilirubin: 0.4 mg/dL (ref 0.3–1.2)
Total Protein: 6.1 g/dL (ref 6.0–8.3)

## 2013-06-20 LAB — PROTEIN / CREATININE RATIO, URINE: Total Protein, Urine: 38 mg/dL

## 2013-06-20 NOTE — Telephone Encounter (Signed)
Pt aware of labs and ned to take Fe and prenatal vitamin she says she feels pretty good, keep appt next week

## 2013-06-25 ENCOUNTER — Ambulatory Visit (INDEPENDENT_AMBULATORY_CARE_PROVIDER_SITE_OTHER): Payer: Medicaid Other | Admitting: Women's Health

## 2013-06-25 ENCOUNTER — Encounter: Payer: Self-pay | Admitting: Women's Health

## 2013-06-25 VITALS — BP 138/70 | Ht 66.0 in | Wt 190.6 lb

## 2013-06-25 DIAGNOSIS — IMO0002 Reserved for concepts with insufficient information to code with codable children: Secondary | ICD-10-CM

## 2013-06-25 DIAGNOSIS — Z3400 Encounter for supervision of normal first pregnancy, unspecified trimester: Secondary | ICD-10-CM

## 2013-06-25 NOTE — Progress Notes (Signed)
Patient ID: Merlyn Lot, female   DOB: 1986/04/05, 27 y.o.   MRN: [id number] Carmelia Tiner is a 28 y.o. 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 No heavy lifting/pulling/pushing involved. Lochia down to light spotting, no incisional pain, bottlefeeding. Taking HCTZ daily as rx'd for bilateral LE edema, now getting better.  O: BP 138/70  Ht 5\' 6"  (1.676 m)  Wt 190 lb 9.6 oz (86.456 kg)  BMI 30.78 kg/m2  Breastfeeding? No       Incision: well approximated and healing nicely, no s/s infection      Fundus firm and ~62fb above SP      BLE edema: trace, DTRs 1+, no clonus  A: 2.5wks s/p PLTCS     Incision healing well     Bottlefeeding     Desires to return to work 8/25  P: Note given, OK to return to work 8/25, no heavy lifting/pushing/pulling >25lbs, listen to body- if begins to hurt, bleeding           increases, etc- decrease activity     Finish up HCTZ 30day Rx     F/U as scheduled on 8/21 for pp visit     Plans nexplanon- understands no sex until nexplanon placed  Cheral Marker, CNM, [facility] 06/25/2013 12:49 PM

## 2013-06-25 NOTE — Patient Instructions (Signed)

## 2013-07-04 ENCOUNTER — Encounter: Payer: Self-pay | Admitting: Advanced Practice Midwife

## 2013-07-04 ENCOUNTER — Ambulatory Visit (INDEPENDENT_AMBULATORY_CARE_PROVIDER_SITE_OTHER): Payer: Medicaid Other | Admitting: Advanced Practice Midwife

## 2013-07-04 NOTE — Progress Notes (Signed)
Mary Moses is a 27 y.o. who presents for a postpartum visit. She is 4 weeks postpartum following a low cervical transverse Cesarean section. I have fully reviewed the prenatal and intrapartum course. The delivery was at 41.1 gestational weeks. She had a failed IOL (got to 7cms for several hours) and developed a NRFHT/chorio.  Anesthesia: epidural. Postpartum course has been uneventful. Baby's course has been uneventful. Baby is feeding by bottle. Bleeding: no bleeding. Bowel function is normal. Bladder function is normal. Patient is not sexually active. Contraception method is Nexplanon. Postpartum depression screening: negative.    Review of Systems   Constitutional: Negative for fever and chills Eyes: Negative for visual disturbances Respiratory: Negative for shortness of breath, dyspnea Cardiovascular: Negative for chest pain or palpitations  Gastrointestinal: Negative for vomiting, diarrhea and constipation Genitourinary: Negative for dysuria and urgency Musculoskeletal: Negative for back pain, joint pain, myalgias  Neurological: Negative for dizziness and headaches   Objective:     Filed Vitals:   07/04/13 0922  BP: 110/68   General:  alert, cooperative and no distress   Breasts:  negative  Lungs: clear to auscultation bilaterally  Heart:  regular rate and rhythm  Abdomen: Soft, nontender; incision healing well   Vulva:  normal  Vagina: normal vagina  Cervix:  closed  Corpus: Well involuted     Rectal Exam: no hemorrhoids        Assessment:    normal postpartum exam.  Plan:    1. Contraception: Nexplanon 2. Follow up in: asap for Nexplanon or as needed.

## 2013-07-09 ENCOUNTER — Encounter: Payer: Self-pay | Admitting: Adult Health

## 2013-07-09 ENCOUNTER — Ambulatory Visit (INDEPENDENT_AMBULATORY_CARE_PROVIDER_SITE_OTHER): Payer: Medicaid Other | Admitting: Adult Health

## 2013-07-09 VITALS — BP 110/60 | Ht 66.0 in | Wt 185.0 lb

## 2013-07-09 DIAGNOSIS — Z30017 Encounter for initial prescription of implantable subdermal contraceptive: Secondary | ICD-10-CM

## 2013-07-09 HISTORY — DX: Encounter for initial prescription of implantable subdermal contraceptive: Z30.017

## 2013-07-09 NOTE — Progress Notes (Signed)
Subjective:     Patient ID: Mary Moses, female   DOB: Jul 11, 1986, 27 y.o.   MRN: [id number]  HPI Jeilyn is here for nexplanon insertion, no sex since delivery.No complaints.  Review of Systems See HPI Reviewed past medical,surgical, social and family history. Reviewed medications and allergies.     Objective:   Physical Exam BP 110/60  Ht 5\' 6"  (1.676 m)  Wt 185 lb (83.915 kg)  BMI 29.87 kg/m2  Breastfeeding? Nourine pregnancy test negative. Consent signed. Left arm cleansed with betadine, and injected with 2.0 cc 2% lidocaine and waited til numb. Nexplanon easily inserted and steri strips applied. Pressure dressing applied.Rod easily palpated by pt and provider.    Assessment:     Nexplanon insertion, exp 11/16, Moses [id number]    Plan:      Use condoms x 2 weeks, keep clean and dry x 24 hours, no heavy lifting, keep steri strips on x 72 hours, Keep pressure dressing on x 24 hours. Follow up prn problems.   Physical in 1 year Return to work full duty 07/15/13, note given at pt request

## 2013-07-09 NOTE — Patient Instructions (Addendum)
Use condoms x 2 weeks, keep clean and dry x 24 hours, no heavy lifting, keep steri strips on x 72 hours, Keep pressure dressing on x 24 hours. Follow up prn problems. Physical in 1 year Return to work 9/1 full duty

## 2013-07-24 ENCOUNTER — Ambulatory Visit: Payer: BC Managed Care – PPO | Admitting: Advanced Practice Midwife

## 2013-10-28 ENCOUNTER — Other Ambulatory Visit: Payer: Self-pay | Admitting: Women's Health

## 2013-12-03 ENCOUNTER — Telehealth: Payer: Self-pay

## 2013-12-03 NOTE — Telephone Encounter (Signed)
Per Joellyn HaffKim Booker, CNM, Prilosec is OTC, pt does not have to have a RX.

## 2014-05-19 ENCOUNTER — Other Ambulatory Visit: Payer: Medicaid Other | Admitting: Adult Health

## 2014-07-02 ENCOUNTER — Other Ambulatory Visit (HOSPITAL_COMMUNITY)
Admission: RE | Admit: 2014-07-02 | Discharge: 2014-07-02 | Disposition: A | Discharge status: Home / Self Care | Payer: BC Managed Care – PPO | Source: Ambulatory Visit | Attending: Adult Health | Admitting: Adult Health

## 2014-07-02 ENCOUNTER — Encounter: Payer: Self-pay | Admitting: Women's Health

## 2014-07-02 ENCOUNTER — Ambulatory Visit (INDEPENDENT_AMBULATORY_CARE_PROVIDER_SITE_OTHER): Payer: BC Managed Care – PPO | Admitting: Women's Health

## 2014-07-02 VITALS — BP 122/58 | Ht 66.0 in | Wt 185.0 lb

## 2014-07-02 DIAGNOSIS — Z01419 Encounter for gynecological examination (general) (routine) without abnormal findings: Secondary | ICD-10-CM

## 2014-07-02 DIAGNOSIS — Z1151 Encounter for screening for human papillomavirus (HPV): Secondary | ICD-10-CM | POA: Diagnosis present

## 2014-07-02 DIAGNOSIS — Z124 Encounter for screening for malignant neoplasm of cervix: Secondary | ICD-10-CM | POA: Insufficient documentation

## 2014-07-02 DIAGNOSIS — Z113 Encounter for screening for infections with a predominantly sexual mode of transmission: Secondary | ICD-10-CM | POA: Diagnosis present

## 2014-07-02 DIAGNOSIS — R87810 Cervical high risk human papillomavirus (HPV) DNA test positive: Secondary | ICD-10-CM | POA: Insufficient documentation

## 2014-07-02 DIAGNOSIS — R51 Headache: Secondary | ICD-10-CM

## 2014-07-02 DIAGNOSIS — R42 Dizziness and giddiness: Secondary | ICD-10-CM

## 2014-07-02 DIAGNOSIS — R519 Headache, unspecified: Secondary | ICD-10-CM

## 2014-07-02 LAB — LIPID PANEL
CHOL/HDL RATIO: 2.9 ratio
CHOLESTEROL: 140 mg/dL (ref 0–200)
HDL: 48 mg/dL (ref >39)
LDL Cholesterol: 81 mg/dL (ref 0–99)
Triglycerides: 54 mg/dL (ref <150)
VLDL: 11 mg/dL (ref 0–40)

## 2014-07-02 LAB — COMPREHENSIVE METABOLIC PANEL
ALK PHOS: 59 U/L (ref 39–117)
ALT: 12 U/L (ref 0–35)
AST: 14 U/L (ref 0–37)
Albumin: 3.9 g/dL (ref 3.5–5.2)
BILIRUBIN TOTAL: 0.3 mg/dL (ref 0.2–1.2)
BUN: 9 mg/dL (ref 6–23)
CO2: 27 mEq/L (ref 19–32)
Calcium: 8.8 mg/dL (ref 8.4–10.5)
Chloride: 105 mEq/L (ref 96–112)
Creat: 0.79 mg/dL (ref 0.50–1.10)
GLUCOSE: 93 mg/dL (ref 70–99)
Potassium: 4.4 mEq/L (ref 3.5–5.3)
SODIUM: 138 meq/L (ref 135–145)
TOTAL PROTEIN: 7.1 g/dL (ref 6.0–8.3)

## 2014-07-02 LAB — CBC
HCT: 37.7 % (ref 36.0–46.0)
HEMOGLOBIN: 12.7 g/dL (ref 12.0–15.0)
MCH: 27.8 pg (ref 26.0–34.0)
MCHC: 33.7 g/dL (ref 30.0–36.0)
MCV: 82.5 fL (ref 78.0–100.0)
PLATELETS: 234 10*3/uL (ref 150–400)
RBC: 4.57 MIL/uL (ref 3.87–5.11)
RDW: 14.2 % (ref 11.5–15.5)
WBC: 8.3 10*3/uL (ref 4.0–10.5)

## 2014-07-02 NOTE — Patient Instructions (Signed)

## 2014-07-02 NOTE — Progress Notes (Signed)
Patient ID: Mary Moses, female   DOB: Dec 06, 1985, 28 y.o.   MRN: [id number] Subjective:   Mary Moses is a 28 y.o. G61P1001 African American female here for a routine well-woman FP Mcaid exam.  Patient's last menstrual period was 04/28/2014.   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  Past Medical History Past Medical History  Diagnosis Date  . Pregnancy   . Obesity   . GERD (gastroesophageal reflux disease)   . Abnormal Pap smear   . Swelling 06/19/2013  . Nexplanon insertion 07/09/2013    nexplanon inserted 8/26 left arm remove 07/09/16    Past Surgical History Past Surgical History  Procedure Laterality Date  . Cesarean section N/A 06/08/2013    Procedure: Primary CESAREAN SECTION of baby girl  at 2056 APGAR 8/9;  Surgeon: Tereso Newcomer, MD;  Location: [facility];  Service: Obstetrics;  Laterality: N/A;    Gynecologic History G1P1001  Patient's last menstrual period was 04/28/2014. Contraception: Nexplanon Last Pap: 2013. Results were: abnormal Last mammogram: never. Results were: n/a Last TCS: never  Obstetric History OB History  Gravida Para Term Preterm AB SAB TAB Ectopic Multiple Living  1 1 1       1     # Outcome Date GA Lbr Len/2nd Weight Sex Delivery Anes PTL Lv  1 TRM 06/08/13 [redacted]w[redacted]d  8 lb 8.7 oz (3.875 kg) F LTCS EPI  Y      Current Medications Current Outpatient  Prescriptions on File Prior to Visit  Medication Sig Dispense Refill  . acetaminophen (TYLENOL) 500 MG tablet Take 500 mg by mouth every 6 (six) hours as needed. For headache or pain      . etonogestrel (NEXPLANON) 68 MG IMPL implant Inject 1 each into the skin once.      Marland Kitchen ibuprofen (ADVIL,MOTRIN) 600 MG tablet Take 1 tablet (600 mg total) by mouth every 6 (six) hours.  30 tablet  0  . omeprazole (PRILOSEC) 20 MG capsule TAKE ONE CAPSULE BY MOUTH ONCE DAILY  30 capsule  0  . Prenatal Vit-Fe Fumarate-FA (PRENATAL MULTIVITAMIN) TABS Take 1 tablet by mouth at bedtime.       Marland Kitchen oxyCODONE-acetaminophen (PERCOCET/ROXICET) 5-325 MG per tablet Take 1-2 tablets by mouth every 6 (six) hours as needed.  30 tablet  0  . senna-docusate (SENOKOT-S) 8.6-50 MG per tablet Take 2 tablets by mouth at bedtime.  30 tablet  0   No current facility-administered medications on file prior to visit.    Review of Systems Patient denies any headaches, blurred vision, shortness of breath, chest pain, abdominal pain, problems with bowel movements, urination, or intercourse.  Objective:  BP 122/58  Ht 5\' 6"  (1.676 m)  Wt 185 lb (83.915 kg)  BMI 29.87 kg/m2  LMP 04/28/2014 Physical Exam  General:  Well developed, well nourished, no acute distress. She is alert and oriented x3. Skin:  Warm and dry, ~5cm circular area on Rt upper back towards neck- peeling flaking skin- and smaller ~  1.5cm circular area w/ different texture on base of neck- states slightly itchy- has used antifungal cream w/o relief, co-exam w/ JAG, possibly ring worm/eczema- recommend referral to derm Neck:  Midline trachea, no thyromegaly or nodules Cardiovascular: Regular rate and rhythm, no murmur heard Lungs:  Effort normal, all lung fields clear to auscultation bilaterally Breasts:  No dominant palpable mass, retraction, or nipple discharge Abdomen:  Soft, non tender, no hepatosplenomegaly or masses Pelvic:  External genitalia is normal in  appearance.  The vagina is normal in appearance. The cervix is bulbous, no CMT.  Thin prep pap is done w/ HR HPV cotesting. Uterus is felt to be normal size, shape, and contour.  No adnexal masses or tenderness noted. Extremities:  No swelling or varicosities noted Psych:  She has a normal mood and affect  Assessment:   Healthy well-woman exam Dizziness/lightheadedness Headaches ?eczema/ringworm on upper back/neck  Plan:  Pt to schedule appt w/ derm to check out areas on upper back/neck CBC, CMP, TSH, lipid panel, HIV, RPR, HSV2, gc/ct from pap F/U 545yr for physical, or sooner if needed Printed info on headaches given Mammogram @30yo  or sooner if problems Colonoscopy @30yo  or sooner if problems  Marge DuncansBooker, Kimberly Randall CNM, [facility] 07/02/2014 9:11 AM

## 2014-07-03 LAB — HIV ANTIBODY (ROUTINE TESTING W REFLEX): HIV 1&2 Ab, 4th Generation: NONREACTIVE

## 2014-07-03 LAB — CYTOLOGY - PAP

## 2014-07-03 LAB — HSV 2 ANTIBODY, IGG: HSV 2 Glycoprotein G Ab, IgG: <0.1 IV

## 2014-07-03 LAB — RPR

## 2014-07-03 LAB — TSH: TSH: 0.657 u[IU]/mL (ref 0.350–4.500)

## 2014-07-12 ENCOUNTER — Encounter: Payer: Self-pay | Admitting: Women's Health

## 2014-07-15 ENCOUNTER — Telehealth: Payer: Self-pay | Admitting: Women's Health

## 2014-07-15 NOTE — Telephone Encounter (Signed)
Notified pt of abnormal pap/need for colpo, also notified of all other normal labs. Switched to front to schedule colpo.  Cheral Marker, CNM, [facility] 07/15/2014 12:50 PM

## 2014-07-24 ENCOUNTER — Ambulatory Visit (INDEPENDENT_AMBULATORY_CARE_PROVIDER_SITE_OTHER): Payer: BC Managed Care – PPO | Admitting: Obstetrics & Gynecology

## 2014-07-24 ENCOUNTER — Encounter: Payer: Self-pay | Admitting: Obstetrics & Gynecology

## 2014-07-24 VITALS — BP 108/80 | Wt 184.0 lb

## 2014-07-24 DIAGNOSIS — Z3202 Encounter for pregnancy test, result negative: Secondary | ICD-10-CM

## 2014-07-24 DIAGNOSIS — N870 Mild cervical dysplasia: Secondary | ICD-10-CM

## 2014-07-24 LAB — POCT URINE PREGNANCY: Preg Test, Ur: NEGATIVE

## 2014-07-24 NOTE — Progress Notes (Signed)
Patient ID: Mary Moses, female   DOB: [birth year], 28 y.o.   MRN: [id number] Colposcopy Procedure Note:  Pap performed:  06/2014 Result:  LSIL + HPV Smoker:  No. New sexual partner:  No.  : time frame:  No.  History of abnormal Pap: yes, 2008, spontaneous resolution of HSILon Pap  Due to the indications listed above, a thorough colposcopic evaluation of the cervix and upper vagina was performed in the usual fashion using 3% acetic acid.  The findings of the visual inspection are noted below:  adequate Acetowhite changes       positive Punctation                      negative Mosaicism                      negative Abnormal Vessels          negative  Biopsy performed:          No.  Complications: no   Impression: HPV atypia  Recommendation: Follow up cytology 1 year      Past Medical History  Diagnosis Date  . Pregnancy   . Obesity   . GERD (gastroesophageal reflux disease)   . Abnormal Pap smear   . Swelling 06/19/2013  . Nexplanon insertion 07/09/2013    nexplanon inserted 8/26 left arm remove 07/09/16    Past Surgical History  Procedure Laterality Date  . Cesarean section N/A 06/08/2013    Procedure: Primary CESAREAN SECTION of baby girl  at 2056 APGAR 8/9;  Surgeon: Tereso Newcomer, MD;  Location: [facility];  Service: Obstetrics;  Laterality: N/A;    OB History   Grav Para Term Preterm Abortions TAB SAB Ect Mult Living   1 1 1       1       Allergies  Allergen Reactions  . Penicillins Nausea And Vomiting    History   Social History  . Marital Status: Single    Spouse Name: N/A    Number of Children: N/A  . Years of Education: N/A   Social History Main Topics  . Smoking status: Never Smoker   . Smokeless tobacco: Never Used  . Alcohol Use: No  . Drug Use: No  . Sexual Activity: Yes    Birth Control/ Protection: None   Other Topics Concern  . None   Social History Narrative  . None    Family History  Problem Relation Age of Onset  .  Kidney disease Mother   . Heart disease Mother   . Cancer Father     metastatic prostate   . Hypertension Other

## 2014-09-15 ENCOUNTER — Encounter: Payer: Self-pay | Admitting: Obstetrics & Gynecology

## 2014-09-30 ENCOUNTER — Encounter: Payer: Self-pay | Admitting: Women's Health

## 2014-09-30 ENCOUNTER — Ambulatory Visit (INDEPENDENT_AMBULATORY_CARE_PROVIDER_SITE_OTHER): Payer: BC Managed Care – PPO | Admitting: Women's Health

## 2014-09-30 VITALS — BP 110/68 | Ht 66.0 in | Wt 190.0 lb

## 2014-09-30 DIAGNOSIS — K5900 Constipation, unspecified: Secondary | ICD-10-CM

## 2014-09-30 DIAGNOSIS — K219 Gastro-esophageal reflux disease without esophagitis: Secondary | ICD-10-CM

## 2014-09-30 DIAGNOSIS — IMO0001 Reserved for inherently not codable concepts without codable children: Secondary | ICD-10-CM

## 2014-09-30 DIAGNOSIS — R87612 Low grade squamous intraepithelial lesion on cytologic smear of cervix (LGSIL): Secondary | ICD-10-CM

## 2014-09-30 MED ORDER — OMEPRAZOLE 20 MG PO CPDR: 20.0000 mg | DELAYED_RELEASE_CAPSULE | Freq: Two times a day (BID) | ORAL | Status: DC

## 2014-09-30 NOTE — Progress Notes (Signed)
Patient ID: Mary Moses, female   DOB: [birth year], 28 y.o.   MRN: [id number]   [facility] Visit  [name] MRN [id number]  Date of birth: 1986-01-11  CC & HPI:  Mary Moses is a 28 y.o. African American female presenting today for report of heartburn- had during pregnancy- has continued, ran out of prilosec rx- was taking daily, feels like she needed it bid. Also constipation- has bm ~3x/wk, strains, stool is hard. Not doing anything for this at this time. Baby is 46 months old. Pt doesn't have PCP.   Pertinent History Reviewed:  Medical & Surgical Hx:   Past Medical History  Diagnosis Date  . Pregnancy   . Obesity   . GERD (gastroesophageal reflux disease)   . Abnormal Pap smear   . Swelling 06/19/2013  . Nexplanon insertion 07/09/2013    nexplanon inserted 8/26 left arm remove 07/09/16   Past Surgical History  Procedure Laterality Date  . Cesarean section N/A 06/08/2013    Procedure: Primary CESAREAN SECTION of baby girl  at 2056 APGAR 8/9;  Surgeon: Tereso Newcomer, MD;  Location: [facility];  Service: Obstetrics;  Laterality: N/A;   Medications: Reviewed & Updated - see associated section Social History: Reviewed -  reports that she has never smoked. She has never used smokeless tobacco.  Objective Findings:  Vitals: BP 110/68 mmHg  Ht 5\' 6"  (1.676 m)  Wt 190 lb (86.183 kg)  BMI 30.68 kg/m2  Physical Examination: General appearance - alert, well appearing, and in no distress  No results found for this or any previous visit (from the past 24 hour(s)).   Assessment & Plan:  A:   Reflux  Constipation P:  Rx prilosec 20mg  BID before meals, gave printed info on foods to avoid  Discussed and gave printed info on constipation prevention/relief measures, to call if not improving   F/U prn, then Aug for pap   [name] CNM, [facility] 09/30/2014 11:08 AM

## 2014-09-30 NOTE — Patient Instructions (Signed)
Constipation  Drink plenty of fluid, preferably water, throughout the day  Eat foods high in fiber such as fruits, vegetables, and grains  Exercise, such as walking, is a good way to keep your bowels regular  Drink warm fluids, especially warm prune juice, or decaf coffee  Eat a 1/2 cup of real oatmeal (not instant), 1/2 cup applesauce, and 1/2-1 cup warm prune juice every day  If needed, you may take Colace (docusate sodium) stool softener once or twice a day to help keep the stool soft. If you are pregnant, wait until you are out of your first trimester (12-14 weeks of pregnancy)  If you still are having problems with constipation, you may take Miralax once daily as needed to help keep your bowels regular.  If you are pregnant, wait until you are out of your first trimester (12-14 weeks of pregnancy)    Food Choices for Gastroesophageal Reflux Disease When you have gastroesophageal reflux disease (GERD), the foods you eat and your eating habits are very important. Choosing the right foods can help ease the discomfort of GERD. WHAT GENERAL GUIDELINES DO I NEED TO FOLLOW? 8. Choose fruits, vegetables, whole grains, low-fat dairy products, and low-fat meat, fish, and poultry. 9. Limit fats such as oils, salad dressings, butter, nuts, and avocado. 10. Keep a food diary to identify foods that cause symptoms. 11. Avoid foods that cause reflux. These may be different for different people. 12. Eat frequent small meals instead of three large meals each day. 13. Eat your meals slowly, in a relaxed setting. 14. Limit fried foods. 15. Cook foods using methods other than frying. 16. Avoid drinking alcohol. 17. Avoid drinking large amounts of liquids with your meals. 18. Avoid bending over or lying down until 2-3 hours after eating. WHAT FOODS ARE NOT RECOMMENDED? The following are some foods and drinks that may worsen your symptoms: Vegetables Tomatoes. Tomato juice. Tomato and spaghetti  sauce. Chili peppers. Onion and garlic. Horseradish. Fruits Oranges, grapefruit, and lemon (fruit and juice). Meats High-fat meats, fish, and poultry. This includes hot dogs, ribs, ham, sausage, salami, and bacon. Dairy Whole milk and chocolate milk. Sour cream. Cream. Butter. Ice cream. Cream cheese.  Beverages Coffee and tea, with or without caffeine. Carbonated beverages or energy drinks. Condiments Hot sauce. Barbecue sauce.  Sweets/Desserts Chocolate and cocoa. Donuts. Peppermint and spearmint. Fats and Oils High-fat foods, including Jamaica[place] fries and potato chips. Other Vinegar. Strong spices, such as black pepper, white pepper, red pepper, cayenne, curry powder, cloves, ginger, and chili powder. The items listed above may not be a complete list of foods and beverages to avoid. Contact your dietitian for more information. Document Released: 10/31/2005 Document Revised: 11/05/2013 Document Reviewed: 09/04/2013 [facility] Patient Information 2015 [employer] [place]. This information is not intended to replace advice given to you by your health care provider. Make sure you discuss any questions you have with your health care provider.

## 2014-11-29 ENCOUNTER — Encounter (HOSPITAL_COMMUNITY): Payer: Self-pay | Admitting: *Deleted

## 2014-11-29 ENCOUNTER — Emergency Department (HOSPITAL_COMMUNITY)
Admission: EM | Admit: 2014-11-29 | Discharge: 2014-11-29 | Disposition: A | Discharge status: Home / Self Care | Payer: BLUE CROSS/BLUE SHIELD | Attending: Emergency Medicine | Admitting: Emergency Medicine

## 2014-11-29 DIAGNOSIS — M436 Torticollis: Secondary | ICD-10-CM | POA: Diagnosis not present

## 2014-11-29 DIAGNOSIS — K219 Gastro-esophageal reflux disease without esophagitis: Secondary | ICD-10-CM | POA: Insufficient documentation

## 2014-11-29 DIAGNOSIS — Z880 Allergy status to penicillin: Secondary | ICD-10-CM | POA: Diagnosis not present

## 2014-11-29 DIAGNOSIS — E669 Obesity, unspecified: Secondary | ICD-10-CM | POA: Insufficient documentation

## 2014-11-29 DIAGNOSIS — Z79899 Other long term (current) drug therapy: Secondary | ICD-10-CM | POA: Diagnosis not present

## 2014-11-29 DIAGNOSIS — M549 Dorsalgia, unspecified: Secondary | ICD-10-CM | POA: Diagnosis present

## 2014-11-29 MED ORDER — NAPROXEN 500 MG PO TABS: 500.0000 mg | ORAL_TABLET | Freq: Two times a day (BID) | ORAL | Status: DC

## 2014-11-29 MED ORDER — CYCLOBENZAPRINE HCL 10 MG PO TABS: 10.0000 mg | ORAL_TABLET | Freq: Two times a day (BID) | ORAL | Status: DC | PRN

## 2014-11-29 NOTE — ED Notes (Signed)
Pt seen and eval by EDNP for initial assessment. 

## 2014-11-29 NOTE — ED Provider Notes (Signed)
CSN: [id number]     Arrival date & time 11/29/14  1654 History   First MD Initiated Contact with Patient 11/29/14 1714     Chief Complaint  Patient presents with  . Back Pain     (Consider location/radiation/quality/duration/timing/severity/associated sxs/prior Treatment) The history is provided by the patient.   Mary Moses is a 29 y.o. female who presents to the ED with neck and back pain that started a week ago. She thinks she may have strained a muscle. She does not remember any injury. She first noted the pain last week after waking. She describes the pain as throbbing pain that comes and goes. The pain is worse in the morning and gets better during the day but when she turns her neck the causes the pain. The pain is on both side of the neck and goes to the upper back. She has not taken anything for pain. She denies decreased strength.   Past Medical History  Diagnosis Date  . Pregnancy   . Obesity   . GERD (gastroesophageal reflux disease)   . Abnormal Pap smear   . Swelling 06/19/2013  . Nexplanon insertion 07/09/2013    nexplanon inserted 8/26 left arm remove 07/09/16   Past Surgical History  Procedure Laterality Date  . Cesarean section N/A 06/08/2013    Procedure: Primary CESAREAN SECTION of baby girl  at 2056 APGAR 8/9;  Surgeon: Tereso Newcomer, MD;  Location: [facility];  Service: Obstetrics;  Laterality: N/A;   Family History  Problem Relation Age of Onset  . Kidney disease Mother   . Heart disease Mother   . Cancer Father     metastatic prostate   . Hypertension Other    History  Substance Use Topics  . Smoking status: Never Smoker   . Smokeless tobacco: Never Used  . Alcohol Use: No   OB History    Gravida Para Term Preterm AB TAB SAB Ectopic Multiple Living   1 1 1       1      Review of Systems Negative except as stated in HPI   Allergies  Penicillins  Home Medications   Prior to Admission medications   Medication Sig Start Date End Date  Taking? Authorizing Provider  etonogestrel (NEXPLANON) 68 MG IMPL implant Inject 1 each into the skin once.   Yes Historical Provider, MD  omeprazole (PRILOSEC) 20 MG capsule Take 1 capsule (20 mg total) by mouth 2 (two) times daily before a meal. 09/30/14  Yes [name], CNM  acetaminophen (TYLENOL) 500 MG tablet Take 500 mg by mouth every 6 (six) hours as needed. For headache or pain    Historical Provider, MD  cyclobenzaprine (FLEXERIL) 10 MG tablet Take 1 tablet (10 mg total) by mouth 2 (two) times daily as needed for muscle spasms. 11/29/14   Hope Orlene Och, NP  naproxen (NAPROSYN) 500 MG tablet Take 1 tablet (500 mg total) by mouth 2 (two) times daily. 11/29/14   Hope Orlene Och, NP  oxyCODONE-acetaminophen (PERCOCET/ROXICET) 5-325 MG per tablet Take 1-2 tablets by mouth every 6 (six) hours as needed. 06/11/13   Nani Ravens, MD  Prenatal Vit-Fe Fumarate-FA (PRENATAL MULTIVITAMIN) TABS Take 1 tablet by mouth at bedtime.     Historical Provider, MD  senna-docusate (SENOKOT-S) 8.6-50 MG per tablet Take 2 tablets by mouth at bedtime. 06/11/13   Nani Ravens, MD   BP 117/72 mmHg  Pulse 83  Temp(Src) 98.5 F (36.9 C) (Oral)  Resp  18  Ht  (1.676 m)  Wt 190 lb (86.183 kg)  BMI 30.68 kg/m2  SpO2 100% Physical Exam  Constitutional: She is oriented to person, place, and time. She appears well-developed and well-nourished. No distress.  HENT:  Head: Normocephalic and atraumatic.  Right Ear: Tympanic membrane normal.  Left Ear: Tympanic membrane normal.  Nose: Nose normal.  Mouth/Throat: Uvula is midline, oropharynx is clear and moist and mucous membranes are normal.  Eyes: EOM are normal.  Neck: Normal range of motion. Neck supple.  Cardiovascular: Normal rate and regular rhythm.   Pulmonary/Chest: Effort normal. She has no wheezes. She has no rales.  Abdominal: Soft. Bowel sounds are normal. There is no tenderness.  Musculoskeletal: Normal range of motion.       Cervical  back: She exhibits tenderness and spasm. She exhibits no bony tenderness and normal pulse. Decreased range of motion: due to pain.       Back:  There is pain and muscle spasm noted on exam with the majority of the tenderness over the right SCM muscle. Pain increases with range of motion of the neck. The pain radiates to the right thoracic area.   Neurological: She is alert and oriented to person, place, and time. She has normal strength. No cranial nerve deficit or sensory deficit. She displays a negative Romberg sign. Gait normal.  Reflex Scores:      Bicep reflexes are 2+ on the right side and 2+ on the left side.      Brachioradialis reflexes are 2+ on the right side and 2+ on the left side.      Patellar reflexes are 2+ on the right side and 2+ on the left side.      Achilles reflexes are 2+ on the right side and 2+ on the left side. Skin: Skin is warm and dry.  Psychiatric: She has a normal mood and affect. Her behavior is normal.  Nursing note and vitals reviewed.   ED Course  Procedures   MDM  29 y.o. female with muscle spasm and pain to the upper back and neck. Stable for discharge without neuro deficits. Discussed with the patient and all questioned fully answered. She will return if any problems arise.  Will treat with NSAIDS and muscle relaxants.  Final diagnoses:  Torticollis, acute      [facility], NP 11/29/14 1748  Gerhard Munch, MD 11/29/14 2101

## 2014-11-29 NOTE — ED Notes (Signed)
C/o pain in neck and back for over a week, thinks she may have strained a muscle

## 2014-11-29 NOTE — Discharge Instructions (Signed)
Do not take the muscle relaxant if driving as it may make you sleepy.

## 2015-09-16 ENCOUNTER — Telehealth: Payer: Self-pay | Admitting: Women's Health

## 2015-09-16 ENCOUNTER — Ambulatory Visit (INDEPENDENT_AMBULATORY_CARE_PROVIDER_SITE_OTHER): Payer: 59 | Admitting: Adult Health

## 2015-09-16 ENCOUNTER — Encounter: Payer: Self-pay | Admitting: Adult Health

## 2015-09-16 VITALS — BP 120/64 | HR 84 | Ht 66.0 in | Wt 196.0 lb

## 2015-09-16 DIAGNOSIS — Z975 Presence of (intrauterine) contraceptive device: Secondary | ICD-10-CM

## 2015-09-16 DIAGNOSIS — Z3202 Encounter for pregnancy test, result negative: Secondary | ICD-10-CM

## 2015-09-16 DIAGNOSIS — Z1389 Encounter for screening for other disorder: Secondary | ICD-10-CM | POA: Diagnosis not present

## 2015-09-16 DIAGNOSIS — R140 Abdominal distension (gaseous): Secondary | ICD-10-CM | POA: Insufficient documentation

## 2015-09-16 DIAGNOSIS — R252 Cramp and spasm: Secondary | ICD-10-CM

## 2015-09-16 DIAGNOSIS — N911 Secondary amenorrhea: Secondary | ICD-10-CM | POA: Diagnosis not present

## 2015-09-16 DIAGNOSIS — T50905A Adverse effect of unspecified drugs, medicaments and biological substances, initial encounter: Secondary | ICD-10-CM | POA: Insufficient documentation

## 2015-09-16 HISTORY — DX: Presence of (intrauterine) contraceptive device: Z97.5

## 2015-09-16 HISTORY — DX: Cramp and spasm: R25.2

## 2015-09-16 HISTORY — DX: Secondary amenorrhea: N91.1

## 2015-09-16 HISTORY — DX: Abdominal distension (gaseous): R14.0

## 2015-09-16 HISTORY — DX: Adverse effect of unspecified drugs, medicaments and biological substances, initial encounter: T50.905A

## 2015-09-16 LAB — POCT URINALYSIS DIPSTICK
Blood, UA: NEGATIVE
GLUCOSE UA: NEGATIVE
Leukocytes, UA: NEGATIVE
Nitrite, UA: NEGATIVE
Protein, UA: NEGATIVE

## 2015-09-16 LAB — POCT URINE PREGNANCY: PREG TEST UR: NEGATIVE

## 2015-09-16 NOTE — Progress Notes (Signed)
Subjective:     Patient ID: Mary Moses, female   DOB: [birth year], 29 y.o.   MRN: [id number]  HPI Mary Moses is a 29 year old black female,G1P1 with nexplanon, in complaining of abdominal bloating and cramps in hands,feet and toes and legs, has some occasional constipation and heartburn, has not had a period in over 6 months.Was seen at health dept in [facility]. Cramps worse at night and has had for over a month and nothing helps.  Review of Systems Patient denies any headaches, hearing loss, fatigue, blurred vision, shortness of breath, chest pain, abdominal pain, problems with bowel movements, urination, or intercourse. No joint pain or mood swings.See HPI for positives. Reviewed past medical,surgical, social and family history. Reviewed medications and allergies.     Objective:   Physical Exam BP 120/64 mmHg  Pulse 84  Ht  (1.676 m)  Wt 196 lb (88.905 kg)  BMI 31.65 kg/m2 UPT negative,urine dipstick negative, Skin warm and dry.Pelvic: external genitalia is normal in appearance no lesions, vagina: scant discharge without odor,urethra has no lesions or masses noted, cervix:smooth and bulbous, uterus: normal size, shape and contour, non tender, no masses felt, adnexa: no masses or tenderness noted. Bladder is non tender and no masses felt. Wet prep:    Abdominal soft, non tender, no HSM has good bowel sounds in all four quadrants. No swelling or tenderness of hands or legs.  Assessment:     Cramps in extremities Abdominal bloating Nexplanon in place Amenorrhea due to nexplanon     Plan:     Check CBC,CMP,TSH Abdominal US 11/8 at 10:30 at [facility] Follow up with me 11/10 Review handout on cramps

## 2015-09-16 NOTE — Patient Instructions (Signed)
[place] 11/8 at [facility] at 10:30 be there 15 minutes early do not eat after midnight or drink Follow up 11/10 Leg Cramps Leg cramps occur when a muscle or muscles tighten and you have no control over this tightening (involuntary muscle contraction). Muscle cramps can develop in any muscle, but the most common place is in the calf muscles of the leg. Those cramps can occur during exercise or when you are at rest. Leg cramps are painful, and they may last for a few seconds to a few minutes. Cramps may return several times before they finally stop. Usually, leg cramps are not caused by a serious medical problem. In many cases, the cause is not known. Some common causes include:  Overexertion.  Overuse from repetitive motions, or doing the same thing over and over.  Remaining in a certain position for a long period of time.  Improper preparation, form, or technique while performing a sport or an activity.  Dehydration.  Injury.  Side effects of some medicines.  Abnormally low levels of the salts and ions in your blood (electrolytes), especially potassium and calcium. These levels could be low if you are taking water pills (diuretics) or if you are pregnant. HOME CARE INSTRUCTIONS Watch your condition for any changes. Taking the following actions may help to lessen any discomfort that you are feeling:  Stay well-hydrated. Drink enough fluid to keep your urine clear or pale yellow.  Try massaging, stretching, and relaxing the affected muscle. Do this for several minutes at a time.  For tight or tense muscles, use a warm towel, heating pad, or hot shower water directed to the affected area.  If you are sore or have pain after a cramp, applying ice to the affected area may relieve discomfort.  Put ice in a plastic bag.  Place a towel between your skin and the bag.  Leave the ice on for 20 minutes, 2-3 times per day.  Avoid strenuous exercise for several days if you have been having  frequent leg cramps.  Make sure that your diet includes the essential minerals for your muscles to work normally.  Take medicines only as directed by your health care provider. SEEK MEDICAL CARE IF:  Your leg cramps get more severe or more frequent, or they do not improve over time.  Your foot becomes cold, numb, or blue.   This information is not intended to replace advice given to you by your health care provider. Make sure you discuss any questions you have with your health care provider.   Document Released: 12/08/2004 Document Revised: 03/17/2015 Document Reviewed: 10/08/2014 Elsevier Interactive Patient Education [employer]

## 2015-09-17 ENCOUNTER — Telehealth: Payer: Self-pay | Admitting: Adult Health

## 2015-09-17 LAB — COMPREHENSIVE METABOLIC PANEL
ALK PHOS: 64 IU/L (ref 39–117)
ALT: 15 IU/L (ref 0–32)
AST: 19 IU/L (ref 0–40)
Albumin/Globulin Ratio: 1.1 (ref 1.1–2.5)
Albumin: 4.1 g/dL (ref 3.5–5.5)
BUN / CREAT RATIO: 10 (ref 8–20)
BUN: 8 mg/dL (ref 6–20)
Bilirubin Total: 0.3 mg/dL (ref 0.0–1.2)
CALCIUM: 9.2 mg/dL (ref 8.7–10.2)
CHLORIDE: 99 mmol/L (ref 97–106)
CO2: 28 mmol/L (ref 18–29)
CREATININE: 0.82 mg/dL (ref 0.57–1.00)
GFR calc non Af Amer: 96 mL/min/{1.73_m2} (ref >59)
GFR, EST AFRICAN AMERICAN: 110 mL/min/{1.73_m2} (ref >59)
GLOBULIN, TOTAL: 3.6 g/dL (ref 1.5–4.5)
GLUCOSE: 92 mg/dL (ref 65–99)
POTASSIUM: 4.2 mmol/L (ref 3.5–5.2)
Sodium: 138 mmol/L (ref 136–144)
Total Protein: 7.7 g/dL (ref 6.0–8.5)

## 2015-09-17 LAB — CBC
Hematocrit: 39.3 % (ref 34.0–46.6)
Hemoglobin: 13 g/dL (ref 11.1–15.9)
MCH: 28 pg (ref 26.6–33.0)
MCHC: 33.1 g/dL (ref 31.5–35.7)
MCV: 85 fL (ref 79–97)
Platelets: 224 10*3/uL (ref 150–379)
RBC: 4.65 x10E6/uL (ref 3.77–5.28)
RDW: 14 % (ref 12.3–15.4)
WBC: 8.4 10*3/uL (ref 3.4–10.8)

## 2015-09-17 LAB — TSH: TSH: 0.703 u[IU]/mL (ref 0.450–4.500)

## 2015-09-17 NOTE — Telephone Encounter (Signed)
Pt aware of labs keep US appt 

## 2015-09-22 ENCOUNTER — Ambulatory Visit (HOSPITAL_COMMUNITY)
Admission: RE | Admit: 2015-09-22 | Discharge: 2015-09-22 | Disposition: A | Discharge status: Home / Self Care | Payer: 59 | Source: Ambulatory Visit | Attending: Adult Health | Admitting: Adult Health

## 2015-09-22 DIAGNOSIS — R109 Unspecified abdominal pain: Secondary | ICD-10-CM | POA: Diagnosis not present

## 2015-09-22 DIAGNOSIS — R140 Abdominal distension (gaseous): Secondary | ICD-10-CM | POA: Diagnosis not present

## 2015-09-23 ENCOUNTER — Telehealth: Payer: Self-pay | Admitting: Adult Health

## 2015-09-23 NOTE — Telephone Encounter (Signed)
Pt aware of US results. 

## 2015-09-24 ENCOUNTER — Ambulatory Visit: Payer: 59 | Admitting: Adult Health

## 2015-09-25 ENCOUNTER — Encounter: Payer: Self-pay | Admitting: Adult Health

## 2015-09-25 ENCOUNTER — Ambulatory Visit (INDEPENDENT_AMBULATORY_CARE_PROVIDER_SITE_OTHER): Payer: 59 | Admitting: Adult Health

## 2015-09-25 VITALS — BP 120/70 | HR 94 | Ht 66.0 in | Wt 198.0 lb

## 2015-09-25 DIAGNOSIS — R252 Cramp and spasm: Secondary | ICD-10-CM | POA: Diagnosis not present

## 2015-09-25 DIAGNOSIS — R140 Abdominal distension (gaseous): Secondary | ICD-10-CM | POA: Diagnosis not present

## 2015-09-25 NOTE — Progress Notes (Signed)
Subjective:     Patient ID: Mary Moses, female   DOB: 30-Jul-1986, 29 y.o.   MRN: [id number]  HPI Mary Moses is a 29 year old black female in for follow up of cramps in legs and abdominal bloating.  Review of Systems +muscle cramps and abdominal bloating,some constipation,all other symptoms negative Reviewed past medical,surgical, social and family history. Reviewed medications and allergies.     Objective:   Physical Exam BP 120/70 mmHg  Pulse 94  Ht  (1.676 m)  Wt 198 lb (89.812 kg)  BMI 31.97 kg/m2Abdomen soft, non tender, reviewed [place] and labs again,they were normal, no swelling in extremities noted.    Assessment:     Abdominal bloating Muscle cramps in legs    Plan:       Try magnesium 250 mg 1 daily Try yellow mustard Referred to Dr Mary Moses for evaluation  Follow up prn

## 2015-09-25 NOTE — Patient Instructions (Addendum)
Referred to Dr Darrick Penna Try magnesium OTC 250 mg 1 daily Follow up prn  Try yellow mustard

## 2015-09-30 ENCOUNTER — Encounter: Payer: Self-pay | Admitting: Gastroenterology

## 2015-10-15 ENCOUNTER — Other Ambulatory Visit: Payer: Self-pay | Admitting: Women's Health

## 2015-10-22 ENCOUNTER — Ambulatory Visit: Payer: 59 | Admitting: Gastroenterology

## 2015-10-29 ENCOUNTER — Ambulatory Visit: Payer: 59 | Admitting: Gastroenterology

## 2015-11-05 ENCOUNTER — Ambulatory Visit: Payer: 59 | Admitting: Gastroenterology

## 2015-12-09 ENCOUNTER — Encounter: Payer: Self-pay | Admitting: Gastroenterology

## 2015-12-09 ENCOUNTER — Ambulatory Visit (INDEPENDENT_AMBULATORY_CARE_PROVIDER_SITE_OTHER): Payer: 59 | Admitting: Gastroenterology

## 2015-12-09 VITALS — BP 114/67 | HR 87 | Temp 98.6°F | Ht 66.0 in | Wt 204.0 lb

## 2015-12-09 DIAGNOSIS — R140 Abdominal distension (gaseous): Secondary | ICD-10-CM

## 2015-12-09 MED ORDER — LINACLOTIDE 145 MCG PO CAPS: ORAL_CAPSULE | ORAL | Status: DC

## 2015-12-09 MED ORDER — NORTRIPTYLINE HCL 10 MG PO CAPS: ORAL_CAPSULE | ORAL | Status: DC

## 2015-12-09 NOTE — Progress Notes (Signed)
NOT PCP PER PATIENT

## 2015-12-09 NOTE — Patient Instructions (Signed)
DRINK WATER TO KEEP YOUR URINE LIGHT YELLOW.  TO PREVENT CONSTIPATION, ADD LINZESS 30 MINS PRIOR TO BREAKFAST. PLACE IN 4 TBSP WATER. OPEN LINZESS CAPSULE. STIR IT FOR ONE MINUTE AND TAKE 2 TBSP LIQUID ONLY. IT CAN CAUSE EXPLOSIVE DIARRHEA.  TO TREAT BLOATING: 1. AVOID CHEESE OR ADD LACTASE 3 PILLS WITH MEALS THREE TIMES A DAY. 2. CUT OUT THE NUTRI-GRAIN BARS FOR 7 DAYS 3. ADD PAMELOR 1 AT BEDTIME FOR 7 DAYS THEN 2 AT BEDTIME. IT MAY CAUSE DROWSINESS, DRY EYES/MOUTH, BLURRY VISION, OR DIFFICULTY URINATING. REDUCE DOSE FOR DAYTIME DROWSINESS.   PLEASE CALL WITH QUESTIONS OR CONCERNS.   FOLLOW UP IN 3 MOS.

## 2015-12-09 NOTE — Progress Notes (Signed)
ON RECALL  °

## 2015-12-09 NOTE — Progress Notes (Signed)
Subjective:    Patient ID: Mary Moses, female    DOB: 11-03-1986, 30 y.o.   MRN: [id number]  No PCP Per Patient   HPI 2014: 215 LBS. 2015: 185 LBS. TODAY AS AN NEW PT: 204 LBS. If drinks coffEe goes every day but if drINKS EVERY DAY THEN CAN'T SLEEP. SYMPTOMS 2 YEARS AGO. NOW BOWELS MOVING BETTER THIS WEEK BECAUSE SHE'S DRINKING COFFEE. COMPLETES EMPTYING. MAY HAVE WATERY  STOOLS. DRINK WATER: 3-4 BOTTLES A DAY). DOESN'T REALLY LIKE FIBER. HAD A TOSSED SALAD. BREAKFAST: DOESN'T EAT BREAKFAST. DRINKS CRANBERRY GRAPE JUICE.  Tried MOM-didn't work. BOTHERS HER THAT BOWELS DON'T MOVE. FEELS BLOATED-FEEL LIKE SHE'S ABOUT TO BUST LIKE SHE'S PREGNANT. TRIGGERS: ?? BETTER: NOTHING. MAY KEEP HER AWAKE AT NIGHT. STAYS COLD. USES ALEVE: 1-2X/MO. RARE NAUSEA. HEARTBURN: CONTROLLED ON BID PPI FOR ~2 YEARS. JUST PRESSURE AND MAY HAVE ABDOMINAL PAIN IF SHE DOESN'T HAVE A BM. APPETITE: EATS 1-2X A DAY. SNACKS: NUTRI-GRAIN BARS FOOD LION BRAND, CHIPS, CRACKER. MILK: NO CHEESE: LOVES IT. ICE CREAM: NO. RARE CHEST PAIN. STRESS: FAMILY. GETS NERVOUS WHEN SHE HAS TO GO TO THE DOCTOR AND ABOUT FAMILY & GETS DIARRHEA.   PT DENIES FEVER, CHILLS, HEMATOCHEZIA, HEMATEMESIS, nausea, vomiting, melena, SHORTNESS OF BREATH,  CHANGE IN BOWEL IN HABITS, constipation, abdominal pain, problems swallowing, problems with sedation, OR heartburn or indigestion.  Past Medical History  Diagnosis Date  . Pregnancy   . Obesity   . GERD (gastroesophageal reflux disease)   . Abnormal Pap smear   . Swelling 06/19/2013  . Nexplanon insertion 07/09/2013    nexplanon inserted 8/26 left arm remove 07/09/16  . Cramps, extremity 09/16/2015  . Abdominal bloating 09/16/2015  . Nexplanon in place 09/16/2015  . Drug induced amenorrhea 09/16/2015    Has nexplanon   Past Surgical History  Procedure Laterality Date  . Cesarean section N/A 06/08/2013    Procedure: Primary CESAREAN SECTION of baby girl  at 2056 APGAR 8/9;  Surgeon: Tereso Newcomer, MD;  Location: [facility];  Service: Obstetrics;  Laterality: N/A;    Allergies  Allergen Reactions  . Penicillins Nausea And Vomiting   Current Outpatient Prescriptions  Medication Sig Dispense Refill  . acetaminophen (TYLENOL) 500 MG tablet Take 500 mg by mouth every 6 (six) hours as needed. For headache or pain    . etonogestrel (NEXPLANON) 68 MG IMPL implant Inject 1 each into the skin once.    Marland Kitchen omeprazole (PRILOSEC) 20 MG capsule TAKE ONE CAPSULE BY MOUTH TWICE DAILY BEFORE MEAL(S)     Family History  Problem Relation Age of Onset  . Kidney disease Mother   . Heart disease Mother   . Hypertension Mother   . Cancer Father     metastatic prostate   . Hypertension Sister    Social History  Substance Use Topics  . Smoking status: Never Smoker   . Smokeless tobacco: Never Used  . Alcohol Use: No  ONE KID, SINGLE. WORKS AT [employer]. BORN IN [place] NOW LIVES IN [place](2 YEARS).  Review of Systems PER HPI OTHERWISE ALL SYSTEMS ARE NEGATIVE.    Objective:   Physical Exam  Constitutional: She is oriented to person, place, and time. She appears well-developed and well-nourished. No distress.  HENT:  Head: Normocephalic and atraumatic.  Mouth/Throat: Oropharynx is clear and moist. No oropharyngeal exudate.  Eyes: Pupils are equal, round, and reactive to light. No scleral icterus.  Neck: Normal range of motion. Neck supple.  Cardiovascular: Normal rate, regular rhythm  and normal heart sounds.   Pulmonary/Chest: Effort normal and breath sounds normal. No respiratory distress.  Abdominal: Soft. Bowel sounds are normal. She exhibits no distension. There is no tenderness.  Musculoskeletal: She exhibits no edema.  Lymphadenopathy:    She has no cervical adenopathy.  Neurological: She is alert and oriented to person, place, and time.  NO FOCAL DEFICITS  Psychiatric:  FLAT AFFECT, SLIGHTLY ANXIOUS MOOD  Vitals reviewed.     Assessment & Plan:

## 2015-12-09 NOTE — Assessment & Plan Note (Addendum)
ASSOCIATED WITH STRESS,CONSTIPATION, AN DAIRY INTAKE. ETIOLOGY MULTIFACTORIAL FUNCTIONAL DYSPESIA, CONSTIPATION, AND LACTOS EINTOLERANCE.  DRINK WATER TO KEEP YOUR URINE LIGHT YELLOW. TO PREVENT CONSTIPATION, ADD LINZESS 30 MINS PRIOR TO BREAKFAST #4 SAMPLES GIVEN. PLACE IN 4 TBSP WATER. OPEN LINZESS CAPSULE. STIR IT FOR ONE MINUTE AND TAKE 2 TBSP LIQUID ONLY. IT CAN CAUSE EXPLOSIVE DIARRHEA. TO TREAT BLOATING: 1. AVOID CHEESE OR ADD LACTASE 3 PILLS WITH MEALS THREE TIMES A DAY. 2. CUT OUT THE NUTRI-GRAIN BARS FOR 7 DAYS 3. ADD PAMELOR 1 AT BEDTIME FOR 7 DAYS THEN 2 AT BEDTIME. IT MAY CAUSE DROWSINESS, DRY EYES/MOUTH, BLURRY VISION, OR DIFFICULTY URINATING. REDUCE DOSE FOR DAYTIME DROWSINESS.  PLEASE CALL WITH QUESTIONS OR CONCERNS. FOLLOW UP IN 3 MOS.   GREATER THAN 50% WAS SPENT IN COUNSELING & COORDINATION OF CARE WITH THE PATIENT: DISCUSSED DIFFERENTIAL DIAGNOSIS, BENEFITS, RISKS, AND MANAGEMENT OF DYSPEPSIA, BLOATING, AND CONSTIPATION. TOTAL ENCOUNTER TIME: 45 MINS.

## 2015-12-14 ENCOUNTER — Telehealth: Payer: Self-pay | Admitting: Gastroenterology

## 2015-12-14 NOTE — Telephone Encounter (Signed)
Pt is aware that Mary Moses is working on the [place].

## 2015-12-14 NOTE — Telephone Encounter (Signed)
[phone] PATIENT STATES THAT [facility] IN [place] IS WAITING ON SOMETHING FROM [place] TO FILL HER LINZESS.  I TOLD HER IT IS PROBABLY PRIOR APPROVAL FOR HER INSURANCE

## 2016-02-03 ENCOUNTER — Encounter: Payer: Self-pay | Admitting: Gastroenterology

## 2016-02-03 ENCOUNTER — Ambulatory Visit: Payer: 59 | Admitting: Advanced Practice Midwife

## 2016-03-10 ENCOUNTER — Emergency Department (HOSPITAL_COMMUNITY)
Admission: EM | Admit: 2016-03-10 | Discharge: 2016-03-10 | Disposition: A | Discharge status: Home / Self Care | Payer: 59 | Attending: Emergency Medicine | Admitting: Emergency Medicine

## 2016-03-10 ENCOUNTER — Emergency Department (HOSPITAL_COMMUNITY): Payer: 59

## 2016-03-10 ENCOUNTER — Encounter (HOSPITAL_COMMUNITY): Payer: Self-pay | Admitting: Emergency Medicine

## 2016-03-10 DIAGNOSIS — Y990 Civilian activity done for income or pay: Secondary | ICD-10-CM | POA: Diagnosis not present

## 2016-03-10 DIAGNOSIS — Y92199 Unspecified place in other specified residential institution as the place of occurrence of the external cause: Secondary | ICD-10-CM | POA: Diagnosis not present

## 2016-03-10 DIAGNOSIS — S83004A Unspecified dislocation of right patella, initial encounter: Secondary | ICD-10-CM | POA: Diagnosis not present

## 2016-03-10 DIAGNOSIS — Z79899 Other long term (current) drug therapy: Secondary | ICD-10-CM | POA: Diagnosis not present

## 2016-03-10 DIAGNOSIS — Y9302 Activity, running: Secondary | ICD-10-CM | POA: Diagnosis not present

## 2016-03-10 DIAGNOSIS — E669 Obesity, unspecified: Secondary | ICD-10-CM | POA: Insufficient documentation

## 2016-03-10 DIAGNOSIS — X58XXXA Exposure to other specified factors, initial encounter: Secondary | ICD-10-CM | POA: Diagnosis not present

## 2016-03-10 DIAGNOSIS — S8991XA Unspecified injury of right lower leg, initial encounter: Secondary | ICD-10-CM | POA: Diagnosis present

## 2016-03-10 MED ORDER — NAPROXEN 250 MG PO TABS
500.0000 mg | ORAL_TABLET | Freq: Once | ORAL | Status: AC
  Administered 2016-03-10: 500 mg via ORAL
  Filled 2016-03-10: qty 2

## 2016-03-10 MED ORDER — NAPROXEN 500 MG PO TABS: 500.0000 mg | ORAL_TABLET | Freq: Two times a day (BID) | ORAL | Status: DC

## 2016-03-10 NOTE — ED Notes (Signed)
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  Ambulatory since incident.

## 2016-03-10 NOTE — Discharge Instructions (Signed)
Patellar Dislocation A patellar dislocation occurs when your kneecap (patella) slips out of its normal position in a groove in front of the lower end of your thighbone (femur). This groove is called the patellofemoral groove.  CAUSES The kneecap is normally positioned over the front of the knee joint at the base of the thighbone. A kneecap can be dislocated when:  The kneecap is out of place (patellar tracking disorder), and force is applied.  The foot is firmly planted pointing outward, and the knee bends with the thigh turned inward. This kind of injury is common during many sports activities.  The inner edge of the kneecap is hit, pushing it toward the outer side of the leg. SIGNS AND SYMPTOMS  Severe pain.  A misshapen knee that looks like a bone is out of position.  A popping sensation, followed by a feeling that something is out of place.  Inability to bend or straighten the knee.  Knee swelling.  Cool, pale skin or numbness and tingling in or below the affected knee. DIAGNOSIS  Your health care provider will physically examine the injured area. An X-ray exam may be done to make sure a bone fracture has not occurred. In some cases, your health care provider may look inside your knee joint with an instrument much like a pencil-sized telescope (arthroscope). This may be done to make sure you have no loose cartilage in your joint. Loose cartilage is not visible on an X-ray image. TREATMENT  In many instances, the patella can be guided back into position without much difficulty. It often goes back into position by straightening the leg. Often, nothing more may be needed other than a brief period of immobilization followed by the exercises your health care provider recommends. If patellar dislocation starts to become frequent after the first incident, surgery may be needed to prevent your patella from slipping out of place. HOME CARE INSTRUCTIONS   Only take over-the-counter or  prescription medicines for pain, discomfort, or fever as directed by your health care provider.  Use a knee brace if directed to do so by your health care provider.  Use crutches as instructed.  Apply ice to the injured knee:  Put ice in a plastic bag.  Place a towel between your skin and the bag.  Leave the ice on for 20 minutes, 2-3 times a day.  Follow your health care provider's instructions for doing any recommended range-of-motion exercises or other exercises. SEEK IMMEDIATE MEDICAL CARE IF:  You have increased pain or swelling in the knee that is not relieved with medicine.  You have increasing inflammation in the knee.  You have locking or catching of your knee. MAKE SURE YOU:  Understand these instructions.  Will watch your condition.  Will get help right away if you are not doing well or get worse.   This information is not intended to replace advice given to you by your health care provider. Make sure you discuss any questions you have with your health care provider.   Document Released: 07/26/2001 Document Revised: 08/21/2013 Document Reviewed: 06/12/2013 Elsevier Interactive Patient Education [employer]

## 2016-03-10 NOTE — ED Provider Notes (Signed)
CSN: [id number]     Arrival date & time 03/10/16  1023 History   First MD Initiated Contact with Patient 03/10/16 1100     Chief Complaint  Patient presents with  . Knee Injury     (Consider location/radiation/quality/duration/timing/severity/associated sxs/prior Treatment) The history is provided by the patient.   Mary Moses is a 30 y.o. female  who describes chronic intermittent problems with her right knee, describing infrequently will have a dislocated kneecap.  Last night while at work she ran to catch her ringing phone when her kneecap dislocated causing fall and persistent pain since the event.  She describes it sliding medially then spontaneously returning to its position.  She has been able to weight-bear but with pain since event.  She has taken Tylenol and applied ice with no significant improvement.   Past Medical History  Diagnosis Date  . Pregnancy   . Obesity   . GERD (gastroesophageal reflux disease)   . Abnormal Pap smear   . Swelling 06/19/2013  . Nexplanon insertion 07/09/2013    nexplanon inserted 8/26 left arm remove 07/09/16  . Cramps, extremity 09/16/2015  . Abdominal bloating 09/16/2015  . Nexplanon in place 09/16/2015  . Drug induced amenorrhea 09/16/2015    Has nexplanon   Past Surgical History  Procedure Laterality Date  . Cesarean section N/A 06/08/2013    Procedure: Primary CESAREAN SECTION of baby girl  at 2056 APGAR 8/9;  Surgeon: Tereso Newcomer, MD;  Location: [facility];  Service: Obstetrics;  Laterality: N/A;   Family History  Problem Relation Age of Onset  . Kidney disease Mother   . Heart disease Mother   . Hypertension Mother   . Cancer Father     metastatic prostate   . Hypertension Sister    Social History  Substance Use Topics  . Smoking status: Never Smoker   . Smokeless tobacco: Never Used  . Alcohol Use: No   OB History    Gravida Para Term Preterm AB TAB SAB Ectopic Multiple Living   1 1 1       1      Review of Systems   Constitutional: Negative for fever.  Musculoskeletal: Positive for joint swelling and arthralgias. Negative for myalgias.  Neurological: Negative for weakness and numbness.      Allergies  Penicillins  Home Medications   Prior to Admission medications   Medication Sig Start Date End Date Taking? Authorizing Provider  acetaminophen (TYLENOL) 500 MG tablet Take 500 mg by mouth every 6 (six) hours as needed. Reported on 12/09/2015   Yes Historical Provider, MD  etonogestrel (NEXPLANON) 68 MG IMPL implant Inject 1 each into the skin once.   Yes Historical Provider, MD  omeprazole (PRILOSEC) 20 MG capsule TAKE ONE CAPSULE BY MOUTH TWICE DAILY BEFORE MEAL(S) 10/19/15  Yes Cheral Marker, CNM  Linaclotide (LINZESS) 145 MCG CAPS capsule 1 PO 30 mins prior to your first meal. Patient not taking: Reported on 03/10/2016 12/09/15   West Bali, MD  naproxen (NAPROSYN) 500 MG tablet Take 1 tablet (500 mg total) by mouth 2 (two) times daily. 03/10/16   Burgess Amor, PA-C  nortriptyline (PAMELOR) 10 MG capsule 1 PO QHS FOR 7 DAYS THEN 2 PO QHS Patient not taking: Reported on 03/10/2016 12/09/15   [place] Bali, MD   BP 122/56 mmHg  Pulse 83  Temp(Src) 98.1 F (36.7 C) (Oral)  Resp 16  Ht 5\' 6"  (1.676 m)  Wt 86.183 kg  BMI 30.68  kg/m2  SpO2 100% Physical Exam  Constitutional: She appears well-developed and well-nourished.  HENT:  Head: Atraumatic.  Neck: Normal range of motion.  Cardiovascular:  Pulses equal bilaterally  Musculoskeletal: She exhibits tenderness.       Right knee: She exhibits swelling. She exhibits no effusion, no ecchymosis, no deformity, no erythema, no LCL laxity and no MCL laxity. Tenderness found. Patellar tendon tenderness noted.  Patellar tendon is intact without disruption.  Patient can straight leg raise without knee collapse.  Neurological: She is alert. She has normal strength. She displays normal reflexes. No sensory deficit.  Skin: Skin is warm and dry.   Psychiatric: She has a normal mood and affect.    ED Course  Procedures (including critical care time) Labs Review Labs Reviewed - No data to display  Imaging Review Dg Knee Complete 4 Views Right  03/10/2016  CLINICAL DATA:  Right knee pain, fall yesterday EXAM: RIGHT KNEE - COMPLETE 4+ VIEW COMPARISON:  None. FINDINGS: Three views of the right knee submitted. No acute fracture or subluxation. No radiopaque foreign body. No joint effusion. IMPRESSION: Negative. Electronically Signed   By: Natasha Mead M.D.   On: 03/10/2016 11:26   I have personally reviewed and evaluated these images and lab results as part of my medical decision-making.   EKG Interpretation None      MDM   Final diagnoses:  Patellar dislocation, right, initial encounter      Radiological studies were viewed, interpreted and considered during the medical decision making and disposition process. I agree with radiologists reading.  Results were also discussed with patient.  RICE, knee immobilizer. Referral to ortho for f/u care. naproxen     Burgess Amor, PA-C 03/10/16 1803  Donnetta Hutching, MD 03/11/16 [phone]

## 2016-03-11 ENCOUNTER — Telehealth: Payer: Self-pay | Admitting: Orthopedic Surgery

## 2016-03-11 NOTE — Telephone Encounter (Signed)
Patient called 03/10/16 regarding [name] emergency room visit for work-related injury 03/10/16, patella dislocation, which she states occurred at work.  Discussed workers comp protocol, which is required to be followed. Awaiting further response from patient's employer or workers comp insurer, if our clinic is an approved provider on their provider panel list.  Patient aware of status, and is speaking with employer. Her ph# is [phone].

## 2016-03-16 NOTE — Telephone Encounter (Signed)
No further response. 

## 2016-06-28 ENCOUNTER — Encounter: Payer: Self-pay | Admitting: Adult Health

## 2016-06-28 ENCOUNTER — Other Ambulatory Visit (HOSPITAL_COMMUNITY)
Admission: RE | Admit: 2016-06-28 | Discharge: 2016-06-28 | Disposition: A | Discharge status: Home / Self Care | Payer: 59 | Source: Ambulatory Visit | Attending: Adult Health | Admitting: Adult Health

## 2016-06-28 ENCOUNTER — Ambulatory Visit (INDEPENDENT_AMBULATORY_CARE_PROVIDER_SITE_OTHER): Payer: 59 | Admitting: Adult Health

## 2016-06-28 VITALS — BP 108/60 | HR 86 | Ht 65.0 in | Wt 194.0 lb

## 2016-06-28 DIAGNOSIS — Z1151 Encounter for screening for human papillomavirus (HPV): Secondary | ICD-10-CM | POA: Diagnosis present

## 2016-06-28 DIAGNOSIS — Z113 Encounter for screening for infections with a predominantly sexual mode of transmission: Secondary | ICD-10-CM | POA: Diagnosis present

## 2016-06-28 DIAGNOSIS — Z01419 Encounter for gynecological examination (general) (routine) without abnormal findings: Secondary | ICD-10-CM

## 2016-06-28 DIAGNOSIS — Z8742 Personal history of other diseases of the female genital tract: Secondary | ICD-10-CM

## 2016-06-28 DIAGNOSIS — Z975 Presence of (intrauterine) contraceptive device: Secondary | ICD-10-CM

## 2016-06-28 NOTE — Addendum Note (Signed)
Addended by: Colen Darling on: 06/28/2016 09:59 AM   Modules accepted: Orders

## 2016-06-28 NOTE — Progress Notes (Signed)
Patient ID: Mary Moses, female   DOB: 02-01-1986, 30 y.o.   MRN: [id number]  History of Present Illness: Mary Moses is a 31 old black female, single in for a well woman gyn exam and pap.She had LGSIL +HPV on pap 07/02/14. She has a nexplanon in her left arm that is past due for removal, she has appt 8/25 to have removed and reinserted. She has lost 10 lbs recently on phentermine.  Current Medications, Allergies, Past Medical History, Past Surgical History, Family History and Social History were reviewed in [employer] record.     Review of Systems: Patient denies any headaches, hearing loss, fatigue, blurred vision, shortness of breath, chest pain, abdominal pain, problems with bowel movements, urination, or intercourse. No joint pain or mood swings.    Physical Exam:BP 108/60 (BP Location: Left Arm, Patient Position: Sitting, Cuff Size: Normal)   Pulse 86   Ht 5\' 5"  (1.651 m)   Wt 194 lb (88 kg)   BMI 32.28 kg/m  General:  Well developed, well nourished, no acute distress Skin:  Warm and dry Neck:  Midline trachea, normal thyroid, good ROM, no lymphadenopathy Lungs; Clear to auscultation bilaterally Breast:  No dominant palpable mass, retraction, or nipple discharge Cardiovascular: Regular rate and rhythm Abdomen:  Soft, non tender, no hepatosplenomegaly Pelvic:  External genitalia is normal in appearance, no lesions.  The vagina is normal in appearance. Urethra has no lesions or masses. The cervix is bulbous. Pap with HPV and GC/CHL performed. Uterus is felt to be normal size, shape, and contour.  No adnexal masses or tenderness noted.Bladder is non tender, no masses felt. Extremities/musculoskeletal:  No swelling or varicosities noted, no clubbing or cyanosis,nexplanon palpated easily Psych:  No mood changes, alert and cooperative,seems happy   Impression:  Well woman gyn exam and pap Nexplanon in place Hx of abnormal pap   Plan: Physical in 1 year,  pap in 3 if normal Mammogram yearly Return 8/25 for nexplanon removal and reinsertion Use condoms

## 2016-06-28 NOTE — Patient Instructions (Addendum)
Physical in 1 year, pap in 3 if normal Mammogram at 38 Return next week for nexplanon removal and reinsertion Use condoms

## 2016-07-01 LAB — CYTOLOGY - PAP

## 2016-07-08 ENCOUNTER — Ambulatory Visit (INDEPENDENT_AMBULATORY_CARE_PROVIDER_SITE_OTHER): Payer: 59 | Admitting: Adult Health

## 2016-07-08 ENCOUNTER — Encounter: Payer: Self-pay | Admitting: Adult Health

## 2016-07-08 VITALS — BP 118/62 | HR 96 | Ht 65.0 in | Wt 189.2 lb

## 2016-07-08 DIAGNOSIS — Z3046 Encounter for surveillance of implantable subdermal contraceptive: Secondary | ICD-10-CM

## 2016-07-08 DIAGNOSIS — Z30017 Encounter for initial prescription of implantable subdermal contraceptive: Secondary | ICD-10-CM

## 2016-07-08 DIAGNOSIS — Z3049 Encounter for surveillance of other contraceptives: Secondary | ICD-10-CM

## 2016-07-08 DIAGNOSIS — Z3202 Encounter for pregnancy test, result negative: Secondary | ICD-10-CM

## 2016-07-08 LAB — POCT URINE PREGNANCY: Preg Test, Ur: NEGATIVE

## 2016-07-08 NOTE — Progress Notes (Signed)
Subjective:     Patient ID: Mary Moses, female   DOB: 11/21/1985, 30 y.o.   MRN: [id number]  HPI Mary Moses is s 30 year old black female in for nexplanon removal and reinsertion, no complaints.   Review of Systems For nexplanon removal and reinsertion Patient denies any headaches, hearing loss, fatigue, blurred vision, shortness of breath, chest pain, abdominal pain, problems with bowel movements, urination, or intercourse. No joint pain or mood swings. Reviewed past medical,surgical, social and family history. Reviewed medications and allergies.     Objective:   Physical Exam BP 118/62 (BP Location: Left Arm, Patient Position: Sitting, Cuff Size: Normal)   Pulse 96   Ht 5\' 5"  (1.651 m)   Wt 189 lb 3.2 oz (85.8 kg)   BMI 31.48 kg/m  UPT negative,  Consent signed, time out called, left arm cleansed with betadine, and injected with 1.5 cc 1% lidocaine and waited til numb.Under sterile technique a #11 blade was used to make small vertical incision, and a curved forceps was used to easily remove rod.Nexplanon easily inserted easily palpated by provider and pt. Pressure dressing applied.    Assessment:      Encounter for Nexplanon removal  Nexplanon insertion  Pregnancy examination or test, negative result - Plan: POCT urine pregnancy     Plan:     Use condoms x 2 weeks, keep clean and dry x 24 hours, no heavy lifting, keep steri strips on x 72 hours, Keep pressure dressing on x 24 hours. Follow up prn problems. Remove in 3 years

## 2016-07-08 NOTE — Patient Instructions (Signed)
Use condoms x 2 weeks, keep clean and dry x 24 hours, no heavy lifting, keep steri strips on x 72 hours, Keep pressure dressing on x 24 hours. Follow up prn problems. Return in 3 years for removal

## 2017-01-26 ENCOUNTER — Other Ambulatory Visit: Payer: Self-pay | Admitting: Gastroenterology

## 2017-05-01 ENCOUNTER — Emergency Department (HOSPITAL_COMMUNITY)
Admission: EM | Admit: 2017-05-01 | Discharge: 2017-05-01 | Disposition: A | Discharge status: Home / Self Care | Payer: 59 | Attending: Emergency Medicine | Admitting: Emergency Medicine

## 2017-05-01 ENCOUNTER — Encounter (HOSPITAL_COMMUNITY): Payer: Self-pay

## 2017-05-01 DIAGNOSIS — R109 Unspecified abdominal pain: Secondary | ICD-10-CM | POA: Diagnosis present

## 2017-05-01 DIAGNOSIS — E876 Hypokalemia: Secondary | ICD-10-CM | POA: Diagnosis not present

## 2017-05-01 DIAGNOSIS — R112 Nausea with vomiting, unspecified: Secondary | ICD-10-CM

## 2017-05-01 DIAGNOSIS — R197 Diarrhea, unspecified: Secondary | ICD-10-CM

## 2017-05-01 LAB — COMPREHENSIVE METABOLIC PANEL
ALT: 37 U/L (ref 14–54)
AST: 26 U/L (ref 15–41)
Albumin: 3.7 g/dL (ref 3.5–5.0)
Alkaline Phosphatase: 39 U/L (ref 38–126)
Anion gap: 8 (ref 5–15)
BUN: 9 mg/dL (ref 6–20)
CALCIUM: 8.5 mg/dL — AB (ref 8.9–10.3)
CHLORIDE: 101 mmol/L (ref 101–111)
CO2: 29 mmol/L (ref 22–32)
CREATININE: 0.88 mg/dL (ref 0.44–1.00)
GFR calc non Af Amer: >60 mL/min (ref >60)
Glucose, Bld: 93 mg/dL (ref 65–99)
Potassium: 2.9 mmol/L — ABNORMAL LOW (ref 3.5–5.1)
Sodium: 138 mmol/L (ref 135–145)
Total Bilirubin: 0.7 mg/dL (ref 0.3–1.2)
Total Protein: 7.6 g/dL (ref 6.5–8.1)

## 2017-05-01 LAB — CBC WITH DIFFERENTIAL/PLATELET
Basophils Absolute: 0 10*3/uL (ref 0.0–0.1)
Basophils Relative: 0 %
EOS PCT: 0 %
Eosinophils Absolute: 0 10*3/uL (ref 0.0–0.7)
HCT: 43.1 % (ref 36.0–46.0)
Hemoglobin: 14.3 g/dL (ref 12.0–15.0)
LYMPHS PCT: 20 %
Lymphs Abs: 1.4 10*3/uL (ref 0.7–4.0)
MCH: 28.7 pg (ref 26.0–34.0)
MCHC: 33.2 g/dL (ref 30.0–36.0)
MCV: 86.4 fL (ref 78.0–100.0)
MONO ABS: 1.1 10*3/uL — AB (ref 0.1–1.0)
Monocytes Relative: 15 %
Neutro Abs: 4.7 10*3/uL (ref 1.7–7.7)
Neutrophils Relative %: 65 %
PLATELETS: 162 10*3/uL (ref 150–400)
RBC: 4.99 MIL/uL (ref 3.87–5.11)
RDW: 13.4 % (ref 11.5–15.5)
WBC: 7.4 10*3/uL (ref 4.0–10.5)

## 2017-05-01 LAB — URINALYSIS, ROUTINE W REFLEX MICROSCOPIC
Glucose, UA: NEGATIVE mg/dL
KETONES UR: NEGATIVE mg/dL
Leukocytes, UA: NEGATIVE
NITRITE: NEGATIVE
Protein, ur: 100 mg/dL — AB
Specific Gravity, Urine: 1.025 (ref 1.005–1.030)
pH: 6 (ref 5.0–8.0)

## 2017-05-01 LAB — URINALYSIS, MICROSCOPIC (REFLEX): WBC, UA: NONE SEEN WBC/hpf (ref 0–5)

## 2017-05-01 LAB — PREGNANCY, URINE: PREG TEST UR: NEGATIVE

## 2017-05-01 MED ORDER — PANTOPRAZOLE SODIUM 40 MG IV SOLR
40.0000 mg | Freq: Once | INTRAVENOUS | Status: AC
  Administered 2017-05-01: 40 mg via INTRAVENOUS
  Filled 2017-05-01: qty 40

## 2017-05-01 MED ORDER — PANTOPRAZOLE SODIUM 20 MG PO TBEC: 20.0000 mg | DELAYED_RELEASE_TABLET | Freq: Every day | ORAL | 0 refills | Status: DC

## 2017-05-01 MED ORDER — POTASSIUM CHLORIDE 10 MEQ/100ML IV SOLN
10.0000 meq | Freq: Once | INTRAVENOUS | Status: AC
  Administered 2017-05-01: 10 meq via INTRAVENOUS
  Filled 2017-05-01: qty 100

## 2017-05-01 MED ORDER — SODIUM CHLORIDE 0.9 % IV BOLUS (SEPSIS)
1000.0000 mL | Freq: Once | INTRAVENOUS | Status: AC
  Administered 2017-05-01: 1000 mL via INTRAVENOUS

## 2017-05-01 MED ORDER — POTASSIUM CHLORIDE CRYS ER 20 MEQ PO TBCR
40.0000 meq | EXTENDED_RELEASE_TABLET | Freq: Once | ORAL | Status: AC
  Administered 2017-05-01: 40 meq via ORAL
  Filled 2017-05-01: qty 2

## 2017-05-01 MED ORDER — ACETAMINOPHEN 500 MG PO TABS
1000.0000 mg | ORAL_TABLET | Freq: Once | ORAL | Status: AC
  Administered 2017-05-01: 1000 mg via ORAL
  Filled 2017-05-01: qty 2

## 2017-05-01 MED ORDER — ONDANSETRON HCL 4 MG/2ML IJ SOLN
4.0000 mg | Freq: Once | INTRAMUSCULAR | Status: AC
  Administered 2017-05-01: 4 mg via INTRAVENOUS
  Filled 2017-05-01: qty 2

## 2017-05-01 MED ORDER — FAMOTIDINE 20 MG PO TABS
20.0000 mg | ORAL_TABLET | Freq: Once | ORAL | Status: AC
  Administered 2017-05-01: 20 mg via ORAL
  Filled 2017-05-01: qty 1

## 2017-05-01 MED ORDER — SODIUM CHLORIDE 0.9 % IV SOLN
1000.0000 mL | INTRAVENOUS | Status: DC
  Administered 2017-05-01: 1000 mL via INTRAVENOUS

## 2017-05-01 MED ORDER — ONDANSETRON HCL 4 MG PO TABS: 4.0000 mg | ORAL_TABLET | Freq: Four times a day (QID) | ORAL | 0 refills | Status: DC

## 2017-05-01 MED ORDER — RANITIDINE HCL 150 MG PO TABS: ORAL_TABLET | ORAL | 0 refills | Status: DC

## 2017-05-01 MED ORDER — POTASSIUM CHLORIDE ER 10 MEQ PO TBCR
10.0000 meq | EXTENDED_RELEASE_TABLET | Freq: Every day | ORAL | 0 refills | Status: DC
Start: 2017-05-01 — End: 2017-06-28

## 2017-05-01 NOTE — ED Notes (Signed)
PA at the bedside, pt eating crackers and drinking ginger ale

## 2017-05-01 NOTE — ED Notes (Signed)
Patient given discharge instruction, verbalized understand. IV removed, band aid applied. Patient ambulatory out of the department.  

## 2017-05-01 NOTE — ED Notes (Signed)
Pt ambulatory to the bathroom, denies diarrhea, complaining of headache and wanting food, will advise PA

## 2017-05-01 NOTE — ED Notes (Signed)
ED Provider at bedside. 

## 2017-05-01 NOTE — ED Triage Notes (Signed)
Pt reports abd pain, n/v/d since Friday.

## 2017-05-01 NOTE — Discharge Instructions (Signed)
Your vital signs are within normal limits. You were given IV fluids and antinausea medicine today. Please use Zofran every 6 hours if needed for nausea. Use Protonix and Zantac for the acid reflux issue. Your potassium was low. Please use 10 mEq of potassium daily. Please select foods high in potassium until you are seen by a primary care physician. It is important that she see your primary physician sone so that this can be rechecked. If you do not have a primary physician, please call Dr. Delton See to see if you can be established with her as a primary care.

## 2017-05-01 NOTE — ED Provider Notes (Signed)
[facility] Provider Note   CSN: [id number] Arrival date & time: 05/01/17  0740     History   Chief Complaint Chief Complaint  Patient presents with  . Abdominal Pain    HPI Mary Moses is a 31 y.o. female.  Patient is a 32 year old female who presents to the emergency department with complaint of abdominal pain.  Patient has a history of abdominal bloating and GERD.  Patient gives a three-day history of abdominal pain, accompanied by nausea, vomiting, and diarrhea. No high fever reported. 2 days ago the patient noted on chills and diarrhea, but no vomiting. Today the patient noted diarrhea. The patient also states that her stools seem to have a different smell than usual. The pain is an aching crampy kind of pain. The patient denies any blood in her stool. His been no blood in the urine. She's had no recent operations or procedures involving her abdomen. 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 She presents now for assistance with this issue.      Past Medical History:  Diagnosis Date  . Abdominal bloating 09/16/2015  . Abnormal Pap smear   . Cramps, extremity 09/16/2015  . Drug induced amenorrhea 09/16/2015   Has nexplanon  . GERD (gastroesophageal reflux disease)   . Nexplanon in place 09/16/2015  . Nexplanon insertion 07/09/2013   nexplanon inserted 8/26 left arm remove 07/09/16  . Obesity   . Pregnancy   . Swelling 06/19/2013    Patient Active Problem List   Diagnosis Date Noted  . Cramps, extremity 09/16/2015  . Abdominal bloating 09/16/2015  . Nexplanon in place 09/16/2015  . Drug induced amenorrhea 09/16/2015  . 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 Nexplanon insertion 07/09/2013  . Abnormal Pap smear of cervix 04/01/2013    Past Surgical History:  Procedure Laterality Date  . CESAREAN SECTION N/A 06/08/2013   Procedure: Primary CESAREAN SECTION of baby girl  at 2056  APGAR 8/9;  Surgeon: Tereso Newcomer, MD;  Location: [facility];  Service: Obstetrics;  Laterality: N/A;    OB History    Gravida Para Term Preterm AB Living   1 1 1     1    SAB TAB Ectopic Multiple Live Births           1       Home Medications    Prior to Admission medications   Medication Sig Start Date End Date Taking? Authorizing Provider  acetaminophen (TYLENOL) 500 MG tablet Take 500 mg by mouth every 6 (six) hours as needed. Reported on 12/09/2015    [provider]  etonogestrel (NEXPLANON) 68 MG IMPL implant Inject 1 each into the skin once.    [provider]  LINZESS 145 MCG CAPS capsule TAKE ONE CAPSULE BY MOUTH 30 MINUTES PRIOR TO YOUR FIRST MEAL 01/27/17   Gelene Mink, NP  phentermine 15 MG capsule Take 15 mg by mouth every morning.    [provider]  ranitidine (ZANTAC) 300 MG tablet Take 300 mg by mouth at bedtime.    [provider]    Family History Family History  Problem Relation Age of Onset  . Kidney disease Mother   . Heart disease Mother   . Hypertension Mother   . Cancer Father        metastatic prostate   . Hypertension Sister     Social History Social History  Substance Use Topics  . Smoking status: Never Smoker  . Smokeless tobacco: Never Used  . Alcohol use No     Allergies   Penicillins   Review of Systems Review of Systems  Constitutional: Positive for appetite change, chills and fatigue. Negative for activity change and fever.  HENT: Negative for congestion, ear discharge, ear pain, facial swelling, nosebleeds, rhinorrhea, sneezing and tinnitus.   Eyes: Negative for photophobia, pain and discharge.  Respiratory: Negative for cough, choking, shortness of breath and wheezing.   Cardiovascular: Negative for chest pain, palpitations and leg swelling.  Gastrointestinal: Positive for abdominal pain, diarrhea, nausea and vomiting. Negative for blood in stool and constipation.  Genitourinary: Negative  for difficulty urinating, dysuria, flank pain, frequency and hematuria.  Musculoskeletal: Negative for back pain, gait problem, myalgias and neck pain.  Skin: Negative for color change, rash and wound.  Neurological: Negative for dizziness, seizures, syncope, facial asymmetry, speech difficulty, weakness and numbness.  Hematological: Negative for adenopathy. Does not bruise/bleed easily.  Psychiatric/Behavioral: Negative for agitation, confusion, hallucinations, self-injury and suicidal ideas. The patient is not nervous/anxious.      Physical Exam Updated Vital Signs BP 118/75 (BP Location: Right Arm)   Pulse 93   Temp 98.4 F (36.9 C) (Oral)   Resp 18   Ht 5\' 6"  (1.676 m)   Wt 78.9 kg (174 lb)   SpO2 100%   BMI 28.08 kg/m   Physical Exam  Constitutional: Vital signs are normal. She appears well-developed and well-nourished. She is active.  HENT:  Head: Normocephalic and atraumatic.  Right Ear: Tympanic membrane, external ear and ear canal normal.  Left Ear: Tympanic membrane, external ear and ear canal normal.  Nose: Nose normal.  Mouth/Throat: Uvula is midline, oropharynx is clear and moist and mucous membranes are normal.  Eyes: Conjunctivae, EOM and lids are normal. Pupils are equal, round, and reactive to light.  Neck: Trachea normal, normal range of motion and phonation normal. Neck supple. Carotid bruit is not present.  Cardiovascular: Normal rate, regular rhythm and normal pulses.   Abdominal: Soft. Normal appearance and bowel sounds are normal.  Lymphadenopathy:       Head (right side): No submental, no preauricular and no posterior auricular adenopathy present.       Head (left side): No submental, no preauricular and no posterior auricular adenopathy present.    She has no cervical adenopathy.  Neurological: She is alert. She has normal strength. No cranial nerve deficit or sensory deficit. GCS eye subscore is 4. GCS verbal subscore is 5. GCS motor subscore is 6.    Skin: Skin is warm and dry.  Psychiatric: Her speech is normal.  Nursing note and vitals reviewed.    ED Treatments / Results  Labs (all labs ordered are listed, but only abnormal results are displayed) Labs Reviewed  CBC WITH DIFFERENTIAL/PLATELET  COMPREHENSIVE METABOLIC PANEL  URINALYSIS, ROUTINE W REFLEX MICROSCOPIC  PREGNANCY, URINE    EKG  EKG Interpretation None       Radiology No results found.  Procedures Procedures (including critical care time)  Medications Ordered in ED Medications - No data to display   Initial Impression / Assessment and Plan / ED Course  I have reviewed the triage vital signs and the nursing notes.  Pertinent labs & imaging results that were available during my care of the patient were reviewed by me and considered in my medical decision making (see chart for details).       Final Clinical Impressions(s) /  ED Diagnoses MDM Vital signs within normal limits. Patient appears weak, but in no distress. IV fluids will be started. Patient will be given IV Zofran. Will check stool for C. difficile if possible. We'll also check electrolytes.  There is a documentation in the nursing notes of the patient's pulse oximetry dropping to 85%, this was placed on the wrong chart and does not apply to this patient.  Complete blood count was well within normal limits. The potassium is noted to be low at 2.9. Oral potassium of 40 mEq given to the patient. Patient will also be given 10 mEq in the IV. The patient states that she been having some cramping in her extremities, but no other symptoms reported.   Urine pregnancy test is negative.   No further complaint of nausea or vomiting after the IV Zofran.  Patient tolerated IV fluids without problem.  Patient tolerated oral and IV potassium without problem. At discharge patient is ambulatory without problem. Patient was given a tray and was able to eat without nausea or vomiting reported. I've asked  the patient to see her primary physician concerning her potassium as well as the medications for GERD. Patient was given a few days of medication to use until she can see the primary physician. Patient is also given a list of foods high in potassium to use until she is seen by the primary physician. Patient is in agreement with this plan.    Final diagnoses:  Nausea vomiting and diarrhea  Hypokalemia    New Prescriptions New Prescriptions   No medications on file     Ivery Quale, Cordelia Poche 05/01/17 1426    Donnetta Hutching, MD 05/03/17 [phone]

## 2017-05-01 NOTE — ED Notes (Addendum)
Wrong chart

## 2017-05-01 NOTE — ED Notes (Signed)
Pt eating lunch tray, ordered per PA

## 2017-05-02 ENCOUNTER — Telehealth: Payer: Self-pay | Admitting: Adult Health

## 2017-05-02 NOTE — Telephone Encounter (Signed)
Spoke with pt. Pt has a potassium supplement from the hospital. She has enough to last until July 1. JAG advised if she has supplement, continue taking those and can schedule an appt for next week. Pt voiced understanding and call was transferred to front desk. JSY

## 2017-05-10 ENCOUNTER — Ambulatory Visit (INDEPENDENT_AMBULATORY_CARE_PROVIDER_SITE_OTHER): Payer: 59 | Admitting: Adult Health

## 2017-05-10 ENCOUNTER — Encounter: Payer: Self-pay | Admitting: Adult Health

## 2017-05-10 VITALS — BP 90/52 | HR 78 | Ht 65.0 in | Wt 173.4 lb

## 2017-05-10 DIAGNOSIS — K219 Gastro-esophageal reflux disease without esophagitis: Secondary | ICD-10-CM | POA: Insufficient documentation

## 2017-05-10 DIAGNOSIS — Z8639 Personal history of other endocrine, nutritional and metabolic disease: Secondary | ICD-10-CM | POA: Insufficient documentation

## 2017-05-10 MED ORDER — PANTOPRAZOLE SODIUM 20 MG PO TBEC: 20.0000 mg | DELAYED_RELEASE_TABLET | Freq: Every day | ORAL | 6 refills | Status: DC

## 2017-05-10 NOTE — Progress Notes (Signed)
Subjective:     Patient ID: Mary Moses, female   DOB: [birth year], 31 y.o.   MRN: [id number]  HPI Mary Moses is a 31 year old black female, in follow up in being seen in ER at [facility] 05/01/17 for nausea, vomiting and diarrhea and found to have low potassium was 2.9, and was given IV K+ and 10 meq K Dur 1 daily for 12 days.  Review of Systems Weak at times Has reflux Reviewed past medical,surgical, social and family history. Reviewed medications and allergies.     Objective:   Physical Exam BP (!) 90/52 (BP Location: Right Arm, Patient Position: Sitting, Cuff Size: Normal)   Pulse 78   Ht 5\' 5"  (1.651 m)   Wt 173 lb 6.4 oz (78.7 kg)   BMI 28.86 kg/m  Skin warm and dry. Neck: mid line trachea, normal thyroid, good ROM, no lymphadenopathy noted. Lungs: clear to ausculation bilaterally. Cardiovascular: regular rate and rhythm.    Assessment:     1. History of hypokalemia   2. Gastroesophageal reflux disease, esophagitis presence not specified       Plan:    Check CMP Refilled protonix 20 mg #30 take 1 daily with 6 refills Follow up prn

## 2017-05-11 LAB — COMPREHENSIVE METABOLIC PANEL
ALBUMIN: 4.5 g/dL (ref 3.5–5.5)
ALT: 29 IU/L (ref 0–32)
AST: 21 IU/L (ref 0–40)
Albumin/Globulin Ratio: 1.3 (ref 1.2–2.2)
Alkaline Phosphatase: 49 IU/L (ref 39–117)
BUN / CREAT RATIO: 13 (ref 9–23)
BUN: 11 mg/dL (ref 6–20)
Bilirubin Total: <0.2 mg/dL (ref 0.0–1.2)
CALCIUM: 9.5 mg/dL (ref 8.7–10.2)
CO2: 21 mmol/L (ref 20–29)
CREATININE: 0.88 mg/dL (ref 0.57–1.00)
Chloride: 101 mmol/L (ref 96–106)
GFR, EST AFRICAN AMERICAN: 100 mL/min/{1.73_m2} (ref >59)
GFR, EST NON AFRICAN AMERICAN: 87 mL/min/{1.73_m2} (ref >59)
GLUCOSE: 77 mg/dL (ref 65–99)
Globulin, Total: 3.4 g/dL (ref 1.5–4.5)
Potassium: 4.2 mmol/L (ref 3.5–5.2)
Sodium: 140 mmol/L (ref 134–144)
TOTAL PROTEIN: 7.9 g/dL (ref 6.0–8.5)

## 2017-05-15 ENCOUNTER — Telehealth: Payer: Self-pay | Admitting: Obstetrics & Gynecology

## 2017-05-15 NOTE — Telephone Encounter (Signed)
LMOVM that potassium was back to normal. Advised to call if further questions.

## 2017-05-30 ENCOUNTER — Telehealth: Payer: Self-pay | Admitting: Adult Health

## 2017-05-30 MED ORDER — PANTOPRAZOLE SODIUM 20 MG PO TBEC: 20.0000 mg | DELAYED_RELEASE_TABLET | Freq: Two times a day (BID) | ORAL | 4 refills | Status: DC

## 2017-05-30 NOTE — Telephone Encounter (Signed)
Patient states she is still having heartburn to the point of gagging and wanting to throw up. She doesn't know if dosage needs to be changed or if she could try taking it at night. Please advise.

## 2017-05-30 NOTE — Telephone Encounter (Signed)
Still having heartburn, increase Protonix to bid and let me know if any better

## 2017-05-30 NOTE — Telephone Encounter (Signed)
Patient called stating that Mary Moses has placed her on a medication that gives her sever heartburn and she would like to know if she could take her medication at night. Please contact pt

## 2017-06-12 ENCOUNTER — Telehealth: Payer: Self-pay | Admitting: Adult Health

## 2017-06-12 NOTE — Telephone Encounter (Signed)
patient called stating that she was told by Victorino Dike that she could try out a new prescription regarding her heartburn. Patient states that they way Victorino Dike prescribed it worked great and would like more. Please contact pt

## 2017-06-12 NOTE — Telephone Encounter (Signed)
Left message should have refills at drug store on protonix and glad it was working well

## 2017-06-28 ENCOUNTER — Emergency Department (HOSPITAL_COMMUNITY): Payer: 59

## 2017-06-28 ENCOUNTER — Emergency Department (HOSPITAL_COMMUNITY)
Admission: EM | Admit: 2017-06-28 | Discharge: 2017-06-28 | Disposition: A | Discharge status: Home / Self Care | Payer: 59 | Attending: Emergency Medicine | Admitting: Emergency Medicine

## 2017-06-28 ENCOUNTER — Encounter (HOSPITAL_COMMUNITY): Payer: Self-pay | Admitting: Cardiology

## 2017-06-28 DIAGNOSIS — Y92410 Unspecified street and highway as the place of occurrence of the external cause: Secondary | ICD-10-CM | POA: Insufficient documentation

## 2017-06-28 DIAGNOSIS — Z79899 Other long term (current) drug therapy: Secondary | ICD-10-CM | POA: Insufficient documentation

## 2017-06-28 DIAGNOSIS — S161XXA Strain of muscle, fascia and tendon at neck level, initial encounter: Secondary | ICD-10-CM

## 2017-06-28 DIAGNOSIS — S0990XA Unspecified injury of head, initial encounter: Secondary | ICD-10-CM | POA: Diagnosis present

## 2017-06-28 DIAGNOSIS — S20211A Contusion of right front wall of thorax, initial encounter: Secondary | ICD-10-CM | POA: Diagnosis not present

## 2017-06-28 DIAGNOSIS — Y9389 Activity, other specified: Secondary | ICD-10-CM | POA: Diagnosis not present

## 2017-06-28 DIAGNOSIS — S8001XA Contusion of right knee, initial encounter: Secondary | ICD-10-CM

## 2017-06-28 DIAGNOSIS — S46911A Strain of unspecified muscle, fascia and tendon at shoulder and upper arm level, right arm, initial encounter: Secondary | ICD-10-CM | POA: Diagnosis not present

## 2017-06-28 DIAGNOSIS — Y999 Unspecified external cause status: Secondary | ICD-10-CM | POA: Insufficient documentation

## 2017-06-28 LAB — I-STAT BETA HCG BLOOD, ED (MC, WL, AP ONLY): I-stat hCG, quantitative: <5 m[IU]/mL (ref <5)

## 2017-06-28 MED ORDER — IBUPROFEN 800 MG PO TABS
800.0000 mg | ORAL_TABLET | Freq: Once | ORAL | Status: AC
Start: 2017-06-28 — End: 2017-06-28
  Administered 2017-06-28: 800 mg via ORAL
  Filled 2017-06-28: qty 1

## 2017-06-28 MED ORDER — CYCLOBENZAPRINE HCL 10 MG PO TABS: 10.0000 mg | ORAL_TABLET | Freq: Two times a day (BID) | ORAL | 0 refills | Status: DC | PRN

## 2017-06-28 MED ORDER — HYDROCODONE-ACETAMINOPHEN 5-325 MG PO TABS
1.0000 | ORAL_TABLET | Freq: Once | ORAL | Status: AC
  Administered 2017-06-28: 1 via ORAL
  Filled 2017-06-28: qty 1

## 2017-06-28 MED ORDER — IBUPROFEN 600 MG PO TABS: 600.0000 mg | ORAL_TABLET | Freq: Four times a day (QID) | ORAL | 0 refills | Status: DC | PRN

## 2017-06-28 MED ORDER — HYDROCODONE-ACETAMINOPHEN 5-325 MG PO TABS: 1.0000 | ORAL_TABLET | ORAL | 0 refills | Status: DC | PRN
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
## 2017-08-18 ENCOUNTER — Telehealth: Payer: Self-pay | Admitting: Adult Health

## 2017-08-18 NOTE — Telephone Encounter (Signed)
Patient called stating that she would like Victorino Dike to take over a medication that another doctor has prescribed her. Pt was told and is aware that Victorino Dike is not in the office today and wont be in until Monday. PT states that this will be okay.

## 2017-08-21 MED ORDER — VITAMIN D (ERGOCALCIFEROL) 1.25 MG (50000 UNIT) PO CAPS: 50000.0000 [IU] | ORAL_CAPSULE | ORAL | 6 refills | Status: DC

## 2017-08-21 NOTE — Telephone Encounter (Signed)
She requests vitamin D refilled, done

## 2018-01-30 ENCOUNTER — Other Ambulatory Visit: Payer: Self-pay | Admitting: Adult Health

## 2018-03-09 ENCOUNTER — Other Ambulatory Visit: Payer: Self-pay | Admitting: Gastroenterology

## 2018-03-28 ENCOUNTER — Emergency Department (HOSPITAL_COMMUNITY): Payer: 59

## 2018-03-28 ENCOUNTER — Emergency Department (HOSPITAL_COMMUNITY)
Admission: EM | Admit: 2018-03-28 | Discharge: 2018-03-28 | Disposition: A | Discharge status: Home / Self Care | Payer: 59 | Attending: Emergency Medicine | Admitting: Emergency Medicine

## 2018-03-28 ENCOUNTER — Other Ambulatory Visit: Payer: Self-pay

## 2018-03-28 ENCOUNTER — Encounter (HOSPITAL_COMMUNITY): Payer: Self-pay | Admitting: Emergency Medicine

## 2018-03-28 DIAGNOSIS — Z79899 Other long term (current) drug therapy: Secondary | ICD-10-CM | POA: Diagnosis not present

## 2018-03-28 DIAGNOSIS — R112 Nausea with vomiting, unspecified: Secondary | ICD-10-CM

## 2018-03-28 DIAGNOSIS — R197 Diarrhea, unspecified: Secondary | ICD-10-CM | POA: Diagnosis not present

## 2018-03-28 DIAGNOSIS — R1084 Generalized abdominal pain: Secondary | ICD-10-CM | POA: Insufficient documentation

## 2018-03-28 DIAGNOSIS — Z3202 Encounter for pregnancy test, result negative: Secondary | ICD-10-CM | POA: Insufficient documentation

## 2018-03-28 LAB — CBC WITH DIFFERENTIAL/PLATELET
BASOS PCT: 0 %
Basophils Absolute: 0 10*3/uL (ref 0.0–0.1)
EOS PCT: 1 %
Eosinophils Absolute: 0.1 10*3/uL (ref 0.0–0.7)
HCT: 42.4 % (ref 36.0–46.0)
HEMOGLOBIN: 13.8 g/dL (ref 12.0–15.0)
Lymphocytes Relative: 22 %
Lymphs Abs: 2 10*3/uL (ref 0.7–4.0)
MCH: 28.3 pg (ref 26.0–34.0)
MCHC: 32.5 g/dL (ref 30.0–36.0)
MCV: 87.1 fL (ref 78.0–100.0)
MONO ABS: 0.7 10*3/uL (ref 0.1–1.0)
MONOS PCT: 7 %
Neutro Abs: 6.2 10*3/uL (ref 1.7–7.7)
Neutrophils Relative %: 70 %
Platelets: 206 10*3/uL (ref 150–400)
RBC: 4.87 MIL/uL (ref 3.87–5.11)
RDW: 13.2 % (ref 11.5–15.5)
WBC: 8.9 10*3/uL (ref 4.0–10.5)

## 2018-03-28 LAB — COMPREHENSIVE METABOLIC PANEL
ALBUMIN: 4.1 g/dL (ref 3.5–5.0)
ALT: 17 U/L (ref 14–54)
AST: 19 U/L (ref 15–41)
Alkaline Phosphatase: 53 U/L (ref 38–126)
BILIRUBIN TOTAL: 0.7 mg/dL (ref 0.3–1.2)
BUN: 8 mg/dL (ref 6–20)
CHLORIDE: 106 mmol/L (ref 101–111)
CO2: 26 mmol/L (ref 22–32)
Calcium: 8.8 mg/dL — ABNORMAL LOW (ref 8.9–10.3)
Creatinine, Ser: 0.78 mg/dL (ref 0.44–1.00)
GFR calc Af Amer: >60 mL/min (ref >60)
Glucose, Bld: 100 mg/dL — ABNORMAL HIGH (ref 65–99)
POTASSIUM: 4 mmol/L (ref 3.5–5.1)
Sodium: 134 mmol/L — ABNORMAL LOW (ref 135–145)
Total Protein: 8.4 g/dL — ABNORMAL HIGH (ref 6.5–8.1)

## 2018-03-28 LAB — LIPASE, BLOOD: Lipase: 25 U/L (ref 11–51)

## 2018-03-28 LAB — URINALYSIS, ROUTINE W REFLEX MICROSCOPIC
BILIRUBIN URINE: NEGATIVE
Bacteria, UA: NONE SEEN
Glucose, UA: NEGATIVE mg/dL
KETONES UR: NEGATIVE mg/dL
Leukocytes, UA: NEGATIVE
Nitrite: NEGATIVE
PH: 5 (ref 5.0–8.0)
Protein, ur: NEGATIVE mg/dL
Specific Gravity, Urine: 1.015 (ref 1.005–1.030)

## 2018-03-28 LAB — PREGNANCY, URINE: PREG TEST UR: NEGATIVE

## 2018-03-28 MED ORDER — KETOROLAC TROMETHAMINE 30 MG/ML IJ SOLN
30.0000 mg | Freq: Once | INTRAMUSCULAR | Status: AC
  Administered 2018-03-28: 30 mg via INTRAVENOUS
  Filled 2018-03-28: qty 1

## 2018-03-28 MED ORDER — DICYCLOMINE HCL 20 MG PO TABS: ORAL_TABLET | ORAL | 0 refills | Status: DC

## 2018-03-28 MED ORDER — ONDANSETRON 4 MG PO TBDP: ORAL_TABLET | ORAL | 0 refills | Status: DC

## 2018-03-28 MED ORDER — SODIUM CHLORIDE 0.9 % IV BOLUS
1000.0000 mL | Freq: Once | INTRAVENOUS | Status: AC
  Administered 2018-03-28: 1000 mL via INTRAVENOUS

## 2018-03-28 MED ORDER — ONDANSETRON HCL 4 MG/2ML IJ SOLN
4.0000 mg | Freq: Once | INTRAMUSCULAR | Status: AC
  Administered 2018-03-28: 4 mg via INTRAVENOUS
  Filled 2018-03-28: qty 2

## 2018-03-28 NOTE — ED Triage Notes (Signed)
PT c/o middle abdominal discomfort with bloating,nausea, increased belching, and diarrhea intermittently x4 days. PT denies nay urinary symptoms or fever. PT had took imodium when the diarrhea first started then felt constipated so she took a Linzess yesterday evening then diarrhea started back this am.

## 2018-03-28 NOTE — Discharge Instructions (Signed)
Follow-up with Dr. Jena Gauss, the stomach specialist if not improving.  Drink plenty of fluids

## 2018-03-28 NOTE — ED Provider Notes (Signed)
[facility] EMERGENCY DEPARTMENT Provider Note   CSN: [id number] Arrival date & time: 03/28/18  4098     History   Chief Complaint Chief Complaint  Patient presents with  . Abdominal Pain    HPI Mary Moses is a 32 y.o. female.  Patient complains of some abdominal pain nausea belching diarrhea.  She took some Imodium and then became constipated still having abdominal cramping  The history is provided by the patient. No language interpreter was used.  Abdominal Pain   This is a new problem. The current episode started more than 2 days ago. The problem occurs constantly. The problem has not changed since onset.The pain is associated with an unknown factor. The pain is located in the generalized abdominal region. The quality of the pain is aching. The pain is at a severity of 4/10. The pain is moderate. Associated symptoms include diarrhea. Pertinent negatives include anorexia, frequency, hematuria and headaches. Nothing aggravates the symptoms. Nothing relieves the symptoms. Past workup does not include GI consult. Her past medical history does not include PUD or ulcerative colitis.    Past Medical History:  Diagnosis Date  . Abdominal bloating 09/16/2015  . Abnormal Pap smear   . Cramps, extremity 09/16/2015  . Drug induced amenorrhea 09/16/2015   Has nexplanon  . GERD (gastroesophageal reflux disease)   . Hypokalemia   . Nexplanon in place 09/16/2015  . Nexplanon insertion 07/09/2013   nexplanon inserted 8/26 left arm remove 07/09/16  . Obesity   . Pregnancy   . Swelling 06/19/2013  . Vaginal Pap smear, abnormal     Patient Active Problem List   Diagnosis Date Noted  . Gastroesophageal reflux disease 05/10/2017  . History of hypokalemia 05/10/2017  . Cramps, extremity 09/16/2015  . Abdominal bloating 09/16/2015  . Nexplanon in place 09/16/2015  . Drug induced amenorrhea 09/16/2015  . 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 Nexplanon insertion 07/09/2013  .  Abnormal Pap smear of cervix 04/01/2013    Past Surgical History:  Procedure Laterality Date  . CESAREAN SECTION N/A 06/08/2013   Procedure: Primary CESAREAN SECTION of baby girl  at 2056 APGAR 8/9;  Surgeon: Tereso Newcomer, MD;  Location: [facility];  Service: Obstetrics;  Laterality: N/A;     OB History    Gravida  1   Para  1   Term  1   Preterm      AB      Living  1     SAB      TAB      Ectopic      Multiple      Live Births  1            Home Medications    Prior to Admission medications   Medication Sig Start Date End Date Taking? Authorizing Provider  Biotin 10 MG CAPS Take by mouth.   Yes [provider]  cyclobenzaprine (FLEXERIL) 10 MG tablet Take 1 tablet (10 mg total) by mouth 2 (two) times daily as needed for muscle spasms. 06/28/17  Yes Jacalyn Lefevre, MD  etonogestrel (NEXPLANON) 68 MG IMPL implant Inject 1 each into the skin once.   Yes [provider]  ibuprofen (ADVIL,MOTRIN) 600 MG tablet Take 1 tablet (600 mg total) by mouth every 6 (six) hours as needed. 06/28/17  Yes Jacalyn Lefevre, MD  LINZESS 145 MCG CAPS capsule TAKE 1 CAPSULE BY MOUTH 30 MINUTES PRIOR TO YOUR FIRST MEAL. 03/14/18  Yes Anice Paganini,  NP  pantoprazole (PROTONIX) 20 MG tablet TAKE 1 TABLET BY MOUTH TWICE DAILY 01/31/18  Yes Adline Potter, NP  ranitidine (ZANTAC) 150 MG tablet 1 with each meal and at bedtime 05/01/17  Yes Ivery Quale, PA-C  vitamin B-12 (CYANOCOBALAMIN) 500 MCG tablet Take 500 mcg by mouth daily.   Yes [provider]  Vitamin D, Ergocalciferol, (DRISDOL) 50000 units CAPS capsule Take 1 capsule (50,000 Units total) by mouth every 7 (seven) days. Mondays 08/21/17  Yes Adline Potter, NP  dicyclomine (BENTYL) 20 MG tablet Take one every 8 hours if needed for abdominal cramps 03/28/18   Bethann Berkshire, MD  ondansetron (ZOFRAN ODT) 4 MG disintegrating tablet 4mg  ODT q4 hours prn nausea/vomit 03/28/18   Bethann Berkshire, MD     Family History Family History  Problem Relation Age of Onset  . Kidney disease Mother   . Heart disease Mother   . Hypertension Mother   . Cancer Father        metastatic prostate   . Hypertension Sister     Social History Social History   Tobacco Use  . Smoking status: Never Smoker  . Smokeless tobacco: Never Used  Substance Use Topics  . Alcohol use: No  . Drug use: No     Allergies   Penicillins   Review of Systems Review of Systems  Constitutional: Negative for appetite change and fatigue.  HENT: Negative for congestion, ear discharge and sinus pressure.   Eyes: Negative for discharge.  Respiratory: Negative for cough.   Cardiovascular: Negative for chest pain.  Gastrointestinal: Positive for abdominal pain and diarrhea. Negative for anorexia.  Genitourinary: Negative for frequency and hematuria.  Musculoskeletal: Negative for back pain.  Skin: Negative for rash.  Neurological: Negative for seizures and headaches.  Psychiatric/Behavioral: Negative for hallucinations.     Physical Exam Updated Vital Signs BP 104/68   Pulse 70   Temp 98.1 F (36.7 C) (Oral)   Resp 16   Ht 5\' 6"  (1.676 m)   Wt 81.6 kg (180 lb)   SpO2 100%   BMI 29.05 kg/m   Physical Exam  Constitutional: She is oriented to person, place, and time. She appears well-developed.  HENT:  Head: Normocephalic.  Eyes: Conjunctivae and EOM are normal. No scleral icterus.  Neck: Neck supple. No thyromegaly present.  Cardiovascular: Normal rate and regular rhythm. Exam reveals no gallop and no friction rub.  No murmur heard. Pulmonary/Chest: No stridor. She has no wheezes. She has no rales. She exhibits no tenderness.  Abdominal: She exhibits no distension. There is no tenderness. There is no rebound.  Musculoskeletal: Normal range of motion. She exhibits no edema.  Lymphadenopathy:    She has no cervical adenopathy.  Neurological: She is oriented to person, place, and time. She  exhibits normal muscle tone. Coordination normal.  Skin: No rash noted. No erythema.  Psychiatric: She has a normal mood and affect. Her behavior is normal.     ED Treatments / Results  Labs (all labs ordered are listed, but only abnormal results are displayed) Labs Reviewed  COMPREHENSIVE METABOLIC PANEL - Abnormal; Notable for the following components:      Result Value   Sodium 134 (*)    Glucose, Bld 100 (*)    Calcium 8.8 (*)    Total Protein 8.4 (*)    All other components within normal limits  URINALYSIS, ROUTINE W REFLEX MICROSCOPIC - Abnormal; Notable for the following components:   Hgb urine dipstick MODERATE (*)  All other components within normal limits  LIPASE, BLOOD  CBC WITH DIFFERENTIAL/PLATELET  PREGNANCY, URINE    EKG None  Radiology Dg Abd Acute W/chest  Result Date: 03/28/2018 CLINICAL DATA:  Diarrhea for several days EXAM: DG ABDOMEN ACUTE W/ 1V CHEST COMPARISON:  06/28/2017 FINDINGS: Cardiac shadow is within normal limits. The lungs are well aerated bilaterally. Scattered large and small bowel gas is noted. No obstructive changes are seen. No findings to suggest constipation are noted. No abnormal mass or abnormal calcifications are seen. No bony abnormality is noted. IMPRESSION: No acute abnormality seen. Electronically Signed   By: Alcide Clever M.D.   On: 03/28/2018 08:38    Procedures Procedures (including critical care time)  Medications Ordered in ED Medications  sodium chloride 0.9 % bolus 1,000 mL (0 mLs Intravenous Stopped 03/28/18 0917)  ketorolac (TORADOL) 30 MG/ML injection 30 mg (30 mg Intravenous Given 03/28/18 0723)  ondansetron (ZOFRAN) injection 4 mg (4 mg Intravenous Given 03/28/18 0723)     Initial Impression / Assessment and Plan / ED Course  I have reviewed the triage vital signs and the nursing notes.  Pertinent labs & imaging results that were available during my care of the patient were reviewed by me and considered in my  medical decision making (see chart for details).     Labs including CBC chemistries are unremarkable. patient also had a urinalysis and acute abdominal series that are unremarkable.  Patient improved with Toradol and fluids.  Suspect viral syndrome causing abdominal cramping diarrhea.  Patient will be placed on Zofran and Bentyl and will follow-up with her primary care doctor or has been referred to GI  Final Clinical Impressions(s) / ED Diagnoses   Final diagnoses:  Nausea vomiting and diarrhea    ED Discharge Orders        Ordered    ondansetron (ZOFRAN ODT) 4 MG disintegrating tablet     03/28/18 1009    dicyclomine (BENTYL) 20 MG tablet     03/28/18 1009       Bethann Berkshire, MD 03/28/18 1015

## 2018-05-02 ENCOUNTER — Ambulatory Visit: Payer: 59 | Admitting: Adult Health

## 2018-05-02 ENCOUNTER — Encounter: Payer: Self-pay | Admitting: Adult Health

## 2018-05-02 VITALS — BP 99/58 | HR 80 | Ht 66.0 in | Wt 202.5 lb

## 2018-05-02 DIAGNOSIS — R42 Dizziness and giddiness: Secondary | ICD-10-CM

## 2018-05-02 DIAGNOSIS — R252 Cramp and spasm: Secondary | ICD-10-CM

## 2018-05-02 DIAGNOSIS — R400 Somnolence: Secondary | ICD-10-CM | POA: Diagnosis not present

## 2018-05-02 DIAGNOSIS — R5383 Other fatigue: Secondary | ICD-10-CM

## 2018-05-02 LAB — POCT HEMOGLOBIN: Hemoglobin: 13.4 g/dL (ref 12.2–16.2)

## 2018-05-02 NOTE — Progress Notes (Signed)
  Subjective:     Patient ID: Mary Moses, female   DOB: [birth year], 32 y.o.   MRN: [id number]  HPI Mary Moses is a 32 year old black female in complaining of feeling fatigued/tired and cold and dizzy at times.She says she is more drowsy and has cramps in arms, legs and esp hands. And this has been going on about 1.5 weeks now.   Review of Systems +fatigue/tired +cramps esp in hands,but arms and feet too Feels cold, dizzy and drowsy Has had to pee more frequently No known tick bite  denies any fever or swelling  Reviewed past medical,surgical, social and family history. Reviewed medications and allergies.     Objective:   Physical Exam BP (!) 99/58 (BP Location: Left Arm, Patient Position: Sitting, Cuff Size: Large)   Pulse 80   Ht 5\' 6"  (1.676 m)   Wt 202 lb 8 oz (91.9 kg)   BMI 32.68 kg/m  HBG 13.4. Skin warm and dry. Neck: mid line trachea, normal thyroid, good ROM, no lymphadenopathy noted. Lungs: clear to ausculation bilaterally. Cardiovascular: regular rate and rhythm.   PHQ 2 score 0.  Assessment:     1. Fatigue, unspecified type   2. Hand cramps   3. Dizzy   4. Drowsy       Plan:     Check CBC with diff,CMP and TSH,will talk when results back  Return in 2 months for physical

## 2018-05-03 ENCOUNTER — Telehealth: Payer: Self-pay | Admitting: Obstetrics & Gynecology

## 2018-05-03 LAB — CBC WITH DIFFERENTIAL/PLATELET
Basophils Absolute: 0 10*3/uL (ref 0.0–0.2)
Basos: 0 %
EOS (ABSOLUTE): 0.1 10*3/uL (ref 0.0–0.4)
EOS: 1 %
HEMATOCRIT: 39.7 % (ref 34.0–46.6)
HEMOGLOBIN: 12.9 g/dL (ref 11.1–15.9)
Immature Grans (Abs): 0 10*3/uL (ref 0.0–0.1)
Immature Granulocytes: 0 %
LYMPHS ABS: 2.5 10*3/uL (ref 0.7–3.1)
Lymphs: 24 %
MCH: 27.9 pg (ref 26.6–33.0)
MCHC: 32.5 g/dL (ref 31.5–35.7)
MCV: 86 fL (ref 79–97)
MONOCYTES: 7 %
Monocytes Absolute: 0.7 10*3/uL (ref 0.1–0.9)
NEUTROS ABS: 7 10*3/uL (ref 1.4–7.0)
Neutrophils: 68 %
Platelets: 233 10*3/uL (ref 150–450)
RBC: 4.63 x10E6/uL (ref 3.77–5.28)
RDW: 14.1 % (ref 12.3–15.4)
WBC: 10.3 10*3/uL (ref 3.4–10.8)

## 2018-05-03 LAB — COMPREHENSIVE METABOLIC PANEL
ALBUMIN: 4.1 g/dL (ref 3.5–5.5)
ALK PHOS: 59 IU/L (ref 39–117)
ALT: 16 IU/L (ref 0–32)
AST: 16 IU/L (ref 0–40)
Albumin/Globulin Ratio: 1.3 (ref 1.2–2.2)
BILIRUBIN TOTAL: 0.2 mg/dL (ref 0.0–1.2)
BUN / CREAT RATIO: 10 (ref 9–23)
BUN: 9 mg/dL (ref 6–20)
CHLORIDE: 102 mmol/L (ref 96–106)
CO2: 25 mmol/L (ref 20–29)
CREATININE: 0.91 mg/dL (ref 0.57–1.00)
Calcium: 9.4 mg/dL (ref 8.7–10.2)
GFR calc non Af Amer: 83 mL/min/{1.73_m2} (ref >59)
GFR, EST AFRICAN AMERICAN: 95 mL/min/{1.73_m2} (ref >59)
Globulin, Total: 3.2 g/dL (ref 1.5–4.5)
Glucose: 90 mg/dL (ref 65–99)
Potassium: 4.2 mmol/L (ref 3.5–5.2)
SODIUM: 140 mmol/L (ref 134–144)
TOTAL PROTEIN: 7.3 g/dL (ref 6.0–8.5)

## 2018-05-03 LAB — TSH: TSH: 0.87 u[IU]/mL (ref 0.450–4.500)

## 2018-05-03 NOTE — Telephone Encounter (Signed)
Spoke with pt letting her know her labs are all normal. Pt voiced understanding. JSY

## 2018-07-02 ENCOUNTER — Ambulatory Visit (INDEPENDENT_AMBULATORY_CARE_PROVIDER_SITE_OTHER): Payer: 59 | Admitting: Adult Health

## 2018-07-02 ENCOUNTER — Encounter: Payer: Self-pay | Admitting: Adult Health

## 2018-07-02 VITALS — BP 114/65 | HR 79 | Ht 66.0 in | Wt 204.0 lb

## 2018-07-02 DIAGNOSIS — Z975 Presence of (intrauterine) contraceptive device: Secondary | ICD-10-CM | POA: Diagnosis not present

## 2018-07-02 DIAGNOSIS — Z01419 Encounter for gynecological examination (general) (routine) without abnormal findings: Secondary | ICD-10-CM

## 2018-07-02 NOTE — Progress Notes (Signed)
Patient ID: Mary Moses, female   DOB: [birth year], 32 y.o.   MRN: [id number] History of Present Illness: Mary Moses is a 32 year old black female,single in for well woman gyn exam, had normal pap with negative HPV 06/28/16.    Current Medications, Allergies, Past Medical History, Past Surgical History, Family History and Social History were reviewed in [employer] record.     Review of Systems: Patient denies any headaches, hearing loss, fatigue, blurred vision, shortness of breath, chest pain, abdominal pain, problems with  urination, or intercourse. No joint pain or mood swings. +weight gain,about 25 lbs in last year +bloating +constipation is on linzess, but gives diarrhea at times (instructed to take on empty stomach and wait 1 hour before eating), talk with Dr Darrick Penna    Physical Exam:BP 114/65 (BP Location: Left Arm, Patient Position: Sitting, Cuff Size: Normal)   Pulse 79   Ht 5\' 6"  (1.676 m)   Wt 204 lb (92.5 kg)   BMI 32.93 kg/m  General:  Well developed, well nourished, no acute distress Skin:  Warm and dry Neck:  Midline trachea, normal thyroid, good ROM, no lymphadenopathy Lungs; Clear to auscultation bilaterally Breast:  No dominant palpable mass, retraction, or nipple discharge Cardiovascular: Regular rate and rhythm Abdomen:  Soft, non tender, no hepatosplenomegaly Pelvic:  External genitalia is normal in appearance, no lesions.  The vagina is normal in appearance. Urethra has no lesions or masses. The cervix is bulbous.  Uterus is felt to be normal size, shape, and contour.  No adnexal masses or tenderness noted.Bladder is non tender, no masses felt. Extremities/musculoskeletal:  No swelling or varicosities noted, no clubbing or cyanosis Psych:  No mood changes, alert and cooperative,seems happy PHQ 2 score 0.She declines STD testing. Examination chaperoned by Marchelle Folks Rash LPN.  Impression: 1. Encounter for well woman exam with routine  gynecological exam   2. Nexplanon in place       Plan: Return in 2 weeks for pre op for tubal and nexplanon removal Review handout by Gaylyn Rong on tubal  Pap and physical in 1 year Keep appt with Dr Darrick Penna

## 2018-07-19 ENCOUNTER — Ambulatory Visit: Payer: 59 | Admitting: Gastroenterology

## 2018-07-20 ENCOUNTER — Encounter: Payer: Self-pay | Admitting: Obstetrics & Gynecology

## 2018-07-20 ENCOUNTER — Ambulatory Visit (INDEPENDENT_AMBULATORY_CARE_PROVIDER_SITE_OTHER): Payer: 59 | Admitting: Obstetrics & Gynecology

## 2018-07-20 VITALS — BP 122/72 | HR 94 | Ht 66.0 in | Wt 205.5 lb

## 2018-07-20 DIAGNOSIS — Z3202 Encounter for pregnancy test, result negative: Secondary | ICD-10-CM | POA: Diagnosis not present

## 2018-07-20 DIAGNOSIS — Z3009 Encounter for other general counseling and advice on contraception: Secondary | ICD-10-CM | POA: Diagnosis not present

## 2018-07-20 LAB — POCT URINE PREGNANCY: Preg Test, Ur: NEGATIVE

## 2018-07-20 MED ORDER — VITAMIN D (ERGOCALCIFEROL) 1.25 MG (50000 UNIT) PO CAPS: 50000.0000 [IU] | ORAL_CAPSULE | ORAL | 6 refills | Status: DC

## 2018-07-20 NOTE — Progress Notes (Signed)
Preoperative History and Physical  Mary Moses is a 32 y.o. G1P1001 with No LMP recorded. Patient has had an implant. admitted for a laparoscopic bilateral salpingectomy for sterilization and removal of the nexplanon in her left arm.    PMH:    Past Medical History:  Diagnosis Date  . Abdominal bloating 09/16/2015  . Abnormal Pap smear   . Cramps, extremity 09/16/2015  . Drug induced amenorrhea 09/16/2015   Has nexplanon  . GERD (gastroesophageal reflux disease)   . Hypokalemia   . Nexplanon in place 09/16/2015  . Nexplanon insertion 07/09/2013   nexplanon inserted 8/26 left arm remove 07/09/16  . Obesity   . Pregnancy   . Swelling 06/19/2013  . Vaginal Pap smear, abnormal     PSH:     Past Surgical History:  Procedure Laterality Date  . CESAREAN SECTION N/A 06/08/2013   Procedure: Primary CESAREAN SECTION of baby girl  at 2056 APGAR 8/9;  Surgeon: Tereso Newcomer, MD;  Location: [facility];  Service: Obstetrics;  Laterality: N/A;    POb/GynH:      OB History    Gravida  1   Para  1   Term  1   Preterm      AB      Living  1     SAB      TAB      Ectopic      Multiple      Live Births  1           SH:   Social History   Tobacco Use  . Smoking status: Never Smoker  . Smokeless tobacco: Never Used  Substance Use Topics  . Alcohol use: No  . Drug use: No    FH:    Family History  Problem Relation Age of Onset  . Kidney disease Mother   . Heart disease Mother   . Hypertension Mother   . Cancer Father        metastatic prostate   . Hypertension Sister      Allergies:   Medications:       Current Outpatient Medications:  .  etonogestrel (NEXPLANON) 68 MG IMPL implant, Inject 1 each into the skin once., Disp: , Rfl:  .  LINZESS 145 MCG CAPS capsule, TAKE 1 CAPSULE BY MOUTH 30 MINUTES PRIOR TO YOUR FIRST MEAL., Disp: 30 capsule, Rfl: 11 .  ondansetron (ZOFRAN ODT) 4 MG disintegrating tablet, 4mg  ODT q4 hours prn nausea/vomit, Disp: 12  tablet, Rfl: 0 .  pantoprazole (PROTONIX) 20 MG tablet, TAKE 1 TABLET BY MOUTH TWICE DAILY, Disp: 60 tablet, Rfl: 4 .  ranitidine (ZANTAC) 150 MG tablet, 1 with each meal and at bedtime, Disp: 40 tablet, Rfl: 0 .  vitamin B-12 (CYANOCOBALAMIN) 500 MCG tablet, Take 500 mcg by mouth daily., Disp: , Rfl:  .  Vitamin D, Ergocalciferol, (DRISDOL) 50000 units CAPS capsule, Take 1 capsule (50,000 Units total) by mouth every 7 (seven) days. Mondays, Disp: 30 capsule, Rfl: 6  Review of Systems:   Review of Systems  Constitutional: Negative for fever, chills, weight loss, malaise/fatigue and diaphoresis.  HENT: Negative for hearing loss, ear pain, nosebleeds, congestion, sore throat, neck pain, tinnitus and ear discharge.   Eyes: Negative for blurred vision, double vision, photophobia, pain, discharge and redness.  Respiratory: Negative for cough, hemoptysis, sputum production, shortness of breath, wheezing and stridor.   Cardiovascular: Negative for chest pain, palpitations, orthopnea, claudication, leg swelling and PND.  Gastrointestinal: Positive  for abdominal pain. Negative for heartburn, nausea, vomiting, diarrhea, constipation, blood in stool and melena.  Genitourinary: Negative for dysuria, urgency, frequency, hematuria and flank pain.  Musculoskeletal: Negative for myalgias, back pain, joint pain and falls.  Skin: Negative for itching and rash.  Neurological: Negative for dizziness, tingling, tremors, sensory change, speech change, focal weakness, seizures, loss of consciousness, weakness and headaches.  Endo/Heme/Allergies: Negative for environmental allergies and polydipsia. Does not bruise/bleed easily.  Psychiatric/Behavioral: Negative for depression, suicidal ideas, hallucinations, memory loss and substance abuse. The patient is not nervous/anxious and does not have insomnia.      PHYSICAL EXAM:  Blood pressure 122/72, pulse 94, height 5\' 6"  (1.676 m), weight 205 lb 8 oz (93.2 kg).     Vitals reviewed. Constitutional: She is oriented to person, place, and time. She appears well-developed and well-nourished.  HENT:  Head: Normocephalic and atraumatic.  Right Ear: External ear normal.  Left Ear: External ear normal.  Nose: Nose normal.  Mouth/Throat: Oropharynx is clear and moist.  Eyes: Conjunctivae and EOM are normal. Pupils are equal, round, and reactive to light. Right eye exhibits no discharge. Left eye exhibits no discharge. No scleral icterus.  Neck: Normal range of motion. Neck supple. No tracheal deviation present. No thyromegaly present.  Cardiovascular: Normal rate, regular rhythm, normal heart sounds and intact distal pulses.  Exam reveals no gallop and no friction rub.   No murmur heard. Respiratory: Effort normal and breath sounds normal. No respiratory distress. She has no wheezes. She has no rales. She exhibits no tenderness.  GI: Soft. Bowel sounds are normal. She exhibits no distension and no mass. There is tenderness. There is no rebound and no guarding.  Genitourinary:       Vulva is normal without lesions Vagina is pink moist without discharge Cervix normal in appearance and pap is normal Uterus is normal size, contour, position, consistency, mobility, non-tender Adnexa is negative with normal sized ovaries by sonogram  Musculoskeletal: Normal range of motion. She exhibits no edema and no tenderness.  Neurological: She is alert and oriented to person, place, and time. She has normal reflexes. She displays normal reflexes. No cranial nerve deficit. She exhibits normal muscle tone. Coordination normal.  Skin: Skin is warm and dry. No rash noted. No erythema. No pallor.  Psychiatric: She has a normal mood and affect. Her behavior is normal. Judgment and thought content normal.    Labs: Results for orders placed or performed in visit on 07/20/18 (from the past 336 hour(s))  POCT urine pregnancy   Collection Time: 07/20/18 11:15 AM  Result Value Ref  Range   Preg Test, Ur Negative Negative    EKG: No orders found for this or any previous visit.  Imaging Studies: No results found.    Assessment: Parous female desires permanent sterilization Wants nexplanon removed Patient Active Problem List   Diagnosis Date Noted  . Gastroesophageal reflux disease 05/10/2017  . History of hypokalemia 05/10/2017  . Cramps, extremity 09/16/2015  . Abdominal bloating 09/16/2015  . Nexplanon in place 09/16/2015  . Drug induced amenorrhea 09/16/2015  . 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 Nexplanon insertion 07/09/2013  . Abnormal Pap smear of cervix 04/01/2013    Plan: Laparoscopic Bilateral Salpingectomy for sterilization and removal of nexplanon(left arm) 08/01/2018 to follow  Pt understands this is permanent sterilization  Lazaro Arms 07/20/2018 11:22 AM      Face to face time:  15 minutes  Greater than 50% of the visit time was  spent in counseling and coordination of care with the patient.  The summary and outline of the counseling and care coordination is summarized in the note above.   All questions were answered.

## 2018-08-01 ENCOUNTER — Emergency Department (HOSPITAL_COMMUNITY)
Admission: EM | Admit: 2018-08-01 | Discharge: 2018-08-01 | Disposition: A | Discharge status: Home / Self Care | Payer: 59 | Attending: Emergency Medicine | Admitting: Emergency Medicine

## 2018-08-01 ENCOUNTER — Encounter (HOSPITAL_COMMUNITY): Payer: Self-pay | Admitting: Emergency Medicine

## 2018-08-01 ENCOUNTER — Ambulatory Visit: Admit: 2018-08-01 | Payer: 59 | Admitting: Obstetrics & Gynecology

## 2018-08-01 ENCOUNTER — Other Ambulatory Visit: Payer: Self-pay

## 2018-08-01 DIAGNOSIS — Z79899 Other long term (current) drug therapy: Secondary | ICD-10-CM | POA: Insufficient documentation

## 2018-08-01 DIAGNOSIS — R519 Headache, unspecified: Secondary | ICD-10-CM

## 2018-08-01 DIAGNOSIS — R51 Headache: Secondary | ICD-10-CM | POA: Diagnosis not present

## 2018-08-01 DIAGNOSIS — R0789 Other chest pain: Secondary | ICD-10-CM | POA: Diagnosis not present

## 2018-08-01 LAB — COMPREHENSIVE METABOLIC PANEL
ALBUMIN: 3.7 g/dL (ref 3.5–5.0)
ALT: 18 U/L (ref 0–44)
ANION GAP: 4 — AB (ref 5–15)
AST: 18 U/L (ref 15–41)
Alkaline Phosphatase: 52 U/L (ref 38–126)
BILIRUBIN TOTAL: 0.4 mg/dL (ref 0.3–1.2)
BUN: 10 mg/dL (ref 6–20)
CHLORIDE: 107 mmol/L (ref 98–111)
CO2: 28 mmol/L (ref 22–32)
Calcium: 8.6 mg/dL — ABNORMAL LOW (ref 8.9–10.3)
Creatinine, Ser: 0.85 mg/dL (ref 0.44–1.00)
GFR calc Af Amer: >60 mL/min (ref >60)
GFR calc non Af Amer: >60 mL/min (ref >60)
GLUCOSE: 107 mg/dL — AB (ref 70–99)
POTASSIUM: 3.5 mmol/L (ref 3.5–5.1)
SODIUM: 139 mmol/L (ref 135–145)
TOTAL PROTEIN: 7.5 g/dL (ref 6.5–8.1)

## 2018-08-01 LAB — I-STAT BETA HCG BLOOD, ED (MC, WL, AP ONLY): I-stat hCG, quantitative: <5 m[IU]/mL (ref <5)

## 2018-08-01 LAB — CBC WITH DIFFERENTIAL/PLATELET
BASOS ABS: 0 10*3/uL (ref 0.0–0.1)
Basophils Relative: 0 %
EOS PCT: 1 %
Eosinophils Absolute: 0.1 10*3/uL (ref 0.0–0.7)
HEMATOCRIT: 39.7 % (ref 36.0–46.0)
Hemoglobin: 12.8 g/dL (ref 12.0–15.0)
LYMPHS ABS: 2.9 10*3/uL (ref 0.7–4.0)
LYMPHS PCT: 27 %
MCH: 28.4 pg (ref 26.0–34.0)
MCHC: 32.2 g/dL (ref 30.0–36.0)
MCV: 88.2 fL (ref 78.0–100.0)
Monocytes Absolute: 0.9 10*3/uL (ref 0.1–1.0)
Monocytes Relative: 8 %
NEUTROS ABS: 7 10*3/uL (ref 1.7–7.7)
Neutrophils Relative %: 64 %
Platelets: 205 10*3/uL (ref 150–400)
RBC: 4.5 MIL/uL (ref 3.87–5.11)
RDW: 13.7 % (ref 11.5–15.5)
WBC: 10.9 10*3/uL — ABNORMAL HIGH (ref 4.0–10.5)

## 2018-08-01 LAB — TROPONIN I: Troponin I: <0.03 ng/mL (ref <0.03)

## 2018-08-01 SURGERY — SALPINGECTOMY, BILATERAL, LAPAROSCOPIC
Anesthesia: General | Site: Vagina | Laterality: Left

## 2018-08-01 MED ORDER — KETOROLAC TROMETHAMINE 30 MG/ML IJ SOLN
15.0000 mg | Freq: Once | INTRAMUSCULAR | Status: AC
  Administered 2018-08-01: 15 mg via INTRAVENOUS
  Filled 2018-08-01: qty 1

## 2018-08-01 MED ORDER — PROCHLORPERAZINE EDISYLATE 10 MG/2ML IJ SOLN
10.0000 mg | Freq: Once | INTRAMUSCULAR | Status: AC
  Administered 2018-08-01: 10 mg via INTRAVENOUS
  Filled 2018-08-01: qty 2

## 2018-08-01 MED ORDER — SODIUM CHLORIDE 0.9 % IV BOLUS
1000.0000 mL | Freq: Once | INTRAVENOUS | Status: AC
  Administered 2018-08-01: 1000 mL via INTRAVENOUS

## 2018-08-01 NOTE — Discharge Instructions (Addendum)
As discussed, your evaluation today has been largely reassuring.  But, it is important that you monitor your condition carefully, and do not hesitate to return to the ED if you develop new, or concerning changes in your condition. ? ?Otherwise, please follow-up with your physician for appropriate ongoing care. ? ?

## 2018-08-01 NOTE — ED Provider Notes (Signed)
[facility] EMERGENCY DEPARTMENT Provider Note   CSN: [id number] Arrival date & time: 08/01/18  1832     History   Chief Complaint Chief Complaint  Patient presents with  . Migraine    HPI Mary Moses is a 32 y.o. female.  HPI  Patient presents with concern of headache and chest pain. Patient actually has headache, chest pain, generalized discomfort, and anxiousness. She states that she is generally well, does have a history of headaches sometimes, and today's headache is in a similar distribution, with a tight sensation, circumferential. Onset of all of the above was earlier today, unclear exact time. However, since onset symptoms have been persistent. No difficulty breathing, no syncope, no fever, no cough. No clear alleviating or exacerbating factors, and seemingly new medication taken for pain relief.    Past Medical History:  Diagnosis Date  . Abdominal bloating 09/16/2015  . Abnormal Pap smear   . Cramps, extremity 09/16/2015  . Drug induced amenorrhea 09/16/2015   Has nexplanon  . GERD (gastroesophageal reflux disease)   . Hypokalemia   . Nexplanon in place 09/16/2015  . Nexplanon insertion 07/09/2013   nexplanon inserted 8/26 left arm remove 07/09/16  . Obesity   . Pregnancy   . Swelling 06/19/2013  . Vaginal Pap smear, abnormal     Patient Active Problem List   Diagnosis Date Noted  . Gastroesophageal reflux disease 05/10/2017  . History of hypokalemia 05/10/2017  . Cramps, extremity 09/16/2015  . Abdominal bloating 09/16/2015  . Nexplanon in place 09/16/2015  . Drug induced amenorrhea 09/16/2015  . 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 Nexplanon insertion 07/09/2013  . Abnormal Pap smear of cervix 04/01/2013    Past Surgical History:  Procedure Laterality Date  . CESAREAN SECTION N/A 06/08/2013   Procedure: Primary CESAREAN SECTION of baby girl  at 2056 APGAR 8/9;  Surgeon: Tereso Newcomer, MD;  Location: [facility];  Service: Obstetrics;   Laterality: N/A;     OB History    Gravida  1   Para  1   Term  1   Preterm      AB      Living  1     SAB      TAB      Ectopic      Multiple      Live Births  1            Home Medications    Prior to Admission medications   Medication Sig Start Date End Date Taking? Authorizing Provider  etonogestrel (NEXPLANON) 68 MG IMPL implant Inject 1 each into the skin once.   Yes [provider]  LINZESS 145 MCG CAPS capsule TAKE 1 CAPSULE BY MOUTH 30 MINUTES PRIOR TO YOUR FIRST MEAL. Patient taking differently: Take 145 mcg by mouth daily as needed.  03/14/18  Yes Anice Paganini, NP  pantoprazole (PROTONIX) 20 MG tablet TAKE 1 TABLET BY MOUTH TWICE DAILY Patient taking differently: Take 20 mg by mouth 2 (two) times daily.  01/31/18  Yes Adline Potter, NP  vitamin B-12 (CYANOCOBALAMIN) 500 MCG tablet Take 500 mcg by mouth daily.   Yes [provider]  Vitamin D, Ergocalciferol, (DRISDOL) 50000 units CAPS capsule Take 1 capsule (50,000 Units total) by mouth every 7 (seven) days. Mondays 07/20/18  Yes Lazaro Arms, MD    Family History Family History  Problem Relation Age of Onset  . Kidney disease Mother   . Heart disease Mother   .  Hypertension Mother   . Cancer Father        metastatic prostate   . Hypertension Sister     Social History Social History   Tobacco Use  . Smoking status: Never Smoker  . Smokeless tobacco: Never Used  Substance Use Topics  . Alcohol use: No  . Drug use: No     Allergies   Penicillins   Review of Systems Review of Systems  Constitutional:       Per HPI, otherwise negative  HENT:       Per HPI, otherwise negative  Respiratory:       Per HPI, otherwise negative  Cardiovascular:       Per HPI, otherwise negative  Gastrointestinal: Negative for vomiting.  Endocrine:       Negative aside from HPI  Genitourinary:       Neg aside from HPI   Musculoskeletal:       Per HPI, otherwise negative    Skin: Negative.   Neurological: Negative for syncope.     Physical Exam Updated Vital Signs BP 127/86 (BP Location: Right Arm)   Pulse 85   Temp 98.4 F (36.9 C) (Oral)   Resp 16   Ht 5\' 6"  (1.676 m)   Wt 93.2 kg   SpO2 100%   BMI 33.17 kg/m   Physical Exam  Constitutional: She is oriented to person, place, and time. She appears well-developed and well-nourished. No distress.  HENT:  Head: Normocephalic and atraumatic.  Eyes: Conjunctivae and EOM are normal.  Cardiovascular: Normal rate and regular rhythm.  Pulmonary/Chest: Effort normal and breath sounds normal. No stridor. No respiratory distress.  Abdominal: She exhibits no distension.  Musculoskeletal: She exhibits no edema.  Neurological: She is alert and oriented to person, place, and time. She displays no atrophy and no tremor. No cranial nerve deficit. She exhibits normal muscle tone. She displays no seizure activity. Coordination normal.  Skin: Skin is warm and dry.  Psychiatric: She has a normal mood and affect.  Nursing note and vitals reviewed.    ED Treatments / Results  Labs (all labs ordered are listed, but only abnormal results are displayed) Labs Reviewed  COMPREHENSIVE METABOLIC PANEL - Abnormal; Notable for the following components:      Result Value   Glucose, Bld 107 (*)    Calcium 8.6 (*)    Anion gap 4 (*)    All other components within normal limits  CBC WITH DIFFERENTIAL/PLATELET - Abnormal; Notable for the following components:   WBC 10.9 (*)    All other components within normal limits  TROPONIN I  I-STAT BETA HCG BLOOD, ED ([facility], [facility], [facility] ONLY)    EKG EKG Interpretation  Date/Time:  Wednesday August 01 2018 18:50:54 EDT Ventricular Rate:  77 PR Interval:  172 QRS Duration: 82 QT Interval:  368 QTC Calculation: 416 R Axis:   40 Text Interpretation:  Normal sinus rhythm Normal ECG Normal ECG Confirmed by Gerhard Munch [phone]) on 08/01/2018 8:25:11 PM   Radiology No results  found.  Procedures Procedures (including critical care time)  Medications Ordered in ED Medications  sodium chloride 0.9 % bolus 1,000 mL (1,000 mLs Intravenous New Bag/Given 08/01/18 2025)  ketorolac (TORADOL) 30 MG/ML injection 15 mg (15 mg Intravenous Given 08/01/18 2026)  prochlorperazine (COMPAZINE) injection 10 mg (10 mg Intravenous Given 08/01/18 2026)     Initial Impression / Assessment and Plan / ED Course  I have reviewed the triage vital signs and the nursing  notes.  Pertinent labs & imaging results that were available during my care of the patient were reviewed by me and considered in my medical decision making (see chart for details).     9:34 PM On repeat exam the patient is awake and alert, in no distress, speaking clearly, states that she feels better. She remains hemodynamically unremarkable. Patient's evaluation reviewed with her, including reassuring labs, EKG, absence of evidence for ACS, other acute new thoracic pathology. In addition, the patient's headache is improved, and without neurologic findings, there is low suspicion for CNS pathology. Patient acknowledges substantial stress at work, there is some suspicion for this contributing to her presentation. With the after mentioned reassuring findings, she was discharged in stable condition.  Final Clinical Impressions(s) / ED Diagnoses   Final diagnoses:  Bad headache  Atypical chest pain     Gerhard Munch, MD 08/01/18 2135

## 2018-08-01 NOTE — ED Triage Notes (Signed)
Pt reports migraine since yesterday. Also reports chest pain. Pt does report increased stress at job for past two days.denies SOB,or vomiting.

## 2018-08-02 ENCOUNTER — Encounter: Payer: Self-pay | Admitting: Advanced Practice Midwife

## 2018-08-02 ENCOUNTER — Ambulatory Visit (INDEPENDENT_AMBULATORY_CARE_PROVIDER_SITE_OTHER): Payer: 59 | Admitting: Advanced Practice Midwife

## 2018-08-02 VITALS — BP 116/65 | HR 93 | Ht 66.0 in | Wt 207.0 lb

## 2018-08-02 DIAGNOSIS — Z3202 Encounter for pregnancy test, result negative: Secondary | ICD-10-CM

## 2018-08-02 DIAGNOSIS — Z3049 Encounter for surveillance of other contraceptives: Secondary | ICD-10-CM | POA: Diagnosis not present

## 2018-08-02 DIAGNOSIS — Z3046 Encounter for surveillance of implantable subdermal contraceptive: Secondary | ICD-10-CM

## 2018-08-02 LAB — POCT URINE PREGNANCY: PREG TEST UR: NEGATIVE

## 2018-08-02 MED ORDER — NORELGESTROMIN-ETH ESTRADIOL 150-35 MCG/24HR TD PTWK: 1.0000 | MEDICATED_PATCH | TRANSDERMAL | 12 refills | Status: DC

## 2018-08-02 NOTE — Patient Instructions (Signed)

## 2018-08-02 NOTE — Progress Notes (Signed)
HPI:  Mary Moses 32 y.o. here for Nexplanon removal.  Her future plans for birth control are patch. She had a preop for BTL but "chickened out"  Has gained weight on Nexplanon, aware of 198lb weight restriction for 99% efficacy on patch, .  Past Medical History: Past Medical History:  Diagnosis Date  . Abdominal bloating 09/16/2015  . Abnormal Pap smear   . Cramps, extremity 09/16/2015  . Drug induced amenorrhea 09/16/2015   Has nexplanon  . GERD (gastroesophageal reflux disease)   . Hypokalemia   . Nexplanon in place 09/16/2015  . Nexplanon insertion 07/09/2013   nexplanon inserted 8/26 left arm remove 07/09/16  . Obesity   . Pregnancy   . Swelling 06/19/2013  . Vaginal Pap smear, abnormal     Past Surgical History: Past Surgical History:  Procedure Laterality Date  . CESAREAN SECTION N/A 06/08/2013   Procedure: Primary CESAREAN SECTION of baby girl  at 2056 APGAR 8/9;  Surgeon: Tereso Newcomer, MD;  Location: [facility];  Service: Obstetrics;  Laterality: N/A;    Family History: Family History  Problem Relation Age of Onset  . Kidney disease Mother   . Heart disease Mother   . Hypertension Mother   . Cancer Father        metastatic prostate   . Hypertension Sister     Social History: Social History   Tobacco Use  . Smoking status: Never Smoker  . Smokeless tobacco: Never Used  Substance Use Topics  . Alcohol use: No  . Drug use: No    Allergies:  PCN   Patient given informed consent for removal of her Nexplanon, time out was performed.  Signed copy in the chart.  Appropriate time out taken. Implanon site identified.  Area prepped in usual sterile fashon. One cc of 1% lidocaine was used to anesthetize the area at the distal end of the implant. A small stab incision was made right beside the implant on the distal portion.  The Nexplanon rod was grasped using hemostats and removed without difficulty.  There was less than 3 cc blood loss. There were no complications.   A small amount of antibiotic ointment and steri-strips were applied over the small incision.  A pressure bandage was applied to reduce any bruising.  The patient tolerated the procedure well and was given post procedure instructions.

## 2018-08-09 ENCOUNTER — Encounter: Payer: 59 | Admitting: Obstetrics & Gynecology

## 2018-08-13 ENCOUNTER — Telehealth: Payer: Self-pay | Admitting: *Deleted

## 2018-08-14 NOTE — Telephone Encounter (Signed)
Patient states the [facility] patches are not staying on and would like to switch to BCP.  Please advise.

## 2018-08-15 ENCOUNTER — Other Ambulatory Visit: Payer: Self-pay | Admitting: Advanced Practice Midwife

## 2018-08-15 MED ORDER — NORGESTIMATE-ETH ESTRADIOL 0.25-35 MG-MCG PO TABS: 1.0000 | ORAL_TABLET | Freq: Every day | ORAL | 11 refills | Status: DC

## 2018-08-15 NOTE — Progress Notes (Signed)
sprintec d/t patch not sticking to skin

## 2018-10-14 ENCOUNTER — Other Ambulatory Visit: Payer: Self-pay | Admitting: Adult Health

## 2018-11-09 ENCOUNTER — Ambulatory Visit (INDEPENDENT_AMBULATORY_CARE_PROVIDER_SITE_OTHER): Payer: 59 | Admitting: Obstetrics and Gynecology

## 2018-11-09 ENCOUNTER — Emergency Department (HOSPITAL_COMMUNITY): Payer: 59

## 2018-11-09 ENCOUNTER — Other Ambulatory Visit: Payer: Self-pay

## 2018-11-09 ENCOUNTER — Emergency Department (HOSPITAL_COMMUNITY)
Admission: EM | Admit: 2018-11-09 | Discharge: 2018-11-09 | Disposition: A | Discharge status: Home / Self Care | Payer: 59 | Attending: Emergency Medicine | Admitting: Emergency Medicine

## 2018-11-09 ENCOUNTER — Encounter (HOSPITAL_COMMUNITY): Payer: Self-pay

## 2018-11-09 ENCOUNTER — Encounter: Payer: Self-pay | Admitting: Obstetrics and Gynecology

## 2018-11-09 DIAGNOSIS — Z79899 Other long term (current) drug therapy: Secondary | ICD-10-CM | POA: Diagnosis not present

## 2018-11-09 DIAGNOSIS — R200 Anesthesia of skin: Secondary | ICD-10-CM | POA: Insufficient documentation

## 2018-11-09 DIAGNOSIS — R202 Paresthesia of skin: Secondary | ICD-10-CM | POA: Insufficient documentation

## 2018-11-09 LAB — COMPREHENSIVE METABOLIC PANEL
ALT: 18 U/L (ref 0–44)
AST: 16 U/L (ref 15–41)
Albumin: 3.6 g/dL (ref 3.5–5.0)
Alkaline Phosphatase: 46 U/L (ref 38–126)
Anion gap: 6 (ref 5–15)
BUN: 13 mg/dL (ref 6–20)
CO2: 25 mmol/L (ref 22–32)
Calcium: 8.6 mg/dL — ABNORMAL LOW (ref 8.9–10.3)
Chloride: 106 mmol/L (ref 98–111)
Creatinine, Ser: 0.79 mg/dL (ref 0.44–1.00)
GFR calc non Af Amer: >60 mL/min (ref >60)
Glucose, Bld: 86 mg/dL (ref 70–99)
Potassium: 3.5 mmol/L (ref 3.5–5.1)
SODIUM: 137 mmol/L (ref 135–145)
Total Bilirubin: 0.6 mg/dL (ref 0.3–1.2)
Total Protein: 7.9 g/dL (ref 6.5–8.1)

## 2018-11-09 LAB — CBC WITH DIFFERENTIAL/PLATELET
Abs Immature Granulocytes: 0.04 10*3/uL (ref 0.00–0.07)
BASOS PCT: 0 %
Basophils Absolute: 0 10*3/uL (ref 0.0–0.1)
Eosinophils Absolute: 0 10*3/uL (ref 0.0–0.5)
Eosinophils Relative: 0 %
HCT: 41.8 % (ref 36.0–46.0)
Hemoglobin: 12.8 g/dL (ref 12.0–15.0)
Immature Granulocytes: 0 %
Lymphocytes Relative: 21 %
Lymphs Abs: 1.9 10*3/uL (ref 0.7–4.0)
MCH: 26.9 pg (ref 26.0–34.0)
MCHC: 30.6 g/dL (ref 30.0–36.0)
MCV: 88 fL (ref 80.0–100.0)
Monocytes Absolute: 0.9 10*3/uL (ref 0.1–1.0)
Monocytes Relative: 10 %
Neutro Abs: 6.1 10*3/uL (ref 1.7–7.7)
Neutrophils Relative %: 69 %
Platelets: 237 10*3/uL (ref 150–400)
RBC: 4.75 MIL/uL (ref 3.87–5.11)
RDW: 13.2 % (ref 11.5–15.5)
WBC: 9 10*3/uL (ref 4.0–10.5)
nRBC: 0 % (ref 0.0–0.2)

## 2018-11-09 MED ORDER — PREDNISONE 20 MG PO TABS: ORAL_TABLET | ORAL | 0 refills | Status: DC

## 2018-11-09 NOTE — Discharge Instructions (Addendum)
Follow-up with Dr. Gerilyn Pilgrim in the next couple weeks for your numbness

## 2018-11-09 NOTE — ED Triage Notes (Addendum)
Pt reports numbness to right side for 2 weeks. Pt reports right side of face, hand and right foot numbness comes and goes. Reports pain in right wrist. . HA every other day. Mild pain lower back. [facility]

## 2018-11-09 NOTE — Progress Notes (Signed)
Pt presents with c/o face, arm and hand numbness, intermittent for the last several weeks. Pt does not have a PCP Pt advised to go to ER or secure a PCP

## 2018-11-09 NOTE — ED Provider Notes (Signed)
[facility] EMERGENCY DEPARTMENT Provider Note   CSN: [id number] Arrival date & time: 11/09/18  1116     History   Chief Complaint Chief Complaint  Patient presents with  . Numbness    HPI Mary Moses is a 32 y.o. female.  Patient states she is been having numbness to her face right arm right leg for 3 weeks now.  No other history  The history is provided by the patient. No language interpreter was used.  Illness  This is a new problem. The current episode started more than 2 days ago. The problem occurs hourly. The problem has not changed since onset.Pertinent negatives include no chest pain, no abdominal pain and no headaches. Nothing aggravates the symptoms. Nothing relieves the symptoms. She has tried nothing for the symptoms. The treatment provided no relief.    Past Medical History:  Diagnosis Date  . Abdominal bloating 09/16/2015  . Abnormal Pap smear   . Cramps, extremity 09/16/2015  . Drug induced amenorrhea 09/16/2015   Has nexplanon  . GERD (gastroesophageal reflux disease)   . Hypokalemia   . Nexplanon in place 09/16/2015  . Nexplanon insertion 07/09/2013   nexplanon inserted 8/26 left arm remove 07/09/16  . Obesity   . Pregnancy   . Swelling 06/19/2013  . Vaginal Pap smear, abnormal     Patient Active Problem List   Diagnosis Date Noted  . Numbness and tingling 11/09/2018  . Gastroesophageal reflux disease 05/10/2017  . History of hypokalemia 05/10/2017  . Cramps, extremity 09/16/2015  . Abdominal bloating 09/16/2015  . Nexplanon in place 09/16/2015  . Drug induced amenorrhea 09/16/2015  . 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 Nexplanon insertion 07/09/2013  . Abnormal Pap smear of cervix 04/01/2013    Past Surgical History:  Procedure Laterality Date  . CESAREAN SECTION N/A 06/08/2013   Procedure: Primary CESAREAN SECTION of baby girl  at 2056 APGAR 8/9;  Surgeon: Tereso Newcomer, MD;  Location: [facility];  Service: Obstetrics;  Laterality:  N/A;     OB History    Gravida  1   Para  1   Term  1   Preterm      AB      Living  1     SAB      TAB      Ectopic      Multiple      Live Births  1            Home Medications    Prior to Admission medications   Medication Sig Start Date End Date Taking? Authorizing Provider  LINZESS 145 MCG CAPS capsule TAKE 1 CAPSULE BY MOUTH 30 MINUTES PRIOR TO YOUR FIRST MEAL. Patient taking differently: Take 145 mcg by mouth daily as needed.  03/14/18  Yes Anice Paganini, NP  norelgestromin-ethinyl estradiol Burr Medico) 150-35 MCG/24HR transdermal patch Place 1 patch onto the skin once a week.   Yes [provider]  pantoprazole (PROTONIX) 20 MG tablet TAKE 1 TABLET BY MOUTH TWICE DAILY 10/15/18  Yes Cyril Mourning A, NP  vitamin B-12 (CYANOCOBALAMIN) 500 MCG tablet Take 500 mcg by mouth daily.   Yes [provider]  Vitamin D, Ergocalciferol, (DRISDOL) 50000 units CAPS capsule Take 1 capsule (50,000 Units total) by mouth every 7 (seven) days. Mondays 07/20/18  Yes Lazaro Arms, MD  predniSONE (DELTASONE) 20 MG tablet 2 tabs po daily x 3 days 11/09/18   Bethann Berkshire, MD    Family History  Family History  Problem Relation Age of Onset  . Kidney disease Mother   . Heart disease Mother   . Hypertension Mother   . Cancer Father        metastatic prostate   . Hypertension Sister     Social History Social History   Tobacco Use  . Smoking status: Never Smoker  . Smokeless tobacco: Never Used  Substance Use Topics  . Alcohol use: No  . Drug use: No     Allergies   Penicillins   Review of Systems Review of Systems  Constitutional: Negative for appetite change and fatigue.  HENT: Negative for congestion, ear discharge and sinus pressure.   Eyes: Negative for discharge.  Respiratory: Negative for cough.   Cardiovascular: Negative for chest pain.  Gastrointestinal: Negative for abdominal pain and diarrhea.  Genitourinary: Negative for  frequency and hematuria.  Musculoskeletal: Negative for back pain.  Skin: Negative for rash.  Neurological: Positive for numbness. Negative for seizures and headaches.  Psychiatric/Behavioral: Negative for hallucinations.     Physical Exam Updated Vital Signs BP 117/76   Pulse 92   Temp 98 F (36.7 C) (Oral)   Resp 18   Ht 5\' 6"  (1.676 m)   Wt 95.3 kg   SpO2 100%   BMI 33.89 kg/m   Physical Exam Vitals signs reviewed.  Constitutional:      Appearance: She is well-developed.  HENT:     Head: Normocephalic.     Mouth/Throat:     Mouth: Mucous membranes are moist.  Eyes:     General: No scleral icterus.    Conjunctiva/sclera: Conjunctivae normal.  Neck:     Musculoskeletal: Neck supple.     Thyroid: No thyromegaly.  Cardiovascular:     Rate and Rhythm: Normal rate and regular rhythm.     Heart sounds: No murmur. No friction rub. No gallop.   Pulmonary:     Breath sounds: No stridor. No wheezing or rales.  Chest:     Chest wall: No tenderness.  Abdominal:     General: There is no distension.     Tenderness: There is no abdominal tenderness. There is no rebound.  Musculoskeletal: Normal range of motion.  Lymphadenopathy:     Cervical: No cervical adenopathy.  Skin:    Findings: No erythema or rash.  Neurological:     Mental Status: She is oriented to person, place, and time.     Motor: No abnormal muscle tone.     Coordination: Coordination normal.     Comments: Questionable decreased sensation and her right arm right leg  Psychiatric:        Behavior: Behavior normal.      ED Treatments / Results  Labs (all labs ordered are listed, but only abnormal results are displayed) Labs Reviewed  COMPREHENSIVE METABOLIC PANEL - Abnormal; Notable for the following components:      Result Value   Calcium 8.6 (*)    All other components within normal limits  CBC WITH DIFFERENTIAL/PLATELET    EKG None  Radiology Ct Head Wo Contrast  Result Date:  11/09/2018 CLINICAL DATA:  Right-sided numbness, headache. EXAM: CT HEAD WITHOUT CONTRAST CT CERVICAL SPINE WITHOUT CONTRAST TECHNIQUE: Multidetector CT imaging of the head and cervical spine was performed following the standard protocol without intravenous contrast. Multiplanar CT image reconstructions of the cervical spine were also generated. COMPARISON:  CT scan of June 28, 2017. FINDINGS: CT HEAD FINDINGS Brain: No evidence of acute infarction, hemorrhage, hydrocephalus, extra-axial collection  or mass lesion/mass effect. Vascular: No hyperdense vessel or unexpected calcification. Skull: Normal. Negative for fracture or focal lesion. Sinuses/Orbits: No acute finding. Other: None. CT CERVICAL SPINE FINDINGS Alignment: Normal. Skull base and vertebrae: No acute fracture. No primary bone lesion or focal pathologic process. Soft tissues and spinal canal: No prevertebral fluid or swelling. No visible canal hematoma. Disc levels:  None. Upper chest: Negative. Other: None. IMPRESSION: Normal head CT. Normal cervical spine. Electronically Signed   By: Lupita RaiderJames  Green Jr, M.D.   On: 11/09/2018 16:05   Ct Cervical Spine Wo Contrast  Result Date: 11/09/2018 CLINICAL DATA:  Right-sided numbness, headache. EXAM: CT HEAD WITHOUT CONTRAST CT CERVICAL SPINE WITHOUT CONTRAST TECHNIQUE: Multidetector CT imaging of the head and cervical spine was performed following the standard protocol without intravenous contrast. Multiplanar CT image reconstructions of the cervical spine were also generated. COMPARISON:  CT scan of June 28, 2017. FINDINGS: CT HEAD FINDINGS Brain: No evidence of acute infarction, hemorrhage, hydrocephalus, extra-axial collection or mass lesion/mass effect. Vascular: No hyperdense vessel or unexpected calcification. Skull: Normal. Negative for fracture or focal lesion. Sinuses/Orbits: No acute finding. Other: None. CT CERVICAL SPINE FINDINGS Alignment: Normal. Skull base and vertebrae: No acute fracture.  No primary bone lesion or focal pathologic process. Soft tissues and spinal canal: No prevertebral fluid or swelling. No visible canal hematoma. Disc levels:  None. Upper chest: Negative. Other: None. IMPRESSION: Normal head CT. Normal cervical spine. Electronically Signed   By: Lupita RaiderJames  Green Jr, M.D.   On: 11/09/2018 16:05    Procedures Procedures (including critical care time)  Medications Ordered in ED Medications - No data to display   Initial Impression / Assessment and Plan / ED Course  I have reviewed the triage vital signs and the nursing notes.  Pertinent labs & imaging results that were available during my care of the patient were reviewed by me and considered in my medical decision making (see chart for details).     Labs and CT scan unremarkable.  Patient will follow-up with neurology in the next couple weeks for recheck.  She is placed on short course of prednisone  Final Clinical Impressions(s) / ED Diagnoses   Final diagnoses:  Paresthesia    ED Discharge Orders         Ordered    predniSONE (DELTASONE) 20 MG tablet     11/09/18 1632           Bethann BerkshireZammit, Maciah Feeback, MD 11/09/18 1636

## 2018-12-23 ENCOUNTER — Other Ambulatory Visit: Payer: Self-pay

## 2018-12-23 ENCOUNTER — Encounter (HOSPITAL_COMMUNITY): Payer: Self-pay | Admitting: Emergency Medicine

## 2018-12-23 ENCOUNTER — Emergency Department (HOSPITAL_COMMUNITY)
Admission: EM | Admit: 2018-12-23 | Discharge: 2018-12-23 | Disposition: A | Discharge status: Home / Self Care | Payer: 59 | Attending: Emergency Medicine | Admitting: Emergency Medicine

## 2018-12-23 DIAGNOSIS — R200 Anesthesia of skin: Secondary | ICD-10-CM

## 2018-12-23 DIAGNOSIS — R202 Paresthesia of skin: Secondary | ICD-10-CM | POA: Diagnosis present

## 2018-12-23 DIAGNOSIS — Z79899 Other long term (current) drug therapy: Secondary | ICD-10-CM | POA: Insufficient documentation

## 2018-12-23 MED ORDER — PREDNISONE 10 MG PO TABS: 40.0000 mg | ORAL_TABLET | Freq: Every day | ORAL | 0 refills | Status: DC

## 2018-12-23 NOTE — Discharge Instructions (Signed)
Take the prednisone as directed.  Very important to follow-up with neurology.  You do need an MRI for additional evaluation.  Would retry Dr. Ronal Fear office.  If they will see you then I would call [facility] neurology down in [place].  Return for any new or worse symptoms.

## 2018-12-23 NOTE — ED Provider Notes (Signed)
[facility] EMERGENCY DEPARTMENT Provider Note   CSN: [id number] Arrival date & time: 12/23/18  1522     History   Chief Complaint Chief Complaint  Patient presents with  . Numbness    HPI Mary Moses is a 33 y.o. female.  Patient seen December 27 with right-sided numbness.  Patient had a head CT and CT neck at that time without any acute findings.  Also had normal labs.  Patient currently does not have primary care doctor.  She was referred to neurology here locally but she claims they refused to see her.  Patient was treated with a short course of steroids which helped significantly.  Symptoms reoccurred on Monday so almost a week ago identical to the way it was before same locations.  Not associated with any weakness any speech problems any headache any facial asymmetry.  No family history of MS.  No fevers.     Past Medical History:  Diagnosis Date  . Abdominal bloating 09/16/2015  . Abnormal Pap smear   . Cramps, extremity 09/16/2015  . Drug induced amenorrhea 09/16/2015   Has nexplanon  . GERD (gastroesophageal reflux disease)   . Hypokalemia   . Nexplanon in place 09/16/2015  . Nexplanon insertion 07/09/2013   nexplanon inserted 8/26 left arm remove 07/09/16  . Obesity   . Pregnancy   . Swelling 06/19/2013  . Vaginal Pap smear, abnormal     Patient Active Problem List   Diagnosis Date Noted  . Numbness and tingling 11/09/2018  . Gastroesophageal reflux disease 05/10/2017  . History of hypokalemia 05/10/2017  . Cramps, extremity 09/16/2015  . Abdominal bloating 09/16/2015  . Nexplanon in place 09/16/2015  . Drug induced amenorrhea 09/16/2015  . 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 Nexplanon insertion 07/09/2013  . Abnormal Pap smear of cervix 04/01/2013    Past Surgical History:  Procedure Laterality Date  . CESAREAN SECTION N/A 06/08/2013   Procedure: Primary CESAREAN SECTION of baby girl  at 2056 APGAR 8/9;  Surgeon: Tereso Newcomer, MD;  Location:  [facility];  Service: Obstetrics;  Laterality: N/A;     OB History    Gravida  1   Para  1   Term  1   Preterm      AB      Living  1     SAB      TAB      Ectopic      Multiple      Live Births  1            Home Medications    Prior to Admission medications   Medication Sig Start Date End Date Taking? Authorizing Provider  LINZESS 145 MCG CAPS capsule TAKE 1 CAPSULE BY MOUTH 30 MINUTES PRIOR TO YOUR FIRST MEAL. Patient taking differently: Take 145 mcg by mouth daily as needed.  03/14/18  Yes Anice Paganini, NP  norelgestromin-ethinyl estradiol Burr Medico) 150-35 MCG/24HR transdermal patch Place 1 patch onto the skin once a week.   Yes [provider]  pantoprazole (PROTONIX) 20 MG tablet TAKE 1 TABLET BY MOUTH TWICE DAILY 10/15/18  Yes Cyril Mourning A, NP  vitamin B-12 (CYANOCOBALAMIN) 500 MCG tablet Take 500 mcg by mouth daily.   Yes [provider]  Vitamin D, Ergocalciferol, (DRISDOL) 50000 units CAPS capsule Take 1 capsule (50,000 Units total) by mouth every 7 (seven) days. Mondays 07/20/18  Yes Lazaro Arms, MD  predniSONE (DELTASONE) 10 MG tablet Take 4 tablets (  40 mg total) by mouth daily. 12/23/18   Vanetta Mulders, MD  predniSONE (DELTASONE) 20 MG tablet 2 tabs po daily x 3 days 11/09/18   Bethann Berkshire, MD    Family History Family History  Problem Relation Age of Onset  . Kidney disease Mother   . Heart disease Mother   . Hypertension Mother   . Cancer Father        metastatic prostate   . Hypertension Sister     Social History Social History   Tobacco Use  . Smoking status: Never Smoker  . Smokeless tobacco: Never Used  Substance Use Topics  . Alcohol use: No  . Drug use: No     Allergies   Penicillins   Review of Systems Review of Systems  Constitutional: Negative for chills and fever.  HENT: Negative for congestion, rhinorrhea and sore throat.   Eyes: Negative for visual disturbance.  Respiratory: Negative for  cough and shortness of breath.   Cardiovascular: Negative for chest pain and leg swelling.  Gastrointestinal: Negative for abdominal pain, diarrhea, nausea and vomiting.  Genitourinary: Negative for dysuria.  Musculoskeletal: Negative for back pain and neck pain.  Skin: Negative for rash.  Neurological: Positive for numbness. Negative for dizziness, facial asymmetry, speech difficulty, weakness, light-headedness and headaches.  Hematological: Does not bruise/bleed easily.  Psychiatric/Behavioral: Negative for confusion.     Physical Exam Updated Vital Signs BP 135/67 (BP Location: Right Arm)   Pulse 84   Temp 98.3 F (36.8 C) (Oral)   Resp 18   Ht 1.676 m (5\' 6" )   Wt 98.9 kg   SpO2 100%   BMI 35.19 kg/m   Physical Exam Vitals signs and nursing note reviewed.  Constitutional:      General: She is not in acute distress.    Appearance: She is well-developed.  HENT:     Head: Normocephalic and atraumatic.  Eyes:     Extraocular Movements: Extraocular movements intact.     Conjunctiva/sclera: Conjunctivae normal.     Pupils: Pupils are equal, round, and reactive to light.  Neck:     Musculoskeletal: Normal range of motion and neck supple. No neck rigidity.  Cardiovascular:     Rate and Rhythm: Normal rate and regular rhythm.     Heart sounds: No murmur.  Pulmonary:     Effort: Pulmonary effort is normal. No respiratory distress.     Breath sounds: Normal breath sounds.  Abdominal:     General: Bowel sounds are normal.     Palpations: Abdomen is soft.     Tenderness: There is no abdominal tenderness.  Musculoskeletal: Normal range of motion.        General: No swelling.  Skin:    General: Skin is warm and dry.     Capillary Refill: Capillary refill takes less than 2 seconds.  Neurological:     General: No focal deficit present.     Mental Status: She is alert and oriented to person, place, and time.     Cranial Nerves: No cranial nerve deficit.     Sensory: No  sensory deficit.     Motor: No weakness.     Coordination: Coordination normal.      ED Treatments / Results  Labs (all labs ordered are listed, but only abnormal results are displayed) Labs Reviewed - No data to display  EKG None  Radiology No results found.  Procedures Procedures (including critical care time)  Medications Ordered in ED Medications - No data  to display   Initial Impression / Assessment and Plan / ED Course  I have reviewed the triage vital signs and the nursing notes.  Pertinent labs & imaging results that were available during my care of the patient were reviewed by me and considered in my medical decision making (see chart for details).     Patient with recurrent symptoms starting on Monday with right-sided numbness to the right side of the body.  Patient had similar problem the end of December.  At that time had CT head and neck without any acute findings.  Labs were normal then as well.  Patient was referred to neurology.  But she states that local neurology in [place] will not see her.  Patient states the recurrent symptoms are identical.  No weakness.  No other focal deficits.  Do not see an indication for repeating the CT of head or neck.  Actually symptoms do not seem to fit into a cervical radiculopathy.  Patient needs MRI.  Will refer her back to neurology here in [place] as well as also given additional referral to neurology in [place].  Patient states she did get better on short course of steroids before so we will redo that.  Long discussion with patient that MRI is needed to evaluate this process further.  Clinically do not think this is an acute stroke.  Final Clinical Impressions(s) / ED Diagnoses   Final diagnoses:  Right sided numbness    ED Discharge Orders         Ordered    predniSONE (DELTASONE) 10 MG tablet  Daily     12/23/18 1718           Vanetta Mulders, MD 12/23/18 1724

## 2018-12-23 NOTE — ED Triage Notes (Signed)
Intermittent numbness to R side since Monday  Has had this problem before   Pt states [facility] would not see due to no referral from family doctor

## 2018-12-24 ENCOUNTER — Encounter: Payer: Self-pay | Admitting: Neurology

## 2018-12-26 ENCOUNTER — Other Ambulatory Visit: Payer: Self-pay | Admitting: Acute Care

## 2018-12-26 DIAGNOSIS — G35 Multiple sclerosis: Secondary | ICD-10-CM

## 2018-12-28 ENCOUNTER — Ambulatory Visit (HOSPITAL_COMMUNITY)
Admission: RE | Admit: 2018-12-28 | Discharge: 2018-12-28 | Disposition: A | Discharge status: Home / Self Care | Payer: 59 | Source: Ambulatory Visit | Attending: Acute Care | Admitting: Acute Care

## 2018-12-28 DIAGNOSIS — G35 Multiple sclerosis: Secondary | ICD-10-CM | POA: Insufficient documentation

## 2018-12-28 MED ORDER — GADOBUTROL 1 MMOL/ML IV SOLN
10.0000 mL | Freq: Once | INTRAVENOUS | Status: AC | PRN
  Administered 2018-12-28: 10 mL via INTRAVENOUS

## 2019-01-02 ENCOUNTER — Other Ambulatory Visit: Payer: Self-pay | Admitting: Acute Care

## 2019-01-02 ENCOUNTER — Other Ambulatory Visit (HOSPITAL_COMMUNITY): Payer: Self-pay | Admitting: Acute Care

## 2019-01-02 DIAGNOSIS — G935 Compression of brain: Secondary | ICD-10-CM

## 2019-01-07 ENCOUNTER — Other Ambulatory Visit: Payer: Self-pay

## 2019-01-07 ENCOUNTER — Emergency Department (HOSPITAL_COMMUNITY)
Admission: EM | Admit: 2019-01-07 | Discharge: 2019-01-07 | Disposition: A | Discharge status: Home / Self Care | Payer: 59 | Attending: Emergency Medicine | Admitting: Emergency Medicine

## 2019-01-07 ENCOUNTER — Encounter (HOSPITAL_COMMUNITY): Payer: Self-pay | Admitting: Emergency Medicine

## 2019-01-07 ENCOUNTER — Ambulatory Visit (HOSPITAL_COMMUNITY)
Admission: RE | Admit: 2019-01-07 | Discharge: 2019-01-07 | Disposition: A | Discharge status: Home / Self Care | Payer: 59 | Source: Ambulatory Visit | Attending: Acute Care | Admitting: Acute Care

## 2019-01-07 DIAGNOSIS — R42 Dizziness and giddiness: Secondary | ICD-10-CM | POA: Diagnosis not present

## 2019-01-07 DIAGNOSIS — M542 Cervicalgia: Secondary | ICD-10-CM | POA: Diagnosis not present

## 2019-01-07 DIAGNOSIS — R200 Anesthesia of skin: Secondary | ICD-10-CM | POA: Diagnosis not present

## 2019-01-07 DIAGNOSIS — G935 Compression of brain: Secondary | ICD-10-CM

## 2019-01-07 DIAGNOSIS — Z79899 Other long term (current) drug therapy: Secondary | ICD-10-CM | POA: Diagnosis not present

## 2019-01-07 LAB — BASIC METABOLIC PANEL
Anion gap: 8 (ref 5–15)
BUN: 9 mg/dL (ref 6–20)
CHLORIDE: 101 mmol/L (ref 98–111)
CO2: 26 mmol/L (ref 22–32)
Calcium: 8.8 mg/dL — ABNORMAL LOW (ref 8.9–10.3)
Creatinine, Ser: 0.77 mg/dL (ref 0.44–1.00)
GFR calc Af Amer: >60 mL/min (ref >60)
GFR calc non Af Amer: >60 mL/min (ref >60)
Glucose, Bld: 106 mg/dL — ABNORMAL HIGH (ref 70–99)
Potassium: 3.6 mmol/L (ref 3.5–5.1)
Sodium: 135 mmol/L (ref 135–145)

## 2019-01-07 LAB — CBC WITH DIFFERENTIAL/PLATELET
Abs Immature Granulocytes: 0.02 10*3/uL (ref 0.00–0.07)
Basophils Absolute: 0 10*3/uL (ref 0.0–0.1)
Basophils Relative: 0 %
Eosinophils Absolute: 0.1 10*3/uL (ref 0.0–0.5)
Eosinophils Relative: 1 %
HCT: 42.4 % (ref 36.0–46.0)
Hemoglobin: 12.9 g/dL (ref 12.0–15.0)
Immature Granulocytes: 0 %
Lymphocytes Relative: 21 %
Lymphs Abs: 2.4 10*3/uL (ref 0.7–4.0)
MCH: 27 pg (ref 26.0–34.0)
MCHC: 30.4 g/dL (ref 30.0–36.0)
MCV: 88.9 fL (ref 80.0–100.0)
Monocytes Absolute: 1 10*3/uL (ref 0.1–1.0)
Monocytes Relative: 8 %
Neutro Abs: 8.3 10*3/uL — ABNORMAL HIGH (ref 1.7–7.7)
Neutrophils Relative %: 70 %
Platelets: 220 10*3/uL (ref 150–400)
RBC: 4.77 MIL/uL (ref 3.87–5.11)
RDW: 13.4 % (ref 11.5–15.5)
WBC: 11.8 10*3/uL — AB (ref 4.0–10.5)
nRBC: 0 % (ref 0.0–0.2)

## 2019-01-07 LAB — I-STAT BETA HCG BLOOD, ED (MC, WL, AP ONLY)

## 2019-01-07 LAB — MAGNESIUM: Magnesium: 1.9 mg/dL (ref 1.7–2.4)

## 2019-01-07 NOTE — Discharge Instructions (Addendum)
Make an appointment to follow-up with neurosurgery.  Return for worsening symptoms or concerns.

## 2019-01-07 NOTE — ED Provider Notes (Signed)
[facility] EMERGENCY DEPARTMENT Provider Note   CSN: [id number] Arrival date & time: 01/07/19  1604    History   Chief Complaint Chief Complaint  Patient presents with  . Numbness    HPI Mary Moses is a 33 y.o. female.     HPI Patient has had 3 months of right upper extremity and right lower extremity numbness.  Was initially seen in December and had CT head and cervical spine which were normal.  Recently she has been seen by neurology.  Had MRI brain performed 10 days ago and had MRI cervical spine performed today just prior to presentation to the emergency department.  States she has been started on gabapentin by the neurology team and she has had improvement in the numbness of her arm.  She continues to have numbness in her right leg.  States it is not worsened.  She denies any low back pain.  Denies weakness.  No difficulty urinating or incontinence.  No recent fever or chills.  Patient does states she has some lightheadedness especially with standing. Past Medical History:  Diagnosis Date  . Abdominal bloating 09/16/2015  . Abnormal Pap smear   . Cramps, extremity 09/16/2015  . Drug induced amenorrhea 09/16/2015   Has nexplanon  . GERD (gastroesophageal reflux disease)   . Hypokalemia   . Nexplanon in place 09/16/2015  . Nexplanon insertion 07/09/2013   nexplanon inserted 8/26 left arm remove 07/09/16  . Obesity   . Pregnancy   . Swelling 06/19/2013  . Vaginal Pap smear, abnormal     Patient Active Problem List   Diagnosis Date Noted  . Numbness and tingling 11/09/2018  . Gastroesophageal reflux disease 05/10/2017  . History of hypokalemia 05/10/2017  . Cramps, extremity 09/16/2015  . Abdominal bloating 09/16/2015  . Nexplanon in place 09/16/2015  . Drug induced amenorrhea 09/16/2015  . 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 Nexplanon insertion 07/09/2013  . Abnormal Pap smear of cervix 04/01/2013    Past Surgical History:  Procedure Laterality Date  .  CESAREAN SECTION N/A 06/08/2013   Procedure: Primary CESAREAN SECTION of baby girl  at 2056 APGAR 8/9;  Surgeon: Tereso Newcomer, MD;  Location: [facility];  Service: Obstetrics;  Laterality: N/A;     OB History    Gravida  1   Para  1   Term  1   Preterm      AB      Living  1     SAB      TAB      Ectopic      Multiple      Live Births  1            Home Medications    Prior to Admission medications   Medication Sig Start Date End Date Taking? Authorizing Provider  gabapentin (NEURONTIN) 100 MG capsule Take 200 mg by mouth 2 (two) times daily. 01/01/19  Yes [provider]  LINZESS 145 MCG CAPS capsule TAKE 1 CAPSULE BY MOUTH 30 MINUTES PRIOR TO YOUR FIRST MEAL. Patient taking differently: Take 145 mcg by mouth daily as needed.  03/14/18  Yes Anice Paganini, NP  norelgestromin-ethinyl estradiol Burr Medico) 150-35 MCG/24HR transdermal patch Place 1 patch onto the skin once a week.   Yes [provider]  pantoprazole (PROTONIX) 20 MG tablet TAKE 1 TABLET BY MOUTH TWICE DAILY Patient taking differently: Take 20 mg by mouth 2 (two) times daily.  10/15/18  Yes Adline Potter,  NP  vitamin B-12 (CYANOCOBALAMIN) 500 MCG tablet Take 500 mcg by mouth daily.   Yes [provider]  Vitamin D, Ergocalciferol, (DRISDOL) 50000 units CAPS capsule Take 1 capsule (50,000 Units total) by mouth every 7 (seven) days. Mondays Patient taking differently: Take 50,000 Units by mouth every Monday.  07/20/18  Yes Lazaro Arms, MD  predniSONE (DELTASONE) 10 MG tablet Take 4 tablets (40 mg total) by mouth daily. Patient not taking: Reported on 01/07/2019 12/23/18   Vanetta Mulders, MD  predniSONE (DELTASONE) 20 MG tablet 2 tabs po daily x 3 days Patient not taking: Reported on 01/07/2019 11/09/18   Bethann Berkshire, MD    Family History Family History  Problem Relation Age of Onset  . Kidney disease Mother   . Heart disease Mother   . Hypertension Mother   . Cancer  Father        metastatic prostate   . Hypertension Sister     Social History Social History   Tobacco Use  . Smoking status: Never Smoker  . Smokeless tobacco: Never Used  Substance Use Topics  . Alcohol use: No  . Drug use: No     Allergies   Penicillins   Review of Systems Review of Systems  Constitutional: Negative for chills and fever.  HENT: Negative for sore throat and trouble swallowing.   Eyes: Negative for visual disturbance.  Respiratory: Negative for cough and shortness of breath.   Cardiovascular: Negative for chest pain.  Gastrointestinal: Negative for abdominal pain, constipation, diarrhea, nausea and vomiting.  Genitourinary: Negative for difficulty urinating, dysuria, flank pain, frequency and hematuria.  Musculoskeletal: Positive for myalgias and neck pain. Negative for back pain and neck stiffness.  Skin: Negative for rash and wound.  Neurological: Positive for light-headedness and numbness. Negative for syncope, speech difficulty, weakness and headaches.  All other systems reviewed and are negative.    Physical Exam Updated Vital Signs BP 118/62   Pulse 95   Temp 98.1 F (36.7 C) (Oral)   Resp 14   Ht 5\' 6"  (1.676 m)   Wt 98.9 kg   LMP 12/28/2018   SpO2 100%   BMI 35.19 kg/m   Physical Exam Vitals signs and nursing note reviewed.  Constitutional:      General: She is not in acute distress.    Appearance: Normal appearance. She is well-developed. She is not ill-appearing.  HENT:     Head: Normocephalic and atraumatic.     Comments: Cranial nerves II through XII intact.    Nose: Nose normal. No congestion.     Mouth/Throat:     Mouth: Mucous membranes are moist.     Pharynx: No oropharyngeal exudate or posterior oropharyngeal erythema.  Eyes:     Extraocular Movements: Extraocular movements intact.     Pupils: Pupils are equal, round, and reactive to light.  Neck:     Musculoskeletal: Normal range of motion and neck supple. Muscular  tenderness present. No neck rigidity.     Comments: No midline cervical tenderness to palpation.  Patient does have some tenderness over the right trapezius. Cardiovascular:     Rate and Rhythm: Normal rate and regular rhythm.     Heart sounds: No murmur. No friction rub. No gallop.   Pulmonary:     Effort: Pulmonary effort is normal. No respiratory distress.     Breath sounds: Normal breath sounds. No stridor. No wheezing, rhonchi or rales.  Chest:     Chest wall: No tenderness.  Abdominal:  General: Bowel sounds are normal.     Palpations: Abdomen is soft.     Tenderness: There is no abdominal tenderness. There is no right CVA tenderness, left CVA tenderness, guarding or rebound.  Musculoskeletal: Normal range of motion.        General: No swelling, tenderness or deformity.     Right lower leg: No edema.     Left lower leg: No edema.     Comments: 2+ dorsalis pedis and posterior tibial pulses.  2+ radial pulses bilaterally.  No midline thoracic or lumbar tenderness.  No CVA tenderness.  No lower extremity swelling, asymmetry or tenderness.  Lymphadenopathy:     Cervical: No cervical adenopathy.  Skin:    General: Skin is warm and dry.     Capillary Refill: Capillary refill takes less than 2 seconds.     Findings: No erythema or rash.  Neurological:     Mental Status: She is alert and oriented to person, place, and time.     Comments: Decreased sensation to light touch over the dorsum of the right hand and right forearm.  Patient also has decreased sensation to light touch over the right leg.  5/5 plantar and dorsal flexion.  5/5 motor bilateral upper extremities.  Psychiatric:        Mood and Affect: Mood normal.        Behavior: Behavior normal.      ED Treatments / Results  Labs (all labs ordered are listed, but only abnormal results are displayed) Labs Reviewed  CBC WITH DIFFERENTIAL/PLATELET - Abnormal; Notable for the following components:      Result Value   WBC  11.8 (*)    Neutro Abs 8.3 (*)    All other components within normal limits  BASIC METABOLIC PANEL - Abnormal; Notable for the following components:   Glucose, Bld 106 (*)    Calcium 8.8 (*)    All other components within normal limits  MAGNESIUM  I-STAT BETA HCG BLOOD, ED ([facility], [facility], [facility] ONLY)    EKG EKG Interpretation  Date/Time:  Monday January 07 2019 17:57:02 EST Ventricular Rate:  87 PR Interval:    QRS Duration: 81 QT Interval:  355 QTC Calculation: 427 R Axis:   37 Text Interpretation:  Sinus rhythm Confirmed by Loren Racer ([id number]) on 01/07/2019 7:50:19 PM   Radiology Mr Cervical Spine Wo Contrast  Result Date: 01/07/2019 CLINICAL DATA:  Initial evaluation for neck pain and numbness with radiation into the right upper extremity. EXAM: MRI CERVICAL SPINE WITHOUT CONTRAST TECHNIQUE: Multiplanar, multisequence MR imaging of the cervical spine was performed. No intravenous contrast was administered. COMPARISON:  Prior brain MRI from 12/28/2018 FINDINGS: Alignment: Straightening of the normal cervical lordosis. No listhesis. Vertebrae: Vertebral body heights maintained without evidence for acute or chronic fracture. Bone marrow signal intensity within normal limits. No discrete or worrisome osseous lesions. No abnormal marrow edema. Cord: Irregular heterogeneous expansile T2/STIR hyperintense cystic lesion seen extending from the cervicomedullary junction inferiorly through the T1 level, most likely and irregular syrinx. Few scattered internal septations noted. Overall, lesion measures approximately 0.9 x 1.4 x 11.5 cm in greatest dimensions (AP by transverse by craniocaudad). Diameter maximal at the level of C6-7. No visible syrinx within the upper thoracic spinal cord. 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 Paraspinous and prevertebral  soft tissues normal. Normal intravascular flow voids seen within the  vertebral arteries bilaterally. Disc levels: C2-C3: Unremarkable. C3-C4: Small central disc protrusion indents the ventral thecal sac. Slight superior migration. Mild spinal stenosis without frank cord deformity. Foramina are patent. C4-C5:  Unremarkable. C5-C6:  Unremarkable. C6-C7: Tiny central disc protrusion mildly indents the ventral thecal sac. Minimal spinal stenosis without cord impingement. Foramina remain patent. C7-T1:  Unremarkable. Visualized upper thoracic spine demonstrates no significant finding. IMPRESSION: 1. Heterogeneous long segment cystic abnormality extending from the cervicomedullary junction through T1, most compatible with a syrinx. Postcontrast imaging of the cervical spine recommended for complete evaluation. 2. Associated Chiari 1 malformation. 3. Small central disc protrusions at C3-4 and C6-7 with minimal spinal stenosis. Electronically Signed   By: Rise Mu M.D.   On: 01/07/2019 17:56    Procedures Procedures (including critical care time)  Medications Ordered in ED Medications - No data to display   Initial Impression / Assessment and Plan / ED Course  I have reviewed the triage vital signs and the nursing notes.  Pertinent labs & imaging results that were available during my care of the patient were reviewed by me and considered in my medical decision making (see chart for details).       Reviewed results of cervical spine MRI with neurosurgery.  Advised follow-up in the office.  Strict return precautions have been given.   Final Clinical Impressions(s) / ED Diagnoses   Final diagnoses:  Right sided numbness    ED Discharge Orders    None       Loren Racer, MD 01/07/19 1950

## 2019-01-07 NOTE — ED Triage Notes (Signed)
Patient complaining of numbness to right leg and right foot x 2 weeks and dizziness started Friday.

## 2019-01-10 ENCOUNTER — Other Ambulatory Visit: Payer: Self-pay | Admitting: Neurosurgery

## 2019-01-23 ENCOUNTER — Encounter (HOSPITAL_COMMUNITY): Payer: Self-pay | Admitting: *Deleted

## 2019-01-23 ENCOUNTER — Other Ambulatory Visit: Payer: Self-pay

## 2019-01-23 MED ORDER — VANCOMYCIN HCL 10 G IV SOLR
1500.0000 mg | INTRAVENOUS | Status: AC
  Administered 2019-01-24: 1500 mg via INTRAVENOUS
  Filled 2019-01-23: qty 1500

## 2019-01-23 NOTE — Progress Notes (Signed)
Pt denies SOB, chest pain, and being under the care of a cardiologist. Pt denies having a stress test, echo and cardiac cath. Pt denies having a chest x ray within the last year. Pt denies having recent labs.  Pt made aware to stop taking Aspirin, vitamins, fish oil and herbal medications. Do not take any NSAIDs ie: Ibuprofen, Advil, Naproxen (Aleve), Motrin, BC and Goody Powder. Pt verbalized understanding of all pre-op instructions.

## 2019-01-24 ENCOUNTER — Encounter (HOSPITAL_COMMUNITY): Payer: Self-pay | Admitting: *Deleted

## 2019-01-24 ENCOUNTER — Encounter (HOSPITAL_COMMUNITY)
Admission: RE | Admit: 2019-01-24 | Discharge: 2019-01-24 | Disposition: A | Discharge status: Home / Self Care | Payer: 59 | Source: Ambulatory Visit | Attending: Neurosurgery | Admitting: Neurosurgery

## 2019-01-24 ENCOUNTER — Encounter (HOSPITAL_COMMUNITY)
Admission: RE | Disposition: A | Discharge status: Home / Self Care | Payer: Self-pay | Source: Home / Self Care | Attending: Neurosurgery

## 2019-01-24 ENCOUNTER — Inpatient Hospital Stay (HOSPITAL_COMMUNITY): Payer: 59 | Admitting: Certified Registered"

## 2019-01-24 ENCOUNTER — Inpatient Hospital Stay (HOSPITAL_COMMUNITY)
Admission: RE | Admit: 2019-01-24 | Discharge: 2019-01-26 | DRG: 026 | Disposition: A | Discharge status: Home / Self Care | Payer: 59 | Attending: Neurosurgery | Admitting: Neurosurgery

## 2019-01-24 DIAGNOSIS — K219 Gastro-esophageal reflux disease without esophagitis: Secondary | ICD-10-CM | POA: Diagnosis present

## 2019-01-24 DIAGNOSIS — G935 Compression of brain: Secondary | ICD-10-CM | POA: Diagnosis present

## 2019-01-24 DIAGNOSIS — Z841 Family history of disorders of kidney and ureter: Secondary | ICD-10-CM

## 2019-01-24 DIAGNOSIS — Z8249 Family history of ischemic heart disease and other diseases of the circulatory system: Secondary | ICD-10-CM | POA: Diagnosis not present

## 2019-01-24 DIAGNOSIS — G950 Syringomyelia and syringobulbia: Secondary | ICD-10-CM | POA: Diagnosis present

## 2019-01-24 HISTORY — DX: Compression of brain: G93.5

## 2019-01-24 HISTORY — PX: SUBOCCIPITAL CRANIECTOMY CERVICAL LAMINECTOMY: SHX5404

## 2019-01-24 LAB — BASIC METABOLIC PANEL
ANION GAP: 6 (ref 5–15)
BUN: 5 mg/dL — ABNORMAL LOW (ref 6–20)
CO2: 25 mmol/L (ref 22–32)
Calcium: 8.6 mg/dL — ABNORMAL LOW (ref 8.9–10.3)
Chloride: 106 mmol/L (ref 98–111)
Creatinine, Ser: 0.95 mg/dL (ref 0.44–1.00)
GFR calc Af Amer: >60 mL/min (ref >60)
GFR calc non Af Amer: >60 mL/min (ref >60)
Glucose, Bld: 112 mg/dL — ABNORMAL HIGH (ref 70–99)
POTASSIUM: 3.4 mmol/L — AB (ref 3.5–5.1)
Sodium: 137 mmol/L (ref 135–145)

## 2019-01-24 LAB — TYPE AND SCREEN
ABO/RH(D): O POS
Antibody Screen: NEGATIVE

## 2019-01-24 LAB — MRSA PCR SCREENING: MRSA by PCR: NEGATIVE

## 2019-01-24 LAB — CBC
HCT: 41.5 % (ref 36.0–46.0)
Hemoglobin: 13.4 g/dL (ref 12.0–15.0)
MCH: 28.3 pg (ref 26.0–34.0)
MCHC: 32.3 g/dL (ref 30.0–36.0)
MCV: 87.6 fL (ref 80.0–100.0)
NRBC: 0 % (ref 0.0–0.2)
Platelets: 209 10*3/uL (ref 150–400)
RBC: 4.74 MIL/uL (ref 3.87–5.11)
RDW: 13.3 % (ref 11.5–15.5)
WBC: 10.3 10*3/uL (ref 4.0–10.5)

## 2019-01-24 LAB — POCT PREGNANCY, URINE: Preg Test, Ur: NEGATIVE

## 2019-01-24 LAB — ABO/RH: ABO/RH(D): O POS

## 2019-01-24 SURGERY — SUBOCCIPITAL CRANIECTOMY CERVICAL LAMINECTOMY/DURAPLASTY
Anesthesia: General | Site: Head

## 2019-01-24 MED ORDER — PHENYLEPHRINE 40 MCG/ML (10ML) SYRINGE FOR IV PUSH (FOR BLOOD PRESSURE SUPPORT)
PREFILLED_SYRINGE | INTRAVENOUS | Status: AC
  Filled 2019-01-24: qty 10

## 2019-01-24 MED ORDER — MIDAZOLAM HCL 2 MG/2ML IJ SOLN: 0.5000 mg | Freq: Once | INTRAMUSCULAR | Status: DC | PRN

## 2019-01-24 MED ORDER — PROMETHAZINE HCL 25 MG/ML IJ SOLN: 6.2500 mg | INTRAMUSCULAR | Status: DC | PRN

## 2019-01-24 MED ORDER — SODIUM CHLORIDE 0.9 % IV SOLN
INTRAVENOUS | Status: DC
  Administered 2019-01-24 (×3): via INTRAVENOUS

## 2019-01-24 MED ORDER — ONDANSETRON HCL 4 MG PO TABS: 4.0000 mg | ORAL_TABLET | Freq: Four times a day (QID) | ORAL | Status: DC | PRN

## 2019-01-24 MED ORDER — DEXAMETHASONE SODIUM PHOSPHATE 4 MG/ML IJ SOLN
INTRAMUSCULAR | Status: DC | PRN
  Administered 2019-01-24: 10 mg via INTRAVENOUS

## 2019-01-24 MED ORDER — LIDOCAINE 2% (20 MG/ML) 5 ML SYRINGE
INTRAMUSCULAR | Status: DC | PRN
  Administered 2019-01-24: 60 mg via INTRAVENOUS

## 2019-01-24 MED ORDER — PANTOPRAZOLE SODIUM 40 MG IV SOLR: 40.0000 mg | Freq: Every day | INTRAVENOUS | Status: DC

## 2019-01-24 MED ORDER — MIDAZOLAM HCL 2 MG/2ML IJ SOLN
INTRAMUSCULAR | Status: AC
  Filled 2019-01-24: qty 2

## 2019-01-24 MED ORDER — MEPERIDINE HCL 50 MG/ML IJ SOLN: 6.2500 mg | INTRAMUSCULAR | Status: DC | PRN

## 2019-01-24 MED ORDER — HYDROMORPHONE HCL 1 MG/ML IJ SOLN
INTRAMUSCULAR | Status: AC
  Filled 2019-01-24: qty 1

## 2019-01-24 MED ORDER — OXYCODONE HCL 5 MG PO TABS
10.0000 mg | ORAL_TABLET | ORAL | Status: DC | PRN
  Administered 2019-01-24: 10 mg via ORAL
  Filled 2019-01-24: qty 2

## 2019-01-24 MED ORDER — BUPIVACAINE-EPINEPHRINE (PF) 0.25% -1:200000 IJ SOLN
INTRAMUSCULAR | Status: AC
  Filled 2019-01-24: qty 30

## 2019-01-24 MED ORDER — GLYCOPYRROLATE PF 0.2 MG/ML IJ SOSY
PREFILLED_SYRINGE | INTRAMUSCULAR | Status: AC
  Filled 2019-01-24: qty 1

## 2019-01-24 MED ORDER — LIDOCAINE 2% (20 MG/ML) 5 ML SYRINGE
INTRAMUSCULAR | Status: AC
  Filled 2019-01-24: qty 5

## 2019-01-24 MED ORDER — DEXAMETHASONE SODIUM PHOSPHATE 4 MG/ML IJ SOLN: 4.0000 mg | Freq: Four times a day (QID) | INTRAMUSCULAR | Status: AC

## 2019-01-24 MED ORDER — ONDANSETRON HCL 4 MG/2ML IJ SOLN
INTRAMUSCULAR | Status: DC | PRN
  Administered 2019-01-24: 4 mg via INTRAVENOUS

## 2019-01-24 MED ORDER — PANTOPRAZOLE SODIUM 20 MG PO TBEC: 20.0000 mg | DELAYED_RELEASE_TABLET | Freq: Two times a day (BID) | ORAL | Status: DC

## 2019-01-24 MED ORDER — OXYCODONE HCL 5 MG PO TABS
5.0000 mg | ORAL_TABLET | ORAL | Status: DC | PRN
  Administered 2019-01-26 (×2): 5 mg via ORAL
  Filled 2019-01-24 (×2): qty 1

## 2019-01-24 MED ORDER — ONDANSETRON HCL 4 MG/2ML IJ SOLN
INTRAMUSCULAR | Status: AC
  Filled 2019-01-24: qty 2

## 2019-01-24 MED ORDER — PHENOL 1.4 % MT LIQD: 1.0000 | OROMUCOSAL | Status: DC | PRN

## 2019-01-24 MED ORDER — ROCURONIUM BROMIDE 50 MG/5ML IV SOSY
PREFILLED_SYRINGE | INTRAVENOUS | Status: AC
  Filled 2019-01-24: qty 5

## 2019-01-24 MED ORDER — MORPHINE SULFATE (PF) 4 MG/ML IV SOLN: 4.0000 mg | INTRAVENOUS | Status: DC | PRN

## 2019-01-24 MED ORDER — ALUM & MAG HYDROXIDE-SIMETH 200-200-20 MG/5ML PO SUSP: 30.0000 mL | Freq: Four times a day (QID) | ORAL | Status: DC | PRN

## 2019-01-24 MED ORDER — ONDANSETRON HCL 4 MG/2ML IJ SOLN
4.0000 mg | Freq: Four times a day (QID) | INTRAMUSCULAR | Status: DC | PRN
  Administered 2019-01-24: 4 mg via INTRAVENOUS
  Filled 2019-01-24: qty 2

## 2019-01-24 MED ORDER — GLYCOPYRROLATE PF 0.2 MG/ML IJ SOSY
PREFILLED_SYRINGE | INTRAMUSCULAR | Status: DC | PRN
  Administered 2019-01-24: .1 mg via INTRAVENOUS

## 2019-01-24 MED ORDER — CHLORHEXIDINE GLUCONATE CLOTH 2 % EX PADS: 6.0000 | MEDICATED_PAD | Freq: Once | CUTANEOUS | Status: DC

## 2019-01-24 MED ORDER — 0.9 % SODIUM CHLORIDE (POUR BTL) OPTIME
TOPICAL | Status: DC | PRN
  Administered 2019-01-24 (×2): 1000 mL

## 2019-01-24 MED ORDER — DEXAMETHASONE SODIUM PHOSPHATE 10 MG/ML IJ SOLN
INTRAMUSCULAR | Status: AC
  Filled 2019-01-24: qty 1

## 2019-01-24 MED ORDER — MIDAZOLAM HCL 5 MG/5ML IJ SOLN
INTRAMUSCULAR | Status: DC | PRN
  Administered 2019-01-24: 2 mg via INTRAVENOUS

## 2019-01-24 MED ORDER — PROPOFOL 10 MG/ML IV BOLUS
INTRAVENOUS | Status: DC | PRN
  Administered 2019-01-24: 50 mg via INTRAVENOUS
  Administered 2019-01-24: 200 mg via INTRAVENOUS

## 2019-01-24 MED ORDER — THROMBIN 5000 UNITS EX SOLR
CUTANEOUS | Status: AC
  Filled 2019-01-24: qty 5000

## 2019-01-24 MED ORDER — FENTANYL CITRATE (PF) 100 MCG/2ML IJ SOLN
INTRAMUSCULAR | Status: DC | PRN
  Administered 2019-01-24: 100 ug via INTRAVENOUS
  Administered 2019-01-24 (×3): 50 ug via INTRAVENOUS
  Administered 2019-01-24: 100 ug via INTRAVENOUS
  Administered 2019-01-24: 50 ug via INTRAVENOUS

## 2019-01-24 MED ORDER — BISACODYL 10 MG RE SUPP: 10.0000 mg | Freq: Every day | RECTAL | Status: DC | PRN

## 2019-01-24 MED ORDER — HEMOSTATIC AGENTS (NO CHARGE) OPTIME
TOPICAL | Status: DC | PRN
  Administered 2019-01-24: 1

## 2019-01-24 MED ORDER — ZOLPIDEM TARTRATE 5 MG PO TABS: 5.0000 mg | ORAL_TABLET | Freq: Every evening | ORAL | Status: DC | PRN

## 2019-01-24 MED ORDER — WHITE PETROLATUM EX OINT
TOPICAL_OINTMENT | CUTANEOUS | Status: AC
  Administered 2019-01-24: 17:00:00
  Filled 2019-01-24: qty 28.35

## 2019-01-24 MED ORDER — BACITRACIN ZINC 500 UNIT/GM EX OINT
TOPICAL_OINTMENT | CUTANEOUS | Status: DC | PRN
  Administered 2019-01-24: 1 via TOPICAL

## 2019-01-24 MED ORDER — THROMBIN 20000 UNITS EX SOLR
CUTANEOUS | Status: AC
  Filled 2019-01-24: qty 20000

## 2019-01-24 MED ORDER — PROPOFOL 10 MG/ML IV BOLUS
INTRAVENOUS | Status: AC
  Filled 2019-01-24: qty 20

## 2019-01-24 MED ORDER — ACETAMINOPHEN 500 MG PO TABS
1000.0000 mg | ORAL_TABLET | Freq: Four times a day (QID) | ORAL | Status: AC
  Administered 2019-01-24 – 2019-01-25 (×3): 1000 mg via ORAL
  Filled 2019-01-24 (×4): qty 2

## 2019-01-24 MED ORDER — ACETAMINOPHEN 325 MG PO TABS
650.0000 mg | ORAL_TABLET | ORAL | Status: DC | PRN
  Administered 2019-01-25 (×2): 650 mg via ORAL
  Filled 2019-01-24: qty 2

## 2019-01-24 MED ORDER — CYCLOBENZAPRINE HCL 10 MG PO TABS
10.0000 mg | ORAL_TABLET | Freq: Three times a day (TID) | ORAL | Status: DC | PRN
  Administered 2019-01-24: 10 mg via ORAL
  Filled 2019-01-24: qty 1

## 2019-01-24 MED ORDER — MENTHOL 3 MG MT LOZG: 1.0000 | LOZENGE | OROMUCOSAL | Status: DC | PRN

## 2019-01-24 MED ORDER — DEXAMETHASONE 4 MG PO TABS
4.0000 mg | ORAL_TABLET | Freq: Four times a day (QID) | ORAL | Status: AC
  Administered 2019-01-24 (×2): 4 mg via ORAL
  Filled 2019-01-24 (×2): qty 1

## 2019-01-24 MED ORDER — SODIUM CHLORIDE 0.9 % IV SOLN
INTRAVENOUS | Status: DC | PRN
  Administered 2019-01-24: 500 mL

## 2019-01-24 MED ORDER — HYDROMORPHONE HCL 1 MG/ML IJ SOLN
0.2500 mg | INTRAMUSCULAR | Status: DC | PRN
  Administered 2019-01-24 (×2): 0.5 mg via INTRAVENOUS

## 2019-01-24 MED ORDER — SUGAMMADEX SODIUM 200 MG/2ML IV SOLN
INTRAVENOUS | Status: DC | PRN
  Administered 2019-01-24: 350 mg via INTRAVENOUS

## 2019-01-24 MED ORDER — ROCURONIUM BROMIDE 50 MG/5ML IV SOSY
PREFILLED_SYRINGE | INTRAVENOUS | Status: DC | PRN
  Administered 2019-01-24 (×3): 10 mg via INTRAVENOUS
  Administered 2019-01-24: 50 mg via INTRAVENOUS

## 2019-01-24 MED ORDER — NORELGESTROMIN-ETH ESTRADIOL 150-35 MCG/24HR TD PTWK: 1.0000 | MEDICATED_PATCH | TRANSDERMAL | Status: DC

## 2019-01-24 MED ORDER — DOCUSATE SODIUM 100 MG PO CAPS
100.0000 mg | ORAL_CAPSULE | Freq: Two times a day (BID) | ORAL | Status: DC
  Administered 2019-01-24 – 2019-01-26 (×3): 100 mg via ORAL
  Filled 2019-01-24 (×4): qty 1

## 2019-01-24 MED ORDER — ACETAMINOPHEN 650 MG RE SUPP: 650.0000 mg | RECTAL | Status: DC | PRN

## 2019-01-24 MED ORDER — LACTATED RINGERS IV SOLN: INTRAVENOUS | Status: DC

## 2019-01-24 MED ORDER — LABETALOL HCL 5 MG/ML IV SOLN
10.0000 mg | INTRAVENOUS | Status: DC | PRN
  Administered 2019-01-24 (×2): 10 mg via INTRAVENOUS
  Filled 2019-01-24: qty 4

## 2019-01-24 MED ORDER — VANCOMYCIN HCL IN DEXTROSE 1-5 GM/200ML-% IV SOLN
1000.0000 mg | Freq: Once | INTRAVENOUS | Status: AC
  Administered 2019-01-24: 1000 mg via INTRAVENOUS
  Filled 2019-01-24: qty 200

## 2019-01-24 MED ORDER — BACITRACIN ZINC 500 UNIT/GM EX OINT
TOPICAL_OINTMENT | CUTANEOUS | Status: AC
  Filled 2019-01-24: qty 28.35

## 2019-01-24 MED ORDER — THROMBIN 5000 UNITS EX SOLR
OROMUCOSAL | Status: DC | PRN
  Administered 2019-01-24: 5 mL via TOPICAL

## 2019-01-24 MED ORDER — PANTOPRAZOLE SODIUM 20 MG PO TBEC
20.0000 mg | DELAYED_RELEASE_TABLET | Freq: Two times a day (BID) | ORAL | Status: DC
  Administered 2019-01-24 – 2019-01-26 (×4): 20 mg via ORAL
  Filled 2019-01-24 (×4): qty 1

## 2019-01-24 MED ORDER — LIDOCAINE-EPINEPHRINE 1 %-1:100000 IJ SOLN
INTRAMUSCULAR | Status: DC | PRN
  Administered 2019-01-24: 10 mL

## 2019-01-24 MED ORDER — FENTANYL CITRATE (PF) 250 MCG/5ML IJ SOLN
INTRAMUSCULAR | Status: AC
  Filled 2019-01-24: qty 5

## 2019-01-24 SURGICAL SUPPLY — 76 items
BAG DECANTER FOR FLEXI CONT (MISCELLANEOUS) ×3 IMPLANT
BENZOIN TINCTURE PRP APPL 2/3 (GAUZE/BANDAGES/DRESSINGS) ×3 IMPLANT
BLADE CLIPPER SPEC (BLADE) ×3 IMPLANT
BLADE ULTRA TIP 2M (BLADE) IMPLANT
BUR MATCHSTICK NEURO 3.0 LAGG (BURR) ×3 IMPLANT
BUR PRECISION FLUTE 6.0 (BURR) ×3 IMPLANT
CANISTER SUCT 3000ML PPV (MISCELLANEOUS) ×3 IMPLANT
CARTRIDGE OIL MAESTRO DRILL (MISCELLANEOUS) ×1 IMPLANT
CLIP VESOCCLUDE MED 6/CT (CLIP) IMPLANT
CLOSURE STERI-STRIP 1/2X4 (GAUZE/BANDAGES/DRESSINGS) ×1
CLSR STERI-STRIP ANTIMIC 1/2X4 (GAUZE/BANDAGES/DRESSINGS) ×2 IMPLANT
COVER MAYO STAND STRL (DRAPES) ×3 IMPLANT
COVER WAND RF STERILE (DRAPES) ×3 IMPLANT
DERMABOND ADVANCED (GAUZE/BANDAGES/DRESSINGS) ×2
DERMABOND ADVANCED .7 DNX12 (GAUZE/BANDAGES/DRESSINGS) ×1 IMPLANT
DIFFUSER DRILL AIR PNEUMATIC (MISCELLANEOUS) ×3 IMPLANT
DRAPE LAPAROTOMY 100X72 PEDS (DRAPES) ×3 IMPLANT
DRAPE MICROSCOPE LEICA (MISCELLANEOUS) ×3 IMPLANT
DRAPE SURG 17X23 STRL (DRAPES) ×6 IMPLANT
DRAPE WARM FLUID 44X44 (DRAPE) ×3 IMPLANT
DRSG OPSITE POSTOP 4X6 (GAUZE/BANDAGES/DRESSINGS) ×3 IMPLANT
DURAGUARD 04CMX04CM ×3 IMPLANT
ELECT BLADE 4.0 EZ CLEAN MEGAD (MISCELLANEOUS) ×3
ELECT REM PT RETURN 9FT ADLT (ELECTROSURGICAL) ×3
ELECTRODE BLDE 4.0 EZ CLN MEGD (MISCELLANEOUS) ×1 IMPLANT
ELECTRODE REM PT RTRN 9FT ADLT (ELECTROSURGICAL) ×1 IMPLANT
EVACUATOR 1/8 PVC DRAIN (DRAIN) IMPLANT
EVACUATOR SILICONE 100CC (DRAIN) IMPLANT
GAUZE 4X4 16PLY RFD (DISPOSABLE) IMPLANT
GAUZE SPONGE 4X4 12PLY STRL (GAUZE/BANDAGES/DRESSINGS) IMPLANT
GLOVE BIO SURGEON STRL SZ 6.5 (GLOVE) ×4 IMPLANT
GLOVE BIO SURGEON STRL SZ8 (GLOVE) ×3 IMPLANT
GLOVE BIO SURGEON STRL SZ8.5 (GLOVE) ×3 IMPLANT
GLOVE BIO SURGEONS STRL SZ 6.5 (GLOVE) ×2
GLOVE BIOGEL PI IND STRL 6.5 (GLOVE) ×2 IMPLANT
GLOVE BIOGEL PI IND STRL 7.0 (GLOVE) ×2 IMPLANT
GLOVE BIOGEL PI IND STRL 7.5 (GLOVE) ×2 IMPLANT
GLOVE BIOGEL PI INDICATOR 6.5 (GLOVE) ×4
GLOVE BIOGEL PI INDICATOR 7.0 (GLOVE) ×4
GLOVE BIOGEL PI INDICATOR 7.5 (GLOVE) ×4
GLOVE EXAM NITRILE XL STR (GLOVE) IMPLANT
GLOVE SURG SS PI 6.0 STRL IVOR (GLOVE) ×6 IMPLANT
GLOVE SURG SS PI 7.5 STRL IVOR (GLOVE) ×12 IMPLANT
GOWN STRL REUS W/ TWL LRG LVL3 (GOWN DISPOSABLE) ×4 IMPLANT
GOWN STRL REUS W/ TWL XL LVL3 (GOWN DISPOSABLE) ×1 IMPLANT
GOWN STRL REUS W/TWL LRG LVL3 (GOWN DISPOSABLE) ×8
GOWN STRL REUS W/TWL XL LVL3 (GOWN DISPOSABLE) ×2
KIT BASIN OR (CUSTOM PROCEDURE TRAY) ×3 IMPLANT
KIT TURNOVER KIT B (KITS) ×3 IMPLANT
MARKER SKIN DUAL TIP RULER LAB (MISCELLANEOUS) ×3 IMPLANT
NEEDLE HYPO 22GX1.5 SAFETY (NEEDLE) ×3 IMPLANT
NS IRRIG 1000ML POUR BTL (IV SOLUTION) ×6 IMPLANT
OIL CARTRIDGE MAESTRO DRILL (MISCELLANEOUS) ×3
PACK CRANIOTOMY CUSTOM (CUSTOM PROCEDURE TRAY) ×3 IMPLANT
PAD ARMBOARD 7.5X6 YLW CONV (MISCELLANEOUS) ×9 IMPLANT
PATTIES SURGICAL 1/4 X 3 (GAUZE/BANDAGES/DRESSINGS) IMPLANT
PIN MAYFIELD SKULL DISP (PIN) ×3 IMPLANT
RUBBERBAND STERILE (MISCELLANEOUS) ×6 IMPLANT
SEALANT ADHERUS EXTEND TIP (MISCELLANEOUS) ×3 IMPLANT
SPONGE LAP 4X18 RFD (DISPOSABLE) IMPLANT
SPONGE SURGIFOAM ABS GEL 100 (HEMOSTASIS) ×3 IMPLANT
STAPLER SKIN PROX WIDE 3.9 (STAPLE) ×3 IMPLANT
SUT ETHILON 2 0 FS 18 (SUTURE) IMPLANT
SUT ETHILON 2 0 PSLX (SUTURE) ×3 IMPLANT
SUT ETHILON 3 0 FSL (SUTURE) ×3 IMPLANT
SUT NURALON 4 0 TR CR/8 (SUTURE) ×3 IMPLANT
SUT PROLENE 6 0 BV (SUTURE) ×18 IMPLANT
SUT VIC AB 1 CT1 18XBRD ANBCTR (SUTURE) ×1 IMPLANT
SUT VIC AB 1 CT1 8-18 (SUTURE) ×2
SUT VIC AB 2-0 CP2 18 (SUTURE) ×3 IMPLANT
SUT VIC AB 3-0 SH 8-18 (SUTURE) ×3 IMPLANT
TOWEL GREEN STERILE (TOWEL DISPOSABLE) ×3 IMPLANT
TOWEL GREEN STERILE FF (TOWEL DISPOSABLE) ×3 IMPLANT
TRAY FOLEY MTR SLVR 16FR STAT (SET/KITS/TRAYS/PACK) ×3 IMPLANT
UNDERPAD 30X30 (UNDERPADS AND DIAPERS) IMPLANT
WATER STERILE IRR 1000ML POUR (IV SOLUTION) ×3 IMPLANT

## 2019-01-24 NOTE — Transfer of Care (Signed)
Immediate Anesthesia Transfer of Care Note  Patient: Mary Moses  Procedure(s) Performed: Suboccipital craniectomy/cervical laminectomy/duraplasty (Head)  Patient Location: PACU  Anesthesia Type:General  Level of Consciousness: drowsy and patient cooperative  Airway & Oxygen Therapy: Patient Spontanous Breathing and Patient connected to face mask oxygen  Post-op Assessment: Report given to RN, Post -op Vital signs reviewed and stable and Patient moving all extremities X 4  Post vital signs: Reviewed and stable  Last Vitals:  Vitals Value Taken Time  BP 138/65 01/24/2019  2:30 PM  Temp 36.6 C 01/24/2019  2:30 PM  Pulse 118 01/24/2019  2:32 PM  Resp 12 01/24/2019  2:32 PM  SpO2 100 % 01/24/2019  2:32 PM  Vitals shown include unvalidated device data.  Last Pain:  Vitals:   01/24/19 1038  TempSrc: Oral  PainSc:       Patients Stated Pain Goal: 2 (01/24/19 1033)  Complications: No apparent anesthesia complications

## 2019-01-24 NOTE — Anesthesia Procedure Notes (Signed)
Arterial Line Insertion Start/End3/10/2019 10:44 AM, 01/24/2019 10:48 AM Performed by: Adair Laundry, CRNA, CRNA  Patient location: Pre-op. Lidocaine 1% used for infiltration and patient sedated Left, radial was placed Catheter size: 20 G Hand hygiene performed  and maximum sterile barriers used   Attempts: 2 Procedure performed without using ultrasound guided technique. Following insertion, dressing applied and Biopatch. Post procedure assessment: normal  Patient tolerated the procedure well with no immediate complications.

## 2019-01-24 NOTE — Progress Notes (Signed)
Subjective: The patient is alert and pleasant.  She is in no apparent distress.  She looks well.  Objective: Vital signs in last 24 hours: Temp:  [97.9 F (36.6 C)] 97.9 F (36.6 C) (03/12 1430) Pulse Rate:  [120-123] 123 (03/12 1430) Resp:  [19-20] 19 (03/12 1430) BP: (138-150)/(65-81) 138/65 (03/12 1430) SpO2:  [99 %-100 %] 99 % (03/12 1430) Arterial Line BP: (151)/(80) 151/80 (03/12 1430) Weight:  [98.9 kg] 98.9 kg (03/12 1033) Estimated body mass index is 35.19 kg/m as calculated from the following:   Height as of this encounter: 5\' 6"  (1.676 m).   Weight as of this encounter: 98.9 kg.   Intake/Output from previous day: No intake/output data recorded. Intake/Output this shift: Total I/O In: 1600 [I.V.:1600] Out: 960 [Urine:810; Blood:150]  Physical exam the patient is alert and pleasant.  She is moving all 4 extremities well.  Lab Results: Recent Labs    01/24/19 1039  WBC 10.3  HGB 13.4  HCT 41.5  PLT 209   BMET Recent Labs    01/24/19 1039  NA 137  K 3.4*  CL 106  CO2 25  GLUCOSE 112*  BUN 5*  CREATININE 0.95  CALCIUM 8.6*    Studies/Results: No results found.  Assessment/Plan: The patient is doing well.  LOS: 0 days     Cristi Loron 01/24/2019, 2:38 PM

## 2019-01-24 NOTE — Op Note (Signed)
Brief history: The patient is a 33 year old black female who is complained of numbness and weakness.  She was worked up with a brain MRI  and a cervical MRI which demonstrated a Chiari I malformation and syringomyelia.  I discussed the various treatment options with her including surgery.  She has weighed the risks, benefits, and alternatives surgery and decided proceed with a suboccipital craniectomy, cervical laminectomy and duraplasty.  Preop diagnosis: Chiari I malformation, syringomyelia  Postop diagnosis: The same  Procedure: Suboccipital craniectomy, C1 and C2 laminectomy, duraplasty using microdissection  Surgeon: Dr. Delma Officer  Assistant: Hildred Priest nurse practitioner  Anesthesia: General tracheal  Estimated blood loss: 125 cc  Specimens: None  Drains: None  Complications: None  Description of procedure: The patient was brought to the operating room by the anesthesia team.  General endotracheal anesthesia was induced.  I applied the Mayfield three-point headrest to the patient's calvarium.  She was then turned to the prone position on the chest rolls.  Her suboccipital region was then shaved with clippers in this region as well as the posterior neck and upper thorax were prepared with Betadine scrub and Betadine solution.  Sterile drapes were applied.  I then injected the area to be incised with lidocaine with epinephrine solution.  I used a scalpel to make a linear midline incision at the occipital cervical junction.  I used the electrocautery to perform a bilateral subperiosteal dissection exposing the suboccipital bone, foramen magnum, C1 and C2 lamina.  I used the cerebellar retractor for exposure.  I then used the high-speed drill to thin the patient's suboccipital bone foramen magnum and the C1 and upper C2 lamina.  I completed the upper C2, C1, and suboccipital craniectomy with a Kerrison punches.  I then incised the dura in a Y-shaped fashion centered at the foramen  magnum.  We tacked up the dural edges.  The arachnoid was fairly intact.  We could see the cerebellar tonsils through the arachnoid.  The cerebellar tonsils had herniated through the foramen magnum.  We continued the duraplasty distal to the caudal edge of the cerebellar tonsillar herniation.  We brought the operative microscope into the field.  We then performed a duraplasty with Dura-Guard 6-0 Prolene sutures.  After I completed duraplasty we had anesthesia Valsalva the patient.  The closure appeared fairly watertight.  We obtained him stasis with bipolar cautery.  We irrigated the wound that with bacitracin solution.  I then placed FloSeal over the duraplasty.  We then removed the retractor.  We reapproximated the septal cervical fascia with interrupted 0 Vicryl suture.  We reapproximated the subcutaneous tissue with interrupted 2-0 Vicryl suture.  We reapproximate the skin with a running 2-0 nylon suture.  The wound was then coated with bacitracin ointment.  A sterile dressing was applied.  The drapes were removed.  The patient was then returned to the supine position.  I then removed the Mayfield three-point headrest from the patient's calvarium.  By report all sponge, instrument, and needle counts were correct at the end this case.

## 2019-01-24 NOTE — Anesthesia Postprocedure Evaluation (Signed)
Anesthesia Post Note  Patient: Mary Moses  Procedure(s) Performed: Suboccipital craniectomy/cervical laminectomy/duraplasty (Head)     Patient location during evaluation: PACU Anesthesia Type: General Level of consciousness: sedated, oriented and patient cooperative Pain management: pain level controlled Vital Signs Assessment: post-procedure vital signs reviewed and stable Respiratory status: spontaneous breathing, nonlabored ventilation, respiratory function stable and patient connected to nasal cannula oxygen Cardiovascular status: blood pressure returned to baseline and stable Postop Assessment: no apparent nausea or vomiting Anesthetic complications: no    Last Vitals:  Vitals:   01/24/19 1615 01/24/19 1630  BP: 135/80 133/82  Pulse:    Resp: 17 15  Temp:    SpO2: 95%     Last Pain:  Vitals:   01/24/19 1529  TempSrc:   PainSc: Asleep                 Edynn Gillock,E. Lasean Gorniak

## 2019-01-24 NOTE — Progress Notes (Addendum)
Pharmacy Antibiotic Note  Mary Moses is a 33 y.o. female admitted on 01/24/2019 with s/p craniectomy, C1 / C2 laminectomy and microdissection on 01/24/19. Pharmacy has been consulted for vancomycin dosing for surgical prophylaxis.  Patient does not have a drain.  Noted patient received pre-op vancomycin 1500mg  IV around 1030 today.    SCr 0.95, CrCL 99 ml/min, afebrile, WBC WNL.   Plan: Vanc 1gm IV x 1 at 2230 Pharmacy will sign off  Height: 5\' 6"  (167.6 cm) Weight: 218 lb 0.6 oz (98.9 kg) IBW/kg (Calculated) : 59.3  Temp (24hrs), Avg:97.9 F (36.6 C), Min:97.9 F (36.6 C), Max:97.9 F (36.6 C)  Recent Labs  Lab 01/24/19 1039  WBC 10.3  CREATININE 0.95    Estimated Creatinine Clearance: 98.9 mL/min (by C-G formula based on SCr of 0.95 mg/dL).    Allergies  Allergen Reactions  . Penicillins Nausea And Vomiting    Has patient had a PCN reaction causing immediate rash, facial/tongue/throat swelling, SOB or lightheadedness with hypotension: no Has patient had a PCN reaction causing severe rash involving mucus membranes or skin necrosis: no Has patient had a PCN reaction that required hospitalization no Has patient had a PCN reaction occurring within the last 10 years: no If all of the above answers are "NO", then may proceed with Cephalosporin   . Tramadol Hcl Nausea And Vomiting    D'Arcy Abraha D. Laney Potash, PharmD, BCPS, BCCCP 01/24/2019, 4:13 PM

## 2019-01-24 NOTE — Progress Notes (Signed)
Spoke with Brooks, PA about pt's BP. Running 165-175 SBP from a-line with good wave form. Cuff pressure 130-140's SBP.   Gave new orders for PRN Labetalol and goals to keep SBP under 160. Will continue to monitor.   Sherrie George, RN BSN

## 2019-01-24 NOTE — H&P (Signed)
Subjective: The patient is a 33 year old black female who has complained of neck pain, numbness, tingling and weakness.  She was worked up with a which was normal and a cervical MRI which demonstrated a Chiari I malformation with syringomyelia.  I discussed the various treatment options with her.  She has decided to proceed with surgery.  Past Medical History:  Diagnosis Date  . Abdominal bloating 09/16/2015  . Abnormal Pap smear   . Chiari I malformation (HCC)   . Cramps, extremity 09/16/2015  . Drug induced amenorrhea 09/16/2015   Has nexplanon  . GERD (gastroesophageal reflux disease)   . Hypokalemia   . Nexplanon in place 09/16/2015  . Nexplanon insertion 07/09/2013   nexplanon inserted 8/26 left arm remove 07/09/16  . Obesity   . Pregnancy   . Swelling 06/19/2013  . Vaginal Pap smear, abnormal     Past Surgical History:  Procedure Laterality Date  . CESAREAN SECTION N/A 06/08/2013   Procedure: Primary CESAREAN SECTION of baby girl  at 2056 APGAR 8/9;  Surgeon: Tereso Newcomer, MD;  Location: [facility];  Service: Obstetrics;  Laterality: N/A;    Allergies  Allergen Reactions  . Penicillins Nausea And Vomiting    Has patient had a PCN reaction causing immediate rash, facial/tongue/throat swelling, SOB or lightheadedness with hypotension: no Has patient had a PCN reaction causing severe rash involving mucus membranes or skin necrosis: no Has patient had a PCN reaction that required hospitalization no Has patient had a PCN reaction occurring within the last 10 years: no If all of the above answers are "NO", then may proceed with Cephalosporin   . Tramadol Hcl Nausea And Vomiting    Social History   Tobacco Use  . Smoking status: Never Smoker  . Smokeless tobacco: Never Used  Substance Use Topics  . Alcohol use: No    Family History  Problem Relation Age of Onset  . Kidney disease Mother   . Heart disease Mother   . Hypertension Mother   . Cancer Father        metastatic  prostate   . Hypertension Sister    Prior to Admission medications   Medication Sig Start Date End Date Taking? Authorizing Provider  cyclobenzaprine (FLEXERIL) 10 MG tablet Take 10 mg by mouth 3 (three) times daily as needed for muscle spasms.   Yes [provider]  LINZESS 145 MCG CAPS capsule TAKE 1 CAPSULE BY MOUTH 30 MINUTES PRIOR TO YOUR FIRST MEAL. Patient taking differently: Take 145 mcg by mouth daily as needed (constipation).  03/14/18  Yes Anice Paganini, NP  norelgestromin-ethinyl estradiol [facility]) 150-35 MCG/24HR transdermal patch Place 1 patch onto the skin once a week.   Yes [provider]  pantoprazole (PROTONIX) 20 MG tablet TAKE 1 TABLET BY MOUTH TWICE DAILY Patient taking differently: Take 20 mg by mouth 2 (two) times daily.  10/15/18  Yes Adline Potter, NP  vitamin B-12 (CYANOCOBALAMIN) 500 MCG tablet Take 500 mcg by mouth daily.   Yes [provider]  Vitamin D, Ergocalciferol, (DRISDOL) 50000 units CAPS capsule Take 1 capsule (50,000 Units total) by mouth every 7 (seven) days. Mondays Patient taking differently: Take 50,000 Units by mouth every Monday.  07/20/18  Yes Lazaro Arms, MD  predniSONE (DELTASONE) 10 MG tablet Take 4 tablets (40 mg total) by mouth daily. Patient not taking: Reported on 01/07/2019 12/23/18   Vanetta Mulders, MD  predniSONE (DELTASONE) 20 MG tablet 2 tabs po daily x 3 days  Patient not taking: Reported on 01/07/2019 11/09/18   Bethann Berkshire, MD     Review of Systems  Positive ROS: As above  All other systems have been reviewed and were otherwise negative with the exception of those mentioned in the HPI and as above.  Objective: Vital signs in last 24 hours: Weight:  [98.9 kg] 98.9 kg (03/11 1103) Estimated body mass index is 35.19 kg/m as calculated from the following:   Height as of this encounter: 5\' 6"  (1.676 m).   Weight as of this encounter: 98.9 kg.   General Appearance: Alert Head: Normocephalic,  without obvious abnormality, atraumatic Eyes: PERRL, conjunctiva/corneas clear, EOM's intact,    Ears: Normal  Throat: Normal  Neck: Supple, Back: unremarkable Lungs: Clear to auscultation bilaterally, respirations unlabored Heart: Regular rate and rhythm, no murmur, rub or gallop Abdomen: Soft, non-tender Extremities: Extremities normal, atraumatic, no cyanosis or edema Skin: unremarkable  NEUROLOGIC:   Mental status: alert and oriented,Motor Exam -she has some hand weakness. Sensory Exam -she has hand numbness Reflexes:  Coordination - grossly normal Gait - grossly normal Balance - grossly normal Cranial Nerves: I: smell Not tested  II: visual acuity  OS: Normal  OD: Normal   II: visual fields Full to confrontation  II: pupils Equal, round, reactive to light  III,VII: ptosis None  III,IV,VI: extraocular muscles  Full ROM  V: mastication Normal  V: facial light touch sensation  Normal  V,VII: corneal reflex  Present  VII: facial muscle function - upper  Normal  VII: facial muscle function - lower Normal  VIII: hearing Not tested  IX: soft palate elevation  Normal  IX,X: gag reflex Present  XI: trapezius strength  5/5  XI: sternocleidomastoid strength 5/5  XI: neck flexion strength  5/5  XII: tongue strength  Normal    Data Review Lab Results  Component Value Date   WBC 11.8 (H) 01/07/2019   HGB 12.9 01/07/2019   HCT 42.4 01/07/2019   MCV 88.9 01/07/2019   PLT 220 01/07/2019   Lab Results  Component Value Date   NA 135 01/07/2019   K 3.6 01/07/2019   CL 101 01/07/2019   CO2 26 01/07/2019   BUN 9 01/07/2019   CREATININE 0.77 01/07/2019   GLUCOSE 106 (H) 01/07/2019   No results found for: INR, PROTIME  Assessment/Plan: Chiari I malformation, syringomyelia: I have discussed the situation with the patient.  I reviewed her imaging studies with her and pointed out the abnormalities.  We have discussed the various treatment options including surgery.  I have  described the surgical treatment option of a suboccipital craniectomy, cervical laminectomy and duraplasty.  I have shown her surgical models.  We have discussed the risks, benefits, alternatives, expected postoperative course, and likelihood of achieving our goals with surgery.  I have answered all her questions.  She has decided to proceed with surgery.   Cristi Loron 01/24/2019 10:16 AM

## 2019-01-24 NOTE — Anesthesia Procedure Notes (Addendum)
Procedure Name: Intubation Date/Time: 01/24/2019 11:04 AM Performed by: Julian Reil, CRNA Pre-anesthesia Checklist: Patient identified, Emergency Drugs available, Suction available and Patient being monitored Patient Re-evaluated:Patient Re-evaluated prior to induction Oxygen Delivery Method: Circle system utilized Preoxygenation: Pre-oxygenation with 100% oxygen Induction Type: IV induction Ventilation: Mask ventilation without difficulty Laryngoscope Size: Glidescope and 4 Grade View: Grade I Tube type: Oral Tube size: 7.0 mm Number of attempts: 1 Airway Equipment and Method: Stylet and Video-laryngoscopy Placement Confirmation: ETT inserted through vocal cords under direct vision,  positive ETCO2 and breath sounds checked- equal and bilateral Secured at: 22 cm Tube secured with: Tape Dental Injury: Teeth and Oropharynx as per pre-operative assessment  Comments: Glidescope used due to limited ROM of neck due to discomfort and small mouth opening, large tongue.  4x4s bite block used.

## 2019-01-24 NOTE — Anesthesia Preprocedure Evaluation (Addendum)
Anesthesia Evaluation  Patient identified by MRN, date of birth, ID band Patient awake    Reviewed: Allergy & Precautions, NPO status , Patient's Chart, lab work & pertinent test results  History of Anesthesia Complications Negative for: history of anesthetic complications  Airway Mallampati: II  TM Distance: >3 FB Neck ROM: Full    Dental  (+) Dental Advisory Given, Teeth Intact   Pulmonary neg pulmonary ROS,    breath sounds clear to auscultation       Cardiovascular negative cardio ROS   Rhythm:Regular Rate:Normal     Neuro/Psych neck pain and numbness with radiation into the right upper extremity:  Chiari 1    GI/Hepatic Neg liver ROS, GERD  Medicated and Controlled,  Endo/Other  Morbid obesity  Renal/GU negative Renal ROS     Musculoskeletal   Abdominal (+) + obese,   Peds  Hematology negative hematology ROS (+)   Anesthesia Other Findings   Reproductive/Obstetrics                           Anesthesia Physical Anesthesia Plan  ASA: II  Anesthesia Plan: General   Post-op Pain Management:    Induction: Intravenous  PONV Risk Score and Plan: 3 and Ondansetron, Dexamethasone and Scopolamine patch - Pre-op  Airway Management Planned: Oral ETT  Additional Equipment: Arterial line  Intra-op Plan:   Post-operative Plan: Extubation in OR  Informed Consent: I have reviewed the patients History and Physical, chart, labs and discussed the procedure including the risks, benefits and alternatives for the proposed anesthesia with the patient or authorized representative who has indicated his/her understanding and acceptance.     Dental advisory given  Plan Discussed with: CRNA and Surgeon  Anesthesia Plan Comments: (Plan routine monitors, A line, GETA )        Anesthesia Quick Evaluation

## 2019-01-25 ENCOUNTER — Encounter (HOSPITAL_COMMUNITY): Payer: Self-pay | Admitting: Neurosurgery

## 2019-01-25 NOTE — Evaluation (Signed)
Occupational Therapy Evaluation and Discharge Patient Details Name: Mary Moses MRN: [id number] DOB: July 26, 1986 Today's Date: 01/25/2019    History of Present Illness Suboccipital craniectomy, C1 and C2 laminectomy, duraplasty using microdissection due to Chiari I malformation, syringomyelia   Clinical Impression   This 33 yo female admitted and underwent above presents to acute OT at an overall S level and she will have this at home. No further OT needs and no immediate PT needs identified to seek acute PT referral. We will sign off.     Follow Up Recommendations  No OT follow up    Equipment Recommendations  None recommended by OT       Precautions / Restrictions Precautions Precautions: Fall Restrictions Weight Bearing Restrictions: No      Mobility Bed Mobility Overal bed mobility: Independent                Transfers Overall transfer level: Needs assistance Equipment used: Rolling walker (2 wheeled) Transfers: Sit to/from Stand Sit to Stand: Supervision         General transfer comment: Pt ambulated 200 feet (2/3 with RW and 1/3 without AD) all at a S level.    Balance Overall balance assessment: Mild deficits observed, not formally tested                                         ADL either performed or assessed with clinical judgement   ADL                                         General ADL Comments: Overall at a S level at RW or no AD level. Pt did a full B/D/grooming/toileting ADL during OT eval session. We did discuss the consideration of at tub seat--she said she thinks she could possibly borrow one from family but if not she will just sponge bath at bathroom sink until she feels good about standing to take shower.     Vision Patient Visual Report: No change from baseline              Pertinent Vitals/Pain Pain Assessment: Faces Faces Pain Scale: Hurts a little bit Pain Location: neck Pain  Descriptors / Indicators: Sore Pain Intervention(s): Limited activity within patient's tolerance     Hand Dominance Right   Extremity/Trunk Assessment Upper Extremity Assessment Upper Extremity Assessment: Overall WFL for tasks assessed           Communication Communication Communication: No difficulties   Cognition Arousal/Alertness: Awake/alert Behavior During Therapy: WFL for tasks assessed/performed Overall Cognitive Status: Within Functional Limits for tasks assessed                                     General Comments  Pt reports her right leg feels so much better (not heavy or tight) since surgery            Home Living Family/patient expects to be discharged to:: Private residence Living Arrangements: Spouse/significant other;Children;Other relatives Available Help at Discharge: Family Type of Home: House Home Access: Level entry     Home Layout: One level     Bathroom Shower/Tub: Chief Strategy Officer: Standard     Home Equipment:  Walker - 4 wheels          Prior Functioning/Environment Level of Independence: Independent        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        OT Problem List: Decreased strength;Impaired balance (sitting and/or standing)         OT Goals(Current goals can be found in the care plan section) Acute Rehab OT Goals Patient Stated Goal: home tomorrow  OT Frequency:                AM-PAC OT "6 Clicks" Daily Activity     Outcome Measure Help from another person eating meals?: None Help from another person taking care of personal grooming?: None Help from another person toileting, which includes using toliet, bedpan, or urinal?: None Help from another person bathing (including washing, rinsing, drying)?: None Help from another person to put on and taking off regular upper body clothing?: None Help from another person to put on and taking off regular lower body clothing?:  None 6 Click Score: 24   End of Session Equipment Utilized During Treatment: Gait belt;Rolling walker Nurse Communication: Mobility status(one of her IVs came out while she was bathing)  Activity Tolerance: Patient tolerated treatment well Patient left: in chair;with call bell/phone within reach  OT Visit Diagnosis: Unsteadiness on feet (R26.81)                Time: 3335-4562 OT Time Calculation (min): 64 min Charges:  OT General Charges $OT Visit: 1 Visit OT Evaluation $OT Eval Moderate Complexity: 1 Mod OT Treatments $Self Care/Home Management : 38-52 mins  [name], OTR/L Acute [employer] Pager [phone] Office [phone]    [name] 01/25/2019, 11:40 AM

## 2019-01-25 NOTE — Progress Notes (Signed)
Subjective: The patient is alert and pleasant.  She is in no apparent distress.  She looks well.  Objective: Vital signs in last 24 hours: Temp:  [97.9 F (36.6 C)-100.5 F (38.1 C)] 99.3 F (37.4 C) (03/13 0700) Pulse Rate:  [98-123] 101 (03/13 0800) Resp:  [14-20] 15 (03/13 0800) BP: (111-150)/(60-83) 128/74 (03/13 0800) SpO2:  [93 %-100 %] 97 % (03/13 0800) Arterial Line BP: (121-176)/(59-88) 139/68 (03/13 0700) Weight:  [98.9 kg] 98.9 kg (03/12 1033) Estimated body mass index is 35.19 kg/m as calculated from the following:   Height as of this encounter: 5\' 6"  (1.676 m).   Weight as of this encounter: 98.9 kg.   Intake/Output from previous day: 03/12 0701 - 03/13 0700 In: 2594.9 [P.O.:720; I.V.:1675; IV Piggyback:199.9] Out: 2285 [Urine:2135; Blood:150] Intake/Output this shift: No intake/output data recorded.  Physical exam the patient is alert and oriented.  Her wound is clean and dry.  There is no drainage.  Her speech and strength is normal.  Lab Results: Recent Labs    01/24/19 1039  WBC 10.3  HGB 13.4  HCT 41.5  PLT 209   BMET Recent Labs    01/24/19 1039  NA 137  K 3.4*  CL 106  CO2 25  GLUCOSE 112*  BUN 5*  CREATININE 0.95  CALCIUM 8.6*    Studies/Results: No results found.  Assessment/Plan: Postop day #1: The patient is doing well.  We will mobilize her.  She may go home later on today.  I gave her discharge instructions and answered all her questions.  LOS: 1 day     Cristi Loron 01/25/2019, 8:07 AM

## 2019-01-26 MED ORDER — CYCLOBENZAPRINE HCL 10 MG PO TABS: 10.0000 mg | ORAL_TABLET | Freq: Three times a day (TID) | ORAL | 1 refills | Status: DC | PRN

## 2019-01-26 NOTE — Discharge Summary (Signed)
Physician Discharge Summary  Patient ID: Mary Moses MRN: [id number] DOB/AGE: June 05, 1986 33 y.o.  Admit date: 01/24/2019 Discharge date: 01/26/2019  Admission Diagnoses: chiari malformation    Discharge Diagnoses: same   Discharged Condition:  good Hospital Course: The patient was admitted on 01/24/2019 and taken to the operating room where the patient underwent chiari decompression. The patient tolerated the procedure well and was taken to the recovery room and then to the floor in stable condition. The hospital course was routine. There were no complications. The wound remained clean dry and intact. Pt had appropriate neck soreness. No complaints of arm pain or new N/T/W. The patient remained afebrile with stable vital signs, and tolerated a regular diet. The patient continued to increase activities, and pain was well controlled with oral pain medications.   Consults: None  Significant Diagnostic Studies:  Results for orders placed or performed during the hospital encounter of 01/24/19  MRSA PCR Screening  Result Value Ref Range   MRSA by PCR NEGATIVE NEGATIVE  Basic metabolic panel  Result Value Ref Range   Sodium 137 135 - 145 mmol/L   Potassium 3.4 (L) 3.5 - 5.1 mmol/L   Chloride 106 98 - 111 mmol/L   CO2 25 22 - 32 mmol/L   Glucose, Bld 112 (H) 70 - 99 mg/dL   BUN 5 (L) 6 - 20 mg/dL   Creatinine, Ser 8.65 0.44 - 1.00 mg/dL   Calcium 8.6 (L) 8.9 - 10.3 mg/dL   GFR calc non Af Amer >60 >60 mL/min   GFR calc Af Amer >60 >60 mL/min   Anion gap 6 5 - 15  CBC  Result Value Ref Range   WBC 10.3 4.0 - 10.5 K/uL   RBC 4.74 3.87 - 5.11 MIL/uL   Hemoglobin 13.4 12.0 - 15.0 g/dL   HCT 78.4 69.6 - 29.5 %   MCV 87.6 80.0 - 100.0 fL   MCH 28.3 26.0 - 34.0 pg   MCHC 32.3 30.0 - 36.0 g/dL   RDW 28.4 13.2 - 44.0 %   Platelets 209 150 - 400 K/uL   nRBC 0.0 0.0 - 0.2 %  Pregnancy, urine POC  Result Value Ref Range   Preg Test, Ur NEGATIVE NEGATIVE  Type and screen  Result  Value Ref Range   ABO/RH(D) O POS    Antibody Screen NEG    Sample Expiration      01/27/2019 Performed at [facility], [address], [place], [place] [address]     Mr Laqueta Jean Wo Contrast  Result Date: 12/28/2018 CLINICAL DATA:  33 y/o F; numbness to the right-sided head, arm, and leg for 2 months. EXAM: MRI HEAD WITHOUT AND WITH CONTRAST TECHNIQUE: Multiplanar, multiecho pulse sequences of the brain and surrounding structures were obtained without and with intravenous contrast. CONTRAST:  10 cc Gadavist COMPARISON:  11/09/2018 CT head. FINDINGS: Brain: 13 mm cerebellar tonsillar ectopia with a pointed appearance of the cerebellar tonsils and crowding of the foramen magnum compatible with Chiari 1 malformation. Within the upper cervical cord is a partially visualized central cord T2 hyperintense and T1 hypointense structure without enhancement extending from C2 to below the field of view. No hydrocephalus. No reduced diffusion to suggest acute or early subacute infarction. No focal mass effect, extra-axial collection, hydrocephalus, or midline shift of the brain. No additional structural or signal abnormality of the brain parenchyma. No abnormal susceptibility hypointensity to indicate intracranial hemorrhage. Vascular: Normal flow voids. Skull and upper cervical spine: Normal  marrow signal. Sinuses/Orbits: Negative. Other: None. IMPRESSION: 1. 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 Consider a cervical spine MRI to fully assess. 2. No findings of demyelination of the brain. Electronically Signed   By: Mitzi Hansen M.D.   On: 12/28/2018 21:56   Mr Cervical Spine Wo Contrast  Result Date: 01/07/2019 CLINICAL DATA:  Initial evaluation for neck pain and numbness with radiation into the right upper extremity. EXAM: MRI CERVICAL SPINE WITHOUT CONTRAST TECHNIQUE: Multiplanar, multisequence MR imaging of the cervical  spine was performed. No intravenous contrast was administered. COMPARISON:  Prior brain MRI from 12/28/2018 FINDINGS: Alignment: Straightening of the normal cervical lordosis. No listhesis. Vertebrae: Vertebral body heights maintained without evidence for acute or chronic fracture. Bone marrow signal intensity within normal limits. No discrete or worrisome osseous lesions. No abnormal marrow edema. Cord: Irregular heterogeneous expansile T2/STIR hyperintense cystic lesion seen extending from the cervicomedullary junction inferiorly through the T1 level, most likely and irregular syrinx. Few scattered internal septations noted. Overall, lesion measures approximately 0.9 x 1.4 x 11.5 cm in greatest dimensions (AP by transverse by craniocaudad). Diameter maximal at the level of C6-7. No visible syrinx within the upper thoracic spinal cord. 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 Paraspinous and prevertebral soft tissues normal. Normal intravascular flow voids seen within the vertebral arteries bilaterally. Disc levels: C2-C3: Unremarkable. C3-C4: Small central disc protrusion indents the ventral thecal sac. Slight superior migration. Mild spinal stenosis without frank cord deformity. Foramina are patent. C4-C5:  Unremarkable. C5-C6:  Unremarkable. C6-C7: Tiny central disc protrusion mildly indents the ventral thecal sac. Minimal spinal stenosis without cord impingement. Foramina remain patent. C7-T1:  Unremarkable. Visualized upper thoracic spine demonstrates no significant finding. IMPRESSION: 1. Heterogeneous long segment cystic abnormality extending from the cervicomedullary junction through T1, most compatible with a syrinx. Postcontrast imaging of the cervical spine recommended for complete evaluation. 2. Associated Chiari 1 malformation. 3. Small central disc  protrusions at C3-4 and C6-7 with minimal spinal stenosis. Electronically Signed   By: Rise Mu M.D.   On: 01/07/2019 17:56    Antibiotics:  Anti-infectives (From admission, onward)   Start     Dose/Rate Route Frequency Ordered Stop   01/24/19 2230  vancomycin (VANCOCIN) IVPB 1000 mg/200 mL premix     1,000 mg 200 mL/hr over 60 Minutes Intravenous  Once 01/24/19 1614 01/25/19 0039   01/24/19 1155  bacitracin 50,000 Units in sodium chloride 0.9 % 500 mL irrigation  Status:  Discontinued       As needed 01/24/19 1155 01/24/19 1425   01/24/19 1100  vancomycin (VANCOCIN) 1,500 mg in sodium chloride 0.9 % 500 mL IVPB     1,500 mg 250 mL/hr over 120 Minutes Intravenous To [facility] 01/23/19 1446 01/24/19 1240      Discharge Exam: Blood pressure 123/79, pulse 89, temperature 99.1 F (37.3 C), temperature source Oral, resp. rate 12, height 5\' 6"  (1.676 m), weight 98.9 kg, last menstrual period 12/28/2018, SpO2 99 %. Neurologic: Grossly normal Dressing dry  Discharge Medications:   Allergies as of 01/26/2019      Reactions   Penicillins Nausea And Vomiting   Has patient had a PCN reaction causing immediate rash, facial/tongue/throat swelling, SOB or lightheadedness with hypotension: no Has patient had a PCN reaction causing severe rash involving mucus membranes or  skin necrosis: no Has patient had a PCN reaction that required hospitalization no Has patient had a PCN reaction occurring within the last 10 years: no If all of the above answers are "NO", then may proceed with Cephalosporin    Tramadol Hcl Nausea And Vomiting      Medication List    STOP taking these medications   predniSONE 10 MG tablet Commonly known as:  DELTASONE   predniSONE 20 MG tablet Commonly known as:  DELTASONE     TAKE these medications   cyclobenzaprine 10 MG tablet Commonly known as:  FLEXERIL Take 1 tablet (10 mg total) by mouth 3 (three) times daily as needed for muscle spasms.    Linzess 145 MCG Caps capsule Generic drug:  linaclotide TAKE 1 CAPSULE BY MOUTH 30 MINUTES PRIOR TO YOUR FIRST MEAL. What changed:  See the new instructions.   pantoprazole 20 MG tablet Commonly known as:  PROTONIX TAKE 1 TABLET BY MOUTH TWICE DAILY   vitamin B-12 500 MCG tablet Commonly known as:  CYANOCOBALAMIN Take 500 mcg by mouth daily.   Vitamin D (Ergocalciferol) 1.25 MG (50000 UT) Caps capsule Commonly known as:  DRISDOL Take 1 capsule (50,000 Units total) by mouth every 7 (seven) days. Mondays What changed:    when to take this  additional instructions   Xulane 150-35 MCG/24HR transdermal patch Generic drug:  norelgestromin-ethinyl estradiol Place 1 patch onto the skin once a week.       Disposition: home   Final Dx: chiari decompression  Discharge Instructions    Call MD for:  difficulty breathing, headache or visual disturbances   Complete by:  As directed    Call MD for:  difficulty breathing, headache or visual disturbances   Complete by:  As directed    Call MD for:  persistant nausea and vomiting   Complete by:  As directed    Call MD for:  persistant nausea and vomiting   Complete by:  As directed    Call MD for:  redness, tenderness, or signs of infection (pain, swelling, redness, odor or green/yellow discharge around incision site)   Complete by:  As directed    Call MD for:  redness, tenderness, or signs of infection (pain, swelling, redness, odor or green/yellow discharge around incision site)   Complete by:  As directed    Call MD for:  severe uncontrolled pain   Complete by:  As directed    Call MD for:  severe uncontrolled pain   Complete by:  As directed    Call MD for:  temperature >100.4   Complete by:  As directed    Call MD for:  temperature >100.4   Complete by:  As directed    Diet - low sodium heart healthy   Complete by:  As directed    Diet - low sodium heart healthy   Complete by:  As directed    Increase activity slowly    Complete by:  As directed    Increase activity slowly   Complete by:  As directed    Remove dressing in 48 hours   Complete by:  As directed    Remove dressing in 48 hours   Complete by:  As directed       Follow-up Information    Tressie Stalker, MD. Schedule an appointment as soon as possible for a visit in 2 week(s).   Specialty:  Neurosurgery Contact information: [address] [place] [address] [phone]  Signed: Tia Alert 01/26/2019, 10:56 AM

## 2019-02-17 ENCOUNTER — Other Ambulatory Visit: Payer: Self-pay

## 2019-02-17 ENCOUNTER — Emergency Department (HOSPITAL_COMMUNITY)
Admission: EM | Admit: 2019-02-17 | Discharge: 2019-02-17 | Disposition: A | Discharge status: Home / Self Care | Payer: 59 | Attending: Emergency Medicine | Admitting: Emergency Medicine

## 2019-02-17 ENCOUNTER — Encounter (HOSPITAL_COMMUNITY): Payer: Self-pay | Admitting: *Deleted

## 2019-02-17 ENCOUNTER — Emergency Department (HOSPITAL_COMMUNITY): Payer: 59

## 2019-02-17 DIAGNOSIS — E860 Dehydration: Secondary | ICD-10-CM

## 2019-02-17 DIAGNOSIS — Z79899 Other long term (current) drug therapy: Secondary | ICD-10-CM | POA: Insufficient documentation

## 2019-02-17 DIAGNOSIS — R1013 Epigastric pain: Secondary | ICD-10-CM | POA: Diagnosis not present

## 2019-02-17 DIAGNOSIS — R112 Nausea with vomiting, unspecified: Secondary | ICD-10-CM | POA: Insufficient documentation

## 2019-02-17 DIAGNOSIS — N3000 Acute cystitis without hematuria: Secondary | ICD-10-CM

## 2019-02-17 LAB — PREGNANCY, URINE: Preg Test, Ur: NEGATIVE

## 2019-02-17 LAB — COMPREHENSIVE METABOLIC PANEL
ALT: 15 U/L (ref 0–44)
AST: 14 U/L — ABNORMAL LOW (ref 15–41)
Albumin: 3.8 g/dL (ref 3.5–5.0)
Alkaline Phosphatase: 53 U/L (ref 38–126)
Anion gap: 9 (ref 5–15)
BUN: 11 mg/dL (ref 6–20)
CO2: 29 mmol/L (ref 22–32)
Calcium: 9 mg/dL (ref 8.9–10.3)
Chloride: 99 mmol/L (ref 98–111)
Creatinine, Ser: 0.88 mg/dL (ref 0.44–1.00)
GFR calc Af Amer: >60 mL/min (ref >60)
GFR calc non Af Amer: >60 mL/min (ref >60)
Glucose, Bld: 92 mg/dL (ref 70–99)
Potassium: 3.4 mmol/L — ABNORMAL LOW (ref 3.5–5.1)
Sodium: 137 mmol/L (ref 135–145)
Total Bilirubin: 0.3 mg/dL (ref 0.3–1.2)
Total Protein: 8.1 g/dL (ref 6.5–8.1)

## 2019-02-17 LAB — URINALYSIS, ROUTINE W REFLEX MICROSCOPIC
Bilirubin Urine: NEGATIVE
Glucose, UA: NEGATIVE mg/dL
Ketones, ur: 80 mg/dL — AB
Nitrite: NEGATIVE
Protein, ur: NEGATIVE mg/dL
Specific Gravity, Urine: 1.025 (ref 1.005–1.030)
pH: 8 (ref 5.0–8.0)

## 2019-02-17 LAB — CBC WITH DIFFERENTIAL/PLATELET
Abs Immature Granulocytes: 0.02 10*3/uL (ref 0.00–0.07)
Basophils Absolute: 0 10*3/uL (ref 0.0–0.1)
Basophils Relative: 0 %
Eosinophils Absolute: 0 10*3/uL (ref 0.0–0.5)
Eosinophils Relative: 0 %
HCT: 40.6 % (ref 36.0–46.0)
Hemoglobin: 13 g/dL (ref 12.0–15.0)
Immature Granulocytes: 0 %
Lymphocytes Relative: 16 %
Lymphs Abs: 1.9 10*3/uL (ref 0.7–4.0)
MCH: 28 pg (ref 26.0–34.0)
MCHC: 32 g/dL (ref 30.0–36.0)
MCV: 87.5 fL (ref 80.0–100.0)
Monocytes Absolute: 0.8 10*3/uL (ref 0.1–1.0)
Monocytes Relative: 7 %
Neutro Abs: 9 10*3/uL — ABNORMAL HIGH (ref 1.7–7.7)
Neutrophils Relative %: 77 %
Platelets: 208 10*3/uL (ref 150–400)
RBC: 4.64 MIL/uL (ref 3.87–5.11)
RDW: 13.2 % (ref 11.5–15.5)
WBC: 11.8 10*3/uL — ABNORMAL HIGH (ref 4.0–10.5)
nRBC: 0 % (ref 0.0–0.2)

## 2019-02-17 LAB — LIPASE, BLOOD: Lipase: 34 U/L (ref 11–51)

## 2019-02-17 MED ORDER — SULFAMETHOXAZOLE-TRIMETHOPRIM 800-160 MG PO TABS
1.0000 | ORAL_TABLET | Freq: Once | ORAL | Status: AC
  Administered 2019-02-17: 1 via ORAL
  Filled 2019-02-17: qty 1

## 2019-02-17 MED ORDER — POTASSIUM CHLORIDE CRYS ER 20 MEQ PO TBCR
40.0000 meq | EXTENDED_RELEASE_TABLET | Freq: Once | ORAL | Status: AC
  Administered 2019-02-17: 40 meq via ORAL
  Filled 2019-02-17: qty 2

## 2019-02-17 MED ORDER — FAMOTIDINE IN NACL 20-0.9 MG/50ML-% IV SOLN
20.0000 mg | Freq: Once | INTRAVENOUS | Status: AC
  Administered 2019-02-17: 20 mg via INTRAVENOUS
  Filled 2019-02-17: qty 50

## 2019-02-17 MED ORDER — SULFAMETHOXAZOLE-TRIMETHOPRIM 800-160 MG PO TABS: 1.0000 | ORAL_TABLET | Freq: Two times a day (BID) | ORAL | 0 refills | Status: AC

## 2019-02-17 MED ORDER — PROMETHAZINE HCL 25 MG RE SUPP: 25.0000 mg | Freq: Four times a day (QID) | RECTAL | 0 refills | Status: DC | PRN

## 2019-02-17 MED ORDER — ONDANSETRON HCL 4 MG/2ML IJ SOLN
4.0000 mg | INTRAMUSCULAR | Status: DC | PRN
  Filled 2019-02-17: qty 2

## 2019-02-17 MED ORDER — SODIUM CHLORIDE 0.9 % IV BOLUS
1000.0000 mL | Freq: Once | INTRAVENOUS | Status: AC
  Administered 2019-02-17: 1000 mL via INTRAVENOUS

## 2019-02-17 NOTE — ED Triage Notes (Signed)
Pt with emesis since Friday, today has emesis x 3, denies diarrhea, c/o abd burning and chills, denies fevers at home.

## 2019-02-17 NOTE — ED Provider Notes (Signed)
[facility] EMERGENCY DEPARTMENT Provider Note   CSN: [id number] Arrival date & time: 02/17/19  1801    History   Chief Complaint Chief Complaint  Patient presents with  . Emesis    HPI Mary Moses is a 33 y.o. female.     HPI  Pt was seen at 1825.  Per pt, c/o gradual onset and persistence of multiple intermittent episodes of N/V that began 2 days ago. Has been associated with "burning" in her upper abd. Otherwise denies abd pain. Pt states she "just thought it was my reflux." Pt states she was rx nausea meds by her PMD yesterday "but vomited them up."  Last BM yesterday "normal" per pt. Denies diarrhea, no CP/SOB, no cough, no rash, no neck or back pain, no fevers, no black or blood in stools or emesis.     Past Medical History:  Diagnosis Date  . Abdominal bloating 09/16/2015  . Abnormal Pap smear   . Chiari I malformation (HCC)   . Cramps, extremity 09/16/2015  . Drug induced amenorrhea 09/16/2015   Has nexplanon  . GERD (gastroesophageal reflux disease)   . Hypokalemia   . Nexplanon in place 09/16/2015  . Nexplanon insertion 07/09/2013   nexplanon inserted 8/26 left arm remove 07/09/16  . Obesity   . Pregnancy   . Swelling 06/19/2013  . Vaginal Pap smear, abnormal     Patient Active Problem List   Diagnosis Date Noted  . Chiari I malformation (HCC) 01/24/2019  . Numbness and tingling 11/09/2018  . Gastroesophageal reflux disease 05/10/2017  . History of hypokalemia 05/10/2017  . Cramps, extremity 09/16/2015  . Abdominal bloating 09/16/2015  . Nexplanon in place 09/16/2015  . Drug induced amenorrhea 09/16/2015  . 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 Nexplanon insertion 07/09/2013  . Abnormal Pap smear of cervix 04/01/2013    Past Surgical History:  Procedure Laterality Date  . CESAREAN SECTION N/A 06/08/2013   Procedure: Primary CESAREAN SECTION of baby girl  at 2056 APGAR 8/9;  Surgeon: Tereso Newcomer, MD;  Location: [facility];  Service: Obstetrics;   Laterality: N/A;  . SUBOCCIPITAL CRANIECTOMY CERVICAL LAMINECTOMY  01/24/2019   Procedure: Suboccipital craniectomy/cervical laminectomy/duraplasty;  Surgeon: Tressie Stalker, MD;  Location: [facility];  Service: Neurosurgery;;     OB History    Gravida  1   Para  1   Term  1   Preterm      AB      Living  1     SAB      TAB      Ectopic      Multiple      Live Births  1            Home Medications    Prior to Admission medications   Medication Sig Start Date End Date Taking? Authorizing Provider  cyclobenzaprine (FLEXERIL) 10 MG tablet Take 1 tablet (10 mg total) by mouth 3 (three) times daily as needed for muscle spasms. 01/26/19   Tia Alert, MD  LINZESS 145 MCG CAPS capsule TAKE 1 CAPSULE BY MOUTH 30 MINUTES PRIOR TO YOUR FIRST MEAL. Patient taking differently: Take 145 mcg by mouth daily as needed (constipation).  03/14/18   Anice Paganini, NP  norelgestromin-ethinyl estradiol Burr Medico) 150-35 MCG/24HR transdermal patch Place 1 patch onto the skin once a week.    [provider]  pantoprazole (PROTONIX) 20 MG tablet TAKE 1 TABLET BY MOUTH TWICE DAILY Patient taking differently: Take 20 mg  by mouth 2 (two) times daily.  10/15/18   Adline Potter, NP  vitamin B-12 (CYANOCOBALAMIN) 500 MCG tablet Take 500 mcg by mouth daily.    [provider]  Vitamin D, Ergocalciferol, (DRISDOL) 50000 units CAPS capsule Take 1 capsule (50,000 Units total) by mouth every 7 (seven) days. Mondays Patient taking differently: Take 50,000 Units by mouth every Monday.  07/20/18   Lazaro Arms, MD    Family History Family History  Problem Relation Age of Onset  . Kidney disease Mother   . Heart disease Mother   . Hypertension Mother   . Cancer Father        metastatic prostate   . Hypertension Sister     Social History Social History   Tobacco Use  . Smoking status: Never Smoker  . Smokeless tobacco: Never Used  Substance Use Topics  . Alcohol use: No  .  Drug use: No     Allergies   Penicillins and Tramadol hcl   Review of Systems Review of Systems ROS: Statement: All systems negative except as marked or noted in the HPI; Constitutional: Negative for fever and chills. ; ; Eyes: Negative for eye pain, redness and discharge. ; ; ENMT: Negative for ear pain, hoarseness, nasal congestion, sinus pressure and sore throat. ; ; Cardiovascular: Negative for chest pain, palpitations, diaphoresis, dyspnea and peripheral edema. ; ; Respiratory: Negative for cough, wheezing and stridor. ; ; Gastrointestinal: +N/V. Negative for diarrhea, abdominal pain, blood in stool, hematemesis, jaundice and rectal bleeding. . ; ; Genitourinary: Negative for dysuria, flank pain and hematuria. ; ; Musculoskeletal: Negative for back pain and neck pain. Negative for swelling and trauma.; ; Skin: Negative for pruritus, rash, abrasions, blisters, bruising and skin lesion.; ; Neuro: Negative for headache, lightheadedness and neck stiffness. Negative for weakness, altered level of consciousness, altered mental status, extremity weakness, paresthesias, involuntary movement, seizure and syncope.       Physical Exam Updated Vital Signs BP (!) 110/55 (BP Location: Left Arm)   Pulse 91   Temp 98.2 F (36.8 C) (Oral)   Resp 16   Ht 5\' 6"  (1.676 m)   Wt 98.9 kg   LMP 01/24/2019   SpO2 100%   BMI 35.19 kg/m   Physical Exam 1830: Physical examination:  Nursing notes reviewed; Vital signs and O2 SAT reviewed;  Constitutional: Well developed, Well nourished, Well hydrated, In no acute distress; Head:  Normocephalic, atraumatic; Eyes: EOMI, PERRL, No scleral icterus; ENMT: Mouth and pharynx normal, Mucous membranes moist; Neck: Supple, Full range of motion, No lymphadenopathy; Cardiovascular: Regular rate and rhythm, No gallop; Respiratory: Breath sounds clear & equal bilaterally, No wheezes.  Speaking full sentences with ease, Normal respiratory effort/excursion; Chest: Nontender,  Movement normal; Abdomen: Soft, +mild mid-epigastric tenderness to palp. No rebound or guarding. Nondistended, Normal bowel sounds; Genitourinary: No CVA tenderness; Extremities: Peripheral pulses normal, No tenderness, No edema, No calf edema or asymmetry.; Neuro: AA&Ox3, Major CN grossly intact.  Speech clear. No gross focal motor or sensory deficits in extremities.; Skin: Color normal, Warm, Dry.   ED Treatments / Results  Labs (all labs ordered are listed, but only abnormal results are displayed)   EKG None  Radiology   Procedures Procedures (including critical care time)  Medications Ordered in ED Medications  famotidine (PEPCID) IVPB 20 mg premix (20 mg Intravenous New Bag/Given 02/17/19 1910)  ondansetron (ZOFRAN) injection 4 mg (has no administration in time range)  sodium chloride 0.9 % bolus 1,000 mL (1,000  mLs Intravenous New Bag/Given 02/17/19 1908)     Initial Impression / Assessment and Plan / ED Course  I have reviewed the triage vital signs and the nursing notes.  Pertinent labs & imaging results that were available during my care of the patient were reviewed by me and considered in my medical decision making (see chart for details).     MDM Reviewed: previous chart, nursing note and vitals Reviewed previous: labs Interpretation: labs and x-ray    Results for orders placed or performed during the hospital encounter of 02/17/19  Comprehensive metabolic panel  Result Value Ref Range   Sodium 137 135 - 145 mmol/L   Potassium 3.4 (L) 3.5 - 5.1 mmol/L   Chloride 99 98 - 111 mmol/L   CO2 29 22 - 32 mmol/L   Glucose, Bld 92 70 - 99 mg/dL   BUN 11 6 - 20 mg/dL   Creatinine, Ser 4.54 0.44 - 1.00 mg/dL   Calcium 9.0 8.9 - 09.8 mg/dL   Total Protein 8.1 6.5 - 8.1 g/dL   Albumin 3.8 3.5 - 5.0 g/dL   AST 14 (L) 15 - 41 U/L   ALT 15 0 - 44 U/L   Alkaline Phosphatase 53 38 - 126 U/L   Total Bilirubin 0.3 0.3 - 1.2 mg/dL   GFR calc non Af Amer >60 >60 mL/min   GFR  calc Af Amer >60 >60 mL/min   Anion gap 9 5 - 15  Lipase, blood  Result Value Ref Range   Lipase 34 11 - 51 U/L  CBC with Differential  Result Value Ref Range   WBC 11.8 (H) 4.0 - 10.5 K/uL   RBC 4.64 3.87 - 5.11 MIL/uL   Hemoglobin 13.0 12.0 - 15.0 g/dL   HCT 11.9 14.7 - 82.9 %   MCV 87.5 80.0 - 100.0 fL   MCH 28.0 26.0 - 34.0 pg   MCHC 32.0 30.0 - 36.0 g/dL   RDW 56.2 13.0 - 86.5 %   Platelets 208 150 - 400 K/uL   nRBC 0.0 0.0 - 0.2 %   Neutrophils Relative % 77 %   Neutro Abs 9.0 (H) 1.7 - 7.7 K/uL   Lymphocytes Relative 16 %   Lymphs Abs 1.9 0.7 - 4.0 K/uL   Monocytes Relative 7 %   Monocytes Absolute 0.8 0.1 - 1.0 K/uL   Eosinophils Relative 0 %   Eosinophils Absolute 0.0 0.0 - 0.5 K/uL   Basophils Relative 0 %   Basophils Absolute 0.0 0.0 - 0.1 K/uL   Immature Granulocytes 0 %   Abs Immature Granulocytes 0.02 0.00 - 0.07 K/uL    1950:  UA/preg, AXR and PO challenge pending. Sign out to oncoming EDP [place].       Final Clinical Impressions(s) / ED Diagnoses   Final diagnoses:  None    ED Discharge Orders    None       Samuel Jester, DO 02/21/19 7846

## 2019-02-17 NOTE — ED Notes (Signed)
Patient transported to X-ray 

## 2019-02-17 NOTE — ED Provider Notes (Signed)
Pt signed out by Dr. Clarene Duke pending labs and po challenge.  Labs show UTI.  She is tolerating crackers and water.  She is d/c home with phenergan suppositories as she could not keep down the bactrim.  She is also d/c home with bactrim.  Return if worse.  F/u with pcp.   Jacalyn Lefevre, MD 02/17/19 2240

## 2019-02-27 ENCOUNTER — Ambulatory Visit: Payer: 59 | Admitting: Neurology

## 2019-03-25 ENCOUNTER — Ambulatory Visit (INDEPENDENT_AMBULATORY_CARE_PROVIDER_SITE_OTHER): Payer: 59 | Admitting: Otolaryngology

## 2019-03-25 ENCOUNTER — Other Ambulatory Visit: Payer: Self-pay

## 2019-03-25 DIAGNOSIS — H8113 Benign paroxysmal vertigo, bilateral: Secondary | ICD-10-CM | POA: Diagnosis not present

## 2019-03-25 DIAGNOSIS — H900 Conductive hearing loss, bilateral: Secondary | ICD-10-CM | POA: Diagnosis not present

## 2019-03-25 DIAGNOSIS — R42 Dizziness and giddiness: Secondary | ICD-10-CM

## 2019-03-25 DIAGNOSIS — H6121 Impacted cerumen, right ear: Secondary | ICD-10-CM | POA: Diagnosis not present

## 2019-04-09 ENCOUNTER — Other Ambulatory Visit: Payer: Self-pay | Admitting: Neurosurgery

## 2019-04-09 ENCOUNTER — Other Ambulatory Visit (HOSPITAL_COMMUNITY): Payer: Self-pay | Admitting: Neurosurgery

## 2019-04-09 DIAGNOSIS — G935 Compression of brain: Secondary | ICD-10-CM

## 2019-04-10 ENCOUNTER — Other Ambulatory Visit: Payer: Self-pay | Admitting: Adult Health

## 2019-04-22 ENCOUNTER — Ambulatory Visit (HOSPITAL_COMMUNITY)
Admission: RE | Admit: 2019-04-22 | Discharge: 2019-04-22 | Disposition: A | Discharge status: Home / Self Care | Payer: 59 | Source: Ambulatory Visit | Attending: Neurosurgery | Admitting: Neurosurgery

## 2019-04-22 ENCOUNTER — Other Ambulatory Visit: Payer: Self-pay

## 2019-04-22 DIAGNOSIS — G935 Compression of brain: Secondary | ICD-10-CM

## 2019-05-03 ENCOUNTER — Ambulatory Visit: Payer: 59 | Admitting: Neurology

## 2019-05-12 ENCOUNTER — Other Ambulatory Visit: Payer: Self-pay | Admitting: Adult Health

## 2019-09-11 ENCOUNTER — Other Ambulatory Visit: Payer: Self-pay

## 2019-09-11 DIAGNOSIS — Z20822 Contact with and (suspected) exposure to covid-19: Secondary | ICD-10-CM

## 2019-09-12 LAB — NOVEL CORONAVIRUS, NAA: SARS-CoV-2, NAA: NOT DETECTED

## 2019-09-16 ENCOUNTER — Other Ambulatory Visit: Payer: Self-pay | Admitting: Advanced Practice Midwife

## 2019-10-22 ENCOUNTER — Other Ambulatory Visit: Payer: Self-pay

## 2019-10-22 DIAGNOSIS — Z20822 Contact with and (suspected) exposure to covid-19: Secondary | ICD-10-CM

## 2019-10-24 LAB — NOVEL CORONAVIRUS, NAA: SARS-CoV-2, NAA: DETECTED — AB

## 2019-11-05 ENCOUNTER — Other Ambulatory Visit: Payer: 59 | Admitting: Adult Health

## 2019-11-25 ENCOUNTER — Encounter: Payer: Self-pay | Admitting: Adult Health

## 2019-11-25 ENCOUNTER — Other Ambulatory Visit: Payer: Self-pay

## 2019-11-25 ENCOUNTER — Other Ambulatory Visit (HOSPITAL_COMMUNITY)
Admission: RE | Admit: 2019-11-25 | Discharge: 2019-11-25 | Disposition: A | Discharge status: Home / Self Care | Payer: 59 | Source: Ambulatory Visit | Attending: Adult Health | Admitting: Adult Health

## 2019-11-25 ENCOUNTER — Ambulatory Visit (INDEPENDENT_AMBULATORY_CARE_PROVIDER_SITE_OTHER): Payer: 59 | Admitting: Adult Health

## 2019-11-25 VITALS — BP 115/63 | HR 89 | Ht 66.0 in | Wt 191.6 lb

## 2019-11-25 DIAGNOSIS — Z01419 Encounter for gynecological examination (general) (routine) without abnormal findings: Secondary | ICD-10-CM | POA: Insufficient documentation

## 2019-11-25 DIAGNOSIS — N946 Dysmenorrhea, unspecified: Secondary | ICD-10-CM | POA: Diagnosis not present

## 2019-11-25 DIAGNOSIS — N921 Excessive and frequent menstruation with irregular cycle: Secondary | ICD-10-CM | POA: Diagnosis not present

## 2019-11-25 DIAGNOSIS — Z3045 Encounter for surveillance of transdermal patch hormonal contraceptive device: Secondary | ICD-10-CM | POA: Diagnosis not present

## 2019-11-25 MED ORDER — XULANE 150-35 MCG/24HR TD PTWK: MEDICATED_PATCH | TRANSDERMAL | 6 refills | Status: DC

## 2019-11-25 NOTE — Progress Notes (Signed)
Patient ID: Mary Moses, female   DOB: 1986/02/01, 34 y.o.   MRN: [id number] History of Present Illness: Mary Moses is a 34 year old black female, single G1P1 in for a well woman gyn exam and pap.   Current Medications, Allergies, Past Medical History, Past Surgical History, Family History and Social History were reviewed in [employer] record.     Review of Systems: Patient denies any  hearing loss, fatigue, blurred vision, shortness of breath, chest pain, abdominal pain, problems with bowel movements, urination, or intercourse. No joint pain or mood swings. Has 7 day periods and cramps with the patch, has tried nexplanon and gained weight and had removed, has tried OCs, depo and does not want the ring or IUD Has headaches with periods too.    Physical Exam:BP 115/63 (BP Location: Right Arm, Patient Position: Sitting, Cuff Size: Normal)   Pulse 89   Ht 5\' 6"  (1.676 m)   Wt 191 lb 9.6 oz (86.9 kg)   LMP 11/23/2019   BMI 30.93 kg/m  General:  Well developed, well nourished, no acute distress Skin:  Warm and dry Neck:  Midline trachea, normal thyroid, good ROM, no lymphadenopathy Lungs; Clear to auscultation bilaterally Breast:  No dominant palpable mass, retraction, or nipple discharge Cardiovascular: Regular rate and rhythm Abdomen:  Soft, non tender, no hepatosplenomegaly Pelvic:  External genitalia is normal in appearance, no lesions.  The vagina is normal in appearance,+period blood. Urethra has no lesions or masses. The cervix is bulbous.Pap with GC/CHL and high risk HPV 16/18 genotyping performed.  Uterus is felt to be normal size, shape, and contour.  No adnexal masses or tenderness noted.Bladder is non tender, no masses felt. Extremities/musculoskeletal:  No swelling or varicosities noted, no clubbing or cyanosis Psych:  No mood changes, alert and cooperative,seems happy Fall risk is low PHQ 9 score is 0. Examination chaperoned by 01/21/2020  LPN  Impression and Plan: 1. Encounter for gynecological examination with Papanicolaou smear of cervix Pap sent Physical in  1 year Mammogram at 38  2. Menorrhagia with irregular cycle Discussed endometrial ablation as option Review handout on ablation and tubal ligation Return in 4 weeks to discuss with Dr Malachy Mood   3. Dysmenorrhea   4. Encounter for surveillance of transdermal patch hormonal contraceptive device Will refill the patch for now Meds ordered this encounter  Medications  . norelgestromin-ethinyl estradiol Despina Hidden) 150-35 MCG/24HR transdermal patch    Sig: APPLY 1 PATCH TOPICALLY ONCE A WEEK    Dispense:  3 patch    Refill:  6    Order Specific Question:   Supervising Provider    Answer:   Burr Medico H [[id number]]

## 2019-11-28 LAB — CYTOLOGY - PAP
Chlamydia: NEGATIVE
Comment: NEGATIVE
Comment: NEGATIVE
Comment: NEGATIVE
Comment: NORMAL
Diagnosis: NEGATIVE
HPV 16: NEGATIVE
HPV 18 / 45: NEGATIVE
High risk HPV: POSITIVE — AB
Neisseria Gonorrhea: NEGATIVE

## 2019-11-29 ENCOUNTER — Encounter: Payer: Self-pay | Admitting: Adult Health

## 2019-11-29 DIAGNOSIS — R87810 Cervical high risk human papillomavirus (HPV) DNA test positive: Secondary | ICD-10-CM | POA: Insufficient documentation

## 2019-12-10 ENCOUNTER — Other Ambulatory Visit: Payer: Self-pay | Admitting: Adult Health

## 2019-12-23 ENCOUNTER — Encounter: Payer: Self-pay | Admitting: Obstetrics & Gynecology

## 2019-12-23 ENCOUNTER — Ambulatory Visit (INDEPENDENT_AMBULATORY_CARE_PROVIDER_SITE_OTHER): Payer: 59 | Admitting: Obstetrics & Gynecology

## 2019-12-23 ENCOUNTER — Other Ambulatory Visit: Payer: Self-pay

## 2019-12-23 VITALS — BP 124/71 | HR 99 | Ht 66.0 in | Wt 195.0 lb

## 2019-12-23 DIAGNOSIS — N920 Excessive and frequent menstruation with regular cycle: Secondary | ICD-10-CM | POA: Diagnosis not present

## 2019-12-23 DIAGNOSIS — N946 Dysmenorrhea, unspecified: Secondary | ICD-10-CM

## 2019-12-23 NOTE — Progress Notes (Signed)
Patient ID: Mary Moses, female   DOB: 09/07/1986, 34 y.o.   MRN: [id number]      Chief Complaint  Patient presents with  . surgical Consult      34 y.o. G1P1001 Patient's last menstrual period was 11/23/2019. The current method of family planning is none.  Outpatient Encounter Medications as of 12/23/2019  Medication Sig  . LINZESS 145 MCG CAPS capsule TAKE 1 CAPSULE BY MOUTH 30 MINUTES PRIOR TO YOUR FIRST MEAL. (Patient taking differently: Take 145 mcg by mouth daily as needed (constipation). )  . norelgestromin-ethinyl estradiol [employer]) 150-35 MCG/24HR transdermal patch APPLY 1 PATCH TOPICALLY ONCE A WEEK  . pantoprazole (PROTONIX) 20 MG tablet Take 1 tablet by mouth twice daily  . promethazine (PHENERGAN) 25 MG suppository Place 1 suppository (25 mg total) rectally every 6 (six) hours as needed for nausea or vomiting.  . vitamin B-12 (CYANOCOBALAMIN) 500 MCG tablet Take 500 mcg by mouth daily.  . Vitamin D, Ergocalciferol, (DRISDOL) 50000 units CAPS capsule Take 1 capsule (50,000 Units total) by mouth every 7 (seven) days. Mondays (Patient taking differently: Take 50,000 Units by mouth every Monday. )  . cyclobenzaprine (FLEXERIL) 10 MG tablet Take 1 tablet (10 mg total) by mouth 3 (three) times daily as needed for muscle spasms. (Patient not taking: Reported on 11/25/2019)   No facility-administered encounter medications on file as of 12/23/2019.    Subjective Pt with long history of menorrhagia unresponsive to nexplanon patch depo provera Was scheduled 07/2018 for surgery but backed out Soils sheets wears double pads soils clothes  Uses 36 pads per cycle Heavy pain as well no dyspareunia  No pain other than with cycle Past Medical History:  Diagnosis Date  . Abdominal bloating 09/16/2015  . Abnormal Pap smear   . Chiari I malformation (HCC)   . Cramps, extremity 09/16/2015  . Drug induced amenorrhea 09/16/2015   Has nexplanon  . GERD (gastroesophageal reflux disease)     . Hypokalemia   . Nexplanon in place 09/16/2015  . Nexplanon insertion 07/09/2013   nexplanon inserted 8/26 left arm remove 07/09/16  . Obesity   . Pregnancy   . Swelling 06/19/2013  . Vaginal Pap smear, abnormal     Past Surgical History:  Procedure Laterality Date  . CESAREAN SECTION N/A 06/08/2013   Procedure: Primary CESAREAN SECTION of baby girl  at 2056 APGAR 8/9;  Surgeon: Tereso Newcomer, MD;  Location: [facility];  Service: Obstetrics;  Laterality: N/A;  . SUBOCCIPITAL CRANIECTOMY CERVICAL LAMINECTOMY  01/24/2019   Procedure: Suboccipital craniectomy/cervical laminectomy/duraplasty;  Surgeon: Tressie Stalker, MD;  Location: [facility] OR;  Service: Neurosurgery;;    OB History    Gravida  1   Para  1   Term  1   Preterm      AB      Living  1     SAB      TAB      Ectopic      Multiple      Live Births  1           Allergies  Allergen Reactions  . Penicillins Nausea And Vomiting    Has patient had a PCN reaction causing immediate rash, facial/tongue/throat swelling, SOB or lightheadedness with hypotension: no Has patient had a PCN reaction causing severe rash involving mucus membranes or skin necrosis: no Has patient had a PCN reaction that required hospitalization no Has patient had a PCN reaction occurring within the last 10 years: no  If all of the above answers are "NO", then may proceed with Cephalosporin   . Tramadol Hcl Nausea And Vomiting    Social History   Socioeconomic History  . Marital status: Single    Spouse name: Not on file  . Number of children: Not on file  . Years of education: Not on file  . Highest education level: Not on file  Occupational History  . Not on file  Tobacco Use  . Smoking status: Never Smoker  . Smokeless tobacco: Never Used  Substance and Sexual Activity  . Alcohol use: No  . Drug use: No  . Sexual activity: Yes    Birth control/protection: Patch  Other Topics Concern  . Not on file  Social History Narrative   . Not on file   Social Determinants of Health   Financial Resource Strain:   . Difficulty of Paying Living Expenses: Not on file  Food Insecurity:   . Worried About Programme researcher, broadcasting/film/video in the Last Year: Not on file  . Ran Out of Food in the Last Year: Not on file  Transportation Needs:   . Lack of Transportation (Medical): Not on file  . Lack of Transportation (Non-Medical): Not on file  Physical Activity:   . Days of Exercise per Week: Not on file  . Minutes of Exercise per Session: Not on file  Stress:   . Feeling of Stress : Not on file  Social Connections:   . Frequency of Communication with Friends and Family: Not on file  . Frequency of Social Gatherings with Friends and Family: Not on file  . Attends Religious Services: Not on file  . Active Member of Clubs or Organizations: Not on file  . Attends Banker Meetings: Not on file  . Marital Status: Not on file    Family History  Problem Relation Age of Onset  . Kidney disease Mother   . Heart disease Mother   . Hypertension Mother   . Cancer Father        metastatic prostate   . Hypertension Sister     Medications:       Current Outpatient Medications:  .  LINZESS 145 MCG CAPS capsule, TAKE 1 CAPSULE BY MOUTH 30 MINUTES PRIOR TO YOUR FIRST MEAL. (Patient taking differently: Take 145 mcg by mouth daily as needed (constipation). ), Disp: 30 capsule, Rfl: 11 .  norelgestromin-ethinyl estradiol (XULANE) 150-35 MCG/24HR transdermal patch, APPLY 1 PATCH TOPICALLY ONCE A WEEK, Disp: 3 patch, Rfl: 6 .  pantoprazole (PROTONIX) 20 MG tablet, Take 1 tablet by mouth twice daily, Disp: 60 tablet, Rfl: 1 .  promethazine (PHENERGAN) 25 MG suppository, Place 1 suppository (25 mg total) rectally every 6 (six) hours as needed for nausea or vomiting., Disp: 12 each, Rfl: 0 .  vitamin B-12 (CYANOCOBALAMIN) 500 MCG tablet, Take 500 mcg by mouth daily., Disp: , Rfl:  .  Vitamin D, Ergocalciferol, (DRISDOL) 50000 units CAPS  capsule, Take 1 capsule (50,000 Units total) by mouth every 7 (seven) days. Mondays (Patient taking differently: Take 50,000 Units by mouth every Monday. ), Disp: 30 capsule, Rfl: 6 .  cyclobenzaprine (FLEXERIL) 10 MG tablet, Take 1 tablet (10 mg total) by mouth 3 (three) times daily as needed for muscle spasms. (Patient not taking: Reported on 11/25/2019), Disp: 30 tablet, Rfl: 1  Objective Blood pressure 124/71, pulse 99, height 5\' 6"  (1.676 m), weight 195 lb (88.5 kg), last menstrual period 11/23/2019.  Gen WDWN NAD  Pertinent ROS  No burning with urination, frequency or urgency No nausea, vomiting or diarrhea Nor fever chills or other constitutional symptoms   Labs or studies     Impression Diagnoses this Encounter::   ICD-10-CM   1. Menorrhagia with regular cycle  N92.0 US PELVIS TRANSVAGINAL NON-OB (TV ONLY)    US PELVIS (TRANSABDOMINAL ONLY)  2. Dysmenorrhea  N94.6 US PELVIS TRANSVAGINAL NON-OB (TV ONLY)    US PELVIS (TRANSABDOMINAL ONLY)    Established relevant diagnosis(es):   Plan/Recommendations: No orders of the defined types were placed in this encounter.   Labs or Scans Ordered: Orders Placed This Encounter  Procedures  . US PELVIS TRANSVAGINAL NON-OB (TV ONLY)  . US PELVIS (TRANSABDOMINAL ONLY)    Management:: >songram to evaluate pelvic anatomy  Follow up Return in about 3 weeks (around 01/13/2020) for GYN sono, Follow up, with Dr Elonda Husky.        Face to face time:  15 minutes  Greater than 50% of the visit time was spent in counseling and coordination of care with the patient.  The summary and outline of the counseling and care coordination is summarized in the note above.   All questions were answered.

## 2020-01-17 ENCOUNTER — Other Ambulatory Visit: Payer: Self-pay

## 2020-01-17 ENCOUNTER — Ambulatory Visit: Payer: 59 | Admitting: Obstetrics & Gynecology

## 2020-01-17 ENCOUNTER — Ambulatory Visit (INDEPENDENT_AMBULATORY_CARE_PROVIDER_SITE_OTHER): Payer: 59

## 2020-01-17 ENCOUNTER — Encounter: Payer: Self-pay | Admitting: Obstetrics & Gynecology

## 2020-01-17 VITALS — BP 113/61 | HR 81 | Ht 66.0 in | Wt 191.5 lb

## 2020-01-17 DIAGNOSIS — Z3009 Encounter for other general counseling and advice on contraception: Secondary | ICD-10-CM

## 2020-01-17 DIAGNOSIS — N946 Dysmenorrhea, unspecified: Secondary | ICD-10-CM

## 2020-01-17 DIAGNOSIS — N920 Excessive and frequent menstruation with regular cycle: Secondary | ICD-10-CM

## 2020-01-17 NOTE — Progress Notes (Signed)
PELVIC US TA/TV:anteverted/retroflexed uterus,wnl,normal ovaries,EEC 8.8 mm,unable to slide left ovary,no free fluid,no pain during ultrasound  Chaperone Peggy

## 2020-01-17 NOTE — Progress Notes (Signed)
Patient ID: Mary Moses, female   DOB: [birth year], 34 y.o.   MRN: [id number] Follow up appointment for results  Chief Complaint  Patient presents with  . discuss [place] results    Blood pressure 113/61, pulse 81, height 5\' 6"  (1.676 m), weight 191 lb 8 oz (86.9 kg), last menstrual period 01/04/2020.  US PELVIS TRANSVAGINAL NON-OB (TV ONLY)  Result Date: 01/17/2020 GYNECOLOGIC SONOGRAM Mary Moses is a 34 y.o. G1P1001 Patient's last menstrual period was 01/04/2020. She is here for a pelvic sonogram for menorrhagia,dysmenorrhea. Uterus                      8.7 x 3.3 x 4.2 cm, Total uterine volume 64 cc,anteverted/retroflexed uterus,wnl Endometrium          8.8 mm, symmetrical, wnl Right ovary             2.5 x 1.6 x 2.1 cm, wnl Left ovary                2.6 x 2.8 x 2.7 cm, wnl,unable to slide left ovary No free fluid Technician Comments: PELVIC US TA/TV:anteverted/retroflexed uterus,wnl,normal ovaries,EEC 8.8 mm,unable to slide left ovary,no free fluid,no pain during ultrasound Chaperone [address] Heide Guile 01/17/2020 9:53 AM Clinical Impression and recommendations: I have reviewed the sonogram results above, combined with the patient's current clinical course, below are my impressions and any appropriate recommendations for management based on the sonographic findings. Uterus is small normal shape no pathology Endometrium normal Both ovaries are normal Florian Buff 01/17/2020 10:39 AM  US PELVIS (TRANSABDOMINAL ONLY)  Result Date: 01/17/2020 GYNECOLOGIC SONOGRAM Mary Moses is a 34 y.o. G1P1001 Patient's last menstrual period was 01/04/2020. She is here for a pelvic sonogram for menorrhagia,dysmenorrhea. Uterus                      8.7 x 3.3 x 4.2 cm, Total uterine volume 64 cc,anteverted/retroflexed uterus,wnl Endometrium          8.8 mm, symmetrical, wnl Right ovary             2.5 x 1.6 x 2.1 cm, wnl Left ovary                2.6 x 2.8 x 2.7 cm, wnl,unable to slide left ovary No free  fluid Technician Comments: PELVIC US TA/TV:anteverted/retroflexed uterus,wnl,normal ovaries,EEC 8.8 mm,unable to slide left ovary,no free fluid,no pain during ultrasound Chaperone [address] Heide Guile 01/17/2020 9:53 AM Clinical Impression and recommendations: I have reviewed the sonogram results above, combined with the patient's current clinical course, below are my impressions and any appropriate recommendations for management based on the sonographic findings. Uterus is small normal shape no pathology Endometrium normal Both ovaries are normal Florian Buff 01/17/2020 10:39 AM     MEDS ordered this encounter: No orders of the defined types were placed in this encounter.   Orders for this encounter: No orders of the defined types were placed in this encounter.   Impression:   ICD-10-CM   1. Menorrhagia with regular cycle  N92.0   2. Dysmenorrhea  N94.6   3. Sterilization consult  Z30.09      Plan: Hysteroscopy uterine curettage endometrial ablation using Minerva and laparoscopic salpingectomy for the purpose of sterilization  3/31  Follow Up: Return in about 5 weeks (around 02/24/2020) for Daisy, with Dr Elonda Husky.     All questions were answered.  Past Medical History:  Diagnosis Date  . Abdominal bloating 09/16/2015  . Abnormal Pap smear   . Chiari I malformation (HCC)   . Cramps, extremity 09/16/2015  . Drug induced amenorrhea 09/16/2015   Has nexplanon  . GERD (gastroesophageal reflux disease)   . Hypokalemia   . Nexplanon in place 09/16/2015  . Nexplanon insertion 07/09/2013   nexplanon inserted 8/26 left arm remove 07/09/16  . Obesity   . Pregnancy   . Swelling 06/19/2013  . Vaginal Pap smear, abnormal     Past Surgical History:  Procedure Laterality Date  . CESAREAN SECTION N/A 06/08/2013   Procedure: Primary CESAREAN SECTION of baby girl  at 2056 APGAR 8/9;  Surgeon: Tereso Newcomer, MD;  Location: [facility];  Service: Obstetrics;  Laterality: N/A;  . SUBOCCIPITAL  CRANIECTOMY CERVICAL LAMINECTOMY  01/24/2019   Procedure: Suboccipital craniectomy/cervical laminectomy/duraplasty;  Surgeon: Tressie Stalker, MD;  Location: [facility] OR;  Service: Neurosurgery;;    OB History    Gravida  1   Para  1   Term  1   Preterm      AB      Living  1     SAB      TAB      Ectopic      Multiple      Live Births  1           Allergies  Allergen Reactions  . Penicillins Nausea And Vomiting    Has patient had a PCN reaction causing immediate rash, facial/tongue/throat swelling, SOB or lightheadedness with hypotension: no Has patient had a PCN reaction causing severe rash involving mucus membranes or skin necrosis: no Has patient had a PCN reaction that required hospitalization no Has patient had a PCN reaction occurring within the last 10 years: no If all of the above answers are "NO", then may proceed with Cephalosporin   . Tramadol Hcl Nausea And Vomiting    Social History   Socioeconomic History  . Marital status: Single    Spouse name: Not on file  . Number of children: Not on file  . Years of education: Not on file  . Highest education level: Not on file  Occupational History  . Not on file  Tobacco Use  . Smoking status: Never Smoker  . Smokeless tobacco: Never Used  Substance and Sexual Activity  . Alcohol use: No  . Drug use: No  . Sexual activity: Yes    Birth control/protection: Patch  Other Topics Concern  . Not on file  Social History Narrative  . Not on file   Social Determinants of Health   Financial Resource Strain:   . Difficulty of Paying Living Expenses: Not on file  Food Insecurity:   . Worried About Programme researcher, broadcasting/film/video in the Last Year: Not on file  . Ran Out of Food in the Last Year: Not on file  Transportation Needs:   . Lack of Transportation (Medical): Not on file  . Lack of Transportation (Non-Medical): Not on file  Physical Activity:   . Days of Exercise per Week: Not on file  . Minutes of  Exercise per Session: Not on file  Stress:   . Feeling of Stress : Not on file  Social Connections:   . Frequency of Communication with Friends and Family: Not on file  . Frequency of Social Gatherings with Friends and Family: Not on file  . Attends Religious Services: Not on file  . Active Member of Clubs or Organizations: Not  on file  . Attends Banker Meetings: Not on file  . Marital Status: Not on file    Family History  Problem Relation Age of Onset  . Kidney disease Mother   . Heart disease Mother   . Hypertension Mother   . Cancer Father        metastatic prostate   . Hypertension Sister

## 2020-02-07 NOTE — Patient Instructions (Addendum)
Mary Moses  02/07/2020     @   Your procedure is scheduled on  02/12/2020  [name]  A.M.  Call this number if you have problems the morning of surgery:  [phone]   Remember:  Do not eat or drink after midnight.                        Take these medicines the morning of surgery with A SIP OF WATER Protonix.    Do not wear jewelry, make-up or nail polish.  Do not wear lotions, powders, or perfumes. Please wear deodorant and brush your teeth.  Do not shave 48 hours prior to surgery.  Men may shave face and neck.  Do not bring valuables to the hospital.  [facility] is not responsible for any belongings or valuables.  Contacts, dentures or bridgework may not be worn into surgery.  Leave your suitcase in the car.  After surgery it may be brought to your room.  For patients admitted to the hospital, discharge time will be determined by your treatment team.  Patients discharged the day of surgery will not be allowed to drive home.   Name and phone number of your driver:   family Special instructions:  DO NOT smoke the morning of your procedure.   STOP your phentermine until after your surgery.  Please read over the following fact sheets that you were given. Anesthesia Post-op Instructions and Care and Recovery After Surgery       Endometrial Ablation Endometrial ablation is a procedure that destroys the thin inner layer of the lining of the uterus (endometrium). This procedure may be done:  To stop heavy periods.  To stop bleeding that is causing anemia.  To control irregular bleeding.  To treat bleeding caused by small tumors (fibroids) in the endometrium. This procedure is often an alternative to major surgery, such as removal of the uterus and cervix (hysterectomy). As a result of this procedure:  You may not be able to have children. However, if you are premenopausal (you have not gone through  menopause): ? You may still have a small chance of getting pregnant. ? You will need to use a reliable method of birth control after the procedure to prevent pregnancy.  You may stop having a menstrual period, or you may have only a small amount of bleeding during your period. Menstruation may return several years after the procedure. Tell a health care provider about:  Any allergies you have.  All medicines you are taking, including vitamins, herbs, eye drops, creams, and over-the-counter medicines.  Any problems you or family members have had with the use of anesthetic medicines.  Any blood disorders you have.  Any surgeries you have had.  Any medical conditions you have. What are the risks? Generally, this is a safe procedure. However, problems may occur, including:  A hole (perforation) in the uterus or bowel.  Infection of the uterus, bladder, or vagina.  Bleeding.  Damage to other structures or organs.  An air bubble in the lung (air embolus).  Problems with pregnancy after the procedure.  Failure of the procedure.  Decreased ability to diagnose cancer in the endometrium. What happens before the procedure?  You will have tests of your endometrium to make sure there are no pre-cancerous cells or cancer cells present.  You may have an ultrasound  of the uterus.  You may be given medicines to thin the endometrium.  Ask your health care provider about: ? Changing or stopping your regular medicines. This is especially important if you take diabetes medicines or blood thinners. ? Taking medicines such as aspirin and ibuprofen. These medicines can thin your blood. Do not take these medicines before your procedure if your doctor tells you not to.  Plan to have someone take you home from the hospital or clinic. What happens during the procedure?   You will lie on an exam table with your feet and legs supported as in a pelvic exam.  To lower your risk of  infection: ? Your health care team will wash or sanitize their hands and put on germ-free (sterile) gloves. ? Your genital area will be washed with soap.  An IV tube will be inserted into one of your veins.  You will be given a medicine to help you relax (sedative).  A surgical instrument with a light and camera (resectoscope) will be inserted into your vagina and moved into your uterus. This allows your surgeon to see inside your uterus.  Endometrial tissue will be removed using one of the following methods: ? Radiofrequency. This method uses a radiofrequency-alternating electric current to remove the endometrium. ? Cryotherapy. This method uses extreme cold to freeze the endometrium. ? Heated-free liquid. This method uses a heated saltwater (saline) solution to remove the endometrium. ? Microwave. This method uses high-energy microwaves to heat up the endometrium and remove it. ? Thermal balloon. This method involves inserting a catheter with a balloon tip into the uterus. The balloon tip is filled with heated fluid to remove the endometrium. The procedure may vary among health care providers and hospitals. What happens after the procedure?  Your blood pressure, heart rate, breathing rate, and blood oxygen level will be monitored until the medicines you were given have worn off.  As tissue healing occurs, you may notice vaginal bleeding for 4-6 weeks after the procedure. You may also experience: ? Cramps. ? Thin, watery vaginal discharge that is light pink or brown in color. ? A need to urinate more frequently than usual. ? Nausea.  Do not drive for 24 hours if you were given a sedative.  Do not have sex or insert anything into your vagina until your health care provider approves. Summary  Endometrial ablation is done to treat the many causes of heavy menstrual bleeding.  The procedure may be done only after medications have been tried to control the bleeding.  Plan to have  someone take you home from the hospital or clinic. This information is not intended to replace advice given to you by your health care provider. Make sure you discuss any questions you have with your health care provider. Document Revised: 04/17/2018 Document Reviewed: 11/17/2016 Elsevier Patient Education  2020 [employer]  Hysteroscopy, Care After This sheet gives you information about how to care for yourself after your procedure. Your health care provider may also give you more specific instructions. If you have problems or questions, contact your health care provider. What can I expect after the procedure? After the procedure, it is common to have:  Cramping.  Bleeding. This can vary from light spotting to menstrual-like bleeding. Follow these instructions at home: Activity  Rest for 1-2 days after the procedure.  Do not douche, use tampons, or have sex for 2 weeks after the procedure, or until your health care provider approves.  Do not drive for 24  hours after the procedure, or for as long as told by your health care provider.  Do not drive, use heavy machinery, or drink alcohol while taking prescription pain medicines. Medicines   Take over-the-counter and prescription medicines only as told by your health care provider.  Do not take aspirin during recovery. It can increase the risk of bleeding. General instructions  Do not take baths, swim, or use a hot tub until your health care provider approves. Take showers instead of baths for 2 weeks, or for as long as told by your health care provider.  To prevent or treat constipation while you are taking prescription pain medicine, your health care provider may recommend that you: ? Drink enough fluid to keep your urine clear or pale yellow. ? Take over-the-counter or prescription medicines. ? Eat foods that are high in fiber, such as fresh fruits and vegetables, whole grains, and beans. ? Limit foods that are high in fat and  processed sugars, such as fried and sweet foods.  Keep all follow-up visits as told by your health care provider. This is important. Contact a health care provider if:  You feel dizzy or lightheaded.  You feel nauseous.  You have abnormal vaginal discharge.  You have a rash.  You have pain that does not get better with medicine.  You have chills. Get help right away if:  You have bleeding that is heavier than a normal menstrual period.  You have a fever.  You have pain or cramps that get worse.  You develop new abdominal pain.  You faint.  You have pain in your shoulders.  You have shortness of breath. Summary  After the procedure, you may have cramping and some vaginal bleeding.  Do not douche, use tampons, or have sex for 2 weeks after the procedure, or until your health care provider approves.  Do not take baths, swim, or use a hot tub until your health care provider approves. Take showers instead of baths for 2 weeks, or for as long as told by your health care provider.  Report any unusual symptoms to your health care provider.  Keep all follow-up visits as told by your health care provider. This is important. This information is not intended to replace advice given to you by your health care provider. Make sure you discuss any questions you have with your health care provider. Document Revised: 10/13/2017 Document Reviewed: 11/29/2016 Elsevier Patient Education  2020 Else[employer] Inc.  Dilation and Curettage or Vacuum Curettage, Care After These instructions give you information about caring for yourself after your procedure. Your doctor may also give you more specific instructions. Call your doctor if you have any problems or questions after your procedure. Follow these instructions at home: Activity  Do not drive or use heavy machinery while taking prescription pain medicine.  For 24 hours after your procedure, avoid driving.  Take short walks often,  followed by rest periods. Ask your doctor what activities are safe for you. After one or two days, you may be able to return to your normal activities.  Do not lift anything that is heavier than 10 lb (4.5 kg) until your doctor approves.  For at least 2 weeks, or as long as told by your doctor: ? Do not douche. ? Do not use tampons. ? Do not have sex. General instructions   Take over-the-counter and prescription medicines only as told by your doctor. This is very important if you take blood thinning medicine.  Do not take  baths, swim, or use a hot tub until your doctor approves. Take showers instead of baths.  Wear compression stockings as told by your doctor.  It is up to you to get the results of your procedure. Ask your doctor when your results will be ready.  Keep all follow-up visits as told by your doctor. This is important. Contact a doctor if:  You have very bad cramps that get worse or do not get better with medicine.  You have very bad pain in your belly (abdomen).  You cannot drink fluids without throwing up (vomiting).  You get pain in a different part of the area between your belly and thighs (pelvis).  You have bad-smelling discharge from your vagina.  You have a rash. Get help right away if:  You are bleeding a lot from your vagina. A lot of bleeding means soaking more than one sanitary pad in an hour, for 2 hours in a row.  You have clumps of blood (blood clots) coming from your vagina.  You have a fever or chills.  Your belly feels very tender or hard.  You have chest pain.  You have trouble breathing.  You cough up blood.  You feel dizzy.  You feel light-headed.  You pass out (faint).  You have pain in your neck or shoulder area. Summary  Take short walks often, followed by rest periods. Ask your doctor what activities are safe for you. After one or two days, you may be able to return to your normal activities.  Do not lift anything that  is heavier than 10 lb (4.5 kg) until your doctor approves.  Do not take baths, swim, or use a hot tub until your doctor approves. Take showers instead of baths.  Contact your doctor if you have any symptoms of infection, like bad-smelling discharge from your vagina. This information is not intended to replace advice given to you by your health care provider. Make sure you discuss any questions you have with your health care provider. Document Revised: 10/13/2017 Document Reviewed: 07/18/2016 Elsevier Patient Education  2020 [employer].  Salpingectomy, Care After This sheet gives you information about how to care for yourself after your procedure. Your health care provider may also give you more specific instructions. If you have problems or questions, contact your health care provider. What can I expect after the procedure? After the procedure, it is common to have:  Pain in your abdomen.  Some light vaginal bleeding (spotting) for a few days.  Tiredness. Your recovery time will vary depending on which method your surgeon used for your surgery. Follow these instructions at home: Incision care   Follow instructions from your health care provider about how to take care of your incisions. Make sure you: ? Wash your hands with soap and water before and after you change your bandage (dressing). If soap and water are not available, use hand sanitizer. ? Change or remove your dressing as told by your health care provider. ? Leave any stitches (sutures), skin glue, or adhesive strips in place. These skin closures may need to stay in place for 2 weeks or longer. If adhesive strip edges start to loosen and curl up, you may trim the loose edges. Do not remove adhesive strips completely unless your health care provider tells you to do that.  Keep your dressing clean and dry.  Check your incision area every day for signs of infection. Check for: ? Redness, swelling, or pain that gets  worse. ?  Fluid or blood. ? Warmth. ? Pus or a bad smell. Activity  Rest as told by your health care provider.  Avoid sitting for a long time without moving. Get up to take short walks every 1-2 hours. This is important to improve blood flow and breathing. Ask for help if you feel weak or unsteady.  Return to your normal activities as told by your health care provider. Ask your health care provider what activities are safe for you.  Do not drive until your health care provider says that it is safe.  Do not lift anything that is heavier than 10 lb (4.5 kg), or the limit that you are told, until your health care provider says that it is safe. This may be 2-6 weeks depending on your surgery.  Until your health care provider approves: ? Do not douche. ? Do not use tampons. ? Do not have sex. Medicines  Take over-the-counter and prescription medicines only as told by your health care provider.  Ask your health care provider if the medicine prescribed to you: ? Requires you to avoid driving or using heavy machinery. ? Can cause constipation. You may need to take actions to prevent or treat constipation, such as:  Drink enough fluid to keep your urine pale yellow.  Take over-the-counter or prescription medicines.  Eat foods that are high in fiber, such as beans, whole grains, and fresh fruits and vegetables.  Limit foods that are high in fat and processed sugars, such as fried or sweet foods. General instructions  Wear compression stockings as told by your health care provider. These stockings help to prevent blood clots and reduce swelling in your legs.  Do not use any products that contain nicotine or tobacco, such as cigarettes, e-cigarettes, and chewing tobacco. If you need help quitting, ask your health care provider.  Do not take baths, swim, or use a hot tub until your health care provider approves. You may take showers.  Keep all follow-up visits as told by your health care  provider. This is important. Contact a health care provider if you have:  Pain when you urinate.  Redness, swelling, or pain around an incision.  Fluid or blood coming from an incision.  Pus or a bad smell coming from an incision.  An incision that feels warm to the touch.  A fever.  Abdominal pain that gets worse or does not get better with medicine.  An incision that starts to break open.  A rash.  Light-headedness.  Nausea and vomiting. Get help right away if you:  Have pain in your chest or leg.  Develop shortness of breath.  Faint.  Have increased or heavy vaginal bleeding, such as soaking a pad in an hour. Summary  After the procedure, it is common to feel tired, have some pain in your abdomen, and have some light vaginal bleeding for a few days.  Follow instructions from your health care provider about how to take care of your incisions.  Return to your normal activities as told by your health care provider. Ask your health care provider what activities are safe for you.  Do not douche, use tampons, or have sex until your health care provider approves.  Keep all follow-up visits as told by your health care provider. This information is not intended to replace advice given to you by your health care provider. Make sure you discuss any questions you have with your health care provider. Document Revised: 10/22/2018 Document Reviewed: 10/22/2018 Elsevier Patient Education  [place]. How to Use Chlorhexidine for Bathing Chlorhexidine gluconate (CHG) is a germ-killing (antiseptic) solution that is used to clean the skin. It can get rid of the bacteria that normally live on the skin and can keep them away for about 24 hours. To clean your skin with CHG, you may be given:  A CHG solution to use in the shower or as part of a sponge bath.  A prepackaged cloth that contains CHG. Cleaning your skin with CHG may help lower the risk for infection:  While you  are staying in the intensive care unit of the hospital.  If you have a vascular access, such as a central line, to provide short-term or long-term access to your veins.  If you have a catheter to drain urine from your bladder.  If you are on a ventilator. A ventilator is a machine that helps you breathe by moving air in and out of your lungs.  After surgery. What are the risks? Risks of using CHG include:  A skin reaction.  Hearing loss, if CHG gets in your ears.  Eye injury, if CHG gets in your eyes and is not rinsed out.  The CHG product catching fire. Make sure that you avoid smoking and flames after applying CHG to your skin. Do not use CHG:  If you have a chlorhexidine allergy or have previously reacted to chlorhexidine.  On babies younger than 40 months of age. How to use CHG solution  Use CHG only as told by your health care provider, and follow the instructions on the label.  Use the full amount of CHG as directed. Usually, this is one bottle. During a shower Follow these steps when using CHG solution during a shower (unless your health care provider gives you different instructions): 1. Start the shower. 2. Use your normal soap and shampoo to wash your face and hair. 3. Turn off the shower or move out of the shower stream. 4. Pour the CHG onto a clean washcloth. Do not use any type of brush or rough-edged sponge. 5. Starting at your neck, lather your body down to your toes. Make sure you follow these instructions: ? If you will be having surgery, pay special attention to the part of your body where you will be having surgery. Scrub this area for at least 1 minute. ? Do not use CHG on your head or face. If the solution gets into your ears or eyes, rinse them well with water. ? Avoid your genital area. ? Avoid any areas of skin that have broken skin, cuts, or scrapes. ? Scrub your back and under your arms. Make sure to wash skin folds. 6. Let the lather sit on your  skin for 1-2 minutes or as long as told by your health care provider. 7. Thoroughly rinse your entire body in the shower. Make sure that all body creases and crevices are rinsed well. 8. Dry off with a clean towel. Do not put any substances on your body afterward--such as powder, lotion, or perfume--unless you are told to do so by your health care provider. Only use lotions that are recommended by the manufacturer. 9. Put on clean clothes or pajamas. 10. If it is the night before your surgery, sleep in clean sheets.  During a sponge bath Follow these steps when using CHG solution during a sponge bath (unless your health care provider gives you different instructions): 1. Use your normal soap and shampoo to wash your face and hair. 2.  Pour the CHG onto a clean washcloth. 3. Starting at your neck, lather your body down to your toes. Make sure you follow these instructions: ? If you will be having surgery, pay special attention to the part of your body where you will be having surgery. Scrub this area for at least 1 minute. ? Do not use CHG on your head or face. If the solution gets into your ears or eyes, rinse them well with water. ? Avoid your genital area. ? Avoid any areas of skin that have broken skin, cuts, or scrapes. ? Scrub your back and under your arms. Make sure to wash skin folds. 4. Let the lather sit on your skin for 1-2 minutes or as long as told by your health care provider. 5. Using a different clean, wet washcloth, thoroughly rinse your entire body. Make sure that all body creases and crevices are rinsed well. 6. Dry off with a clean towel. Do not put any substances on your body afterward--such as powder, lotion, or perfume--unless you are told to do so by your health care provider. Only use lotions that are recommended by the manufacturer. 7. Put on clean clothes or pajamas. 8. If it is the night before your surgery, sleep in clean sheets. How to use CHG prepackaged  cloths  Only use CHG cloths as told by your health care provider, and follow the instructions on the label.  Use the CHG cloth on clean, dry skin.  Do not use the CHG cloth on your head or face unless your health care provider tells you to.  When washing with the CHG cloth: ? Avoid your genital area. ? Avoid any areas of skin that have broken skin, cuts, or scrapes. Before surgery Follow these steps when using a CHG cloth to clean before surgery (unless your health care provider gives you different instructions): 1. Using the CHG cloth, vigorously scrub the part of your body where you will be having surgery. Scrub using a back-and-forth motion for 3 minutes. The area on your body should be completely wet with CHG when you are done scrubbing. 2. Do not rinse. Discard the cloth and let the area air-dry. Do not put any substances on the area afterward, such as powder, lotion, or perfume. 3. Put on clean clothes or pajamas. 4. If it is the night before your surgery, sleep in clean sheets.  For general bathing Follow these steps when using CHG cloths for general bathing (unless your health care provider gives you different instructions). 1. Use a separate CHG cloth for each area of your body. Make sure you wash between any folds of skin and between your fingers and toes. Wash your body in the following order, switching to a new cloth after each step: ? The front of your neck, shoulders, and chest. ? Both of your arms, under your arms, and your hands. ? Your stomach and groin area, avoiding the genitals. ? Your right leg and foot. ? Your left leg and foot. ? The back of your neck, your back, and your buttocks. 2. Do not rinse. Discard the cloth and let the area air-dry. Do not put any substances on your body afterward--such as powder, lotion, or perfume--unless you are told to do so by your health care provider. Only use lotions that are recommended by the manufacturer. 3. Put on clean clothes  or pajamas. Contact a health care provider if:  Your skin gets irritated after scrubbing.  You have questions about using your solution  or cloth. Get help right away if:  Your eyes become very red or swollen.  Your eyes itch badly.  Your skin itches badly and is red or swollen.  Your hearing changes.  You have trouble seeing.  You have swelling or tingling in your mouth or throat.  You have trouble breathing.  You swallow any chlorhexidine. Summary  Chlorhexidine gluconate (CHG) is a germ-killing (antiseptic) solution that is used to clean the skin. Cleaning your skin with CHG may help to lower your risk for infection.  You may be given CHG to use for bathing. It may be in a bottle or in a prepackaged cloth to use on your skin. Carefully follow your health care provider's instructions and the instructions on the product label.  Do not use CHG if you have a chlorhexidine allergy.  Contact your health care provider if your skin gets irritated after scrubbing. This information is not intended to replace advice given to you by your health care provider. Make sure you discuss any questions you have with your health care provider. Document Revised: 01/17/2019 Document Reviewed: 09/28/2017 Elsevier Patient Education  2020 [employer].

## 2020-02-10 ENCOUNTER — Encounter (HOSPITAL_COMMUNITY): Payer: Self-pay

## 2020-02-10 ENCOUNTER — Encounter (HOSPITAL_COMMUNITY)
Admission: RE | Admit: 2020-02-10 | Discharge: 2020-02-10 | Disposition: A | Discharge status: Home / Self Care | Payer: 59 | Source: Ambulatory Visit | Attending: Obstetrics & Gynecology | Admitting: Obstetrics & Gynecology

## 2020-02-10 ENCOUNTER — Other Ambulatory Visit (HOSPITAL_COMMUNITY)
Admission: RE | Admit: 2020-02-10 | Discharge: 2020-02-10 | Disposition: A | Discharge status: Home / Self Care | Payer: 59 | Source: Ambulatory Visit | Attending: Obstetrics & Gynecology | Admitting: Obstetrics & Gynecology

## 2020-02-10 ENCOUNTER — Other Ambulatory Visit: Payer: Self-pay

## 2020-02-10 ENCOUNTER — Other Ambulatory Visit: Payer: Self-pay | Admitting: Obstetrics & Gynecology

## 2020-02-10 DIAGNOSIS — Z01812 Encounter for preprocedural laboratory examination: Secondary | ICD-10-CM | POA: Diagnosis not present

## 2020-02-10 DIAGNOSIS — Z20822 Contact with and (suspected) exposure to covid-19: Secondary | ICD-10-CM | POA: Diagnosis not present

## 2020-02-10 LAB — CBC
HCT: 39.5 % (ref 36.0–46.0)
Hemoglobin: 12.2 g/dL (ref 12.0–15.0)
MCH: 27.6 pg (ref 26.0–34.0)
MCHC: 30.9 g/dL (ref 30.0–36.0)
MCV: 89.4 fL (ref 80.0–100.0)
Platelets: 238 10*3/uL (ref 150–400)
RBC: 4.42 MIL/uL (ref 3.87–5.11)
RDW: 14 % (ref 11.5–15.5)
WBC: 7.7 10*3/uL (ref 4.0–10.5)
nRBC: 0 % (ref 0.0–0.2)

## 2020-02-10 LAB — COMPREHENSIVE METABOLIC PANEL
ALT: 15 U/L (ref 0–44)
AST: 16 U/L (ref 15–41)
Albumin: 3.4 g/dL — ABNORMAL LOW (ref 3.5–5.0)
Alkaline Phosphatase: 44 U/L (ref 38–126)
Anion gap: 8 (ref 5–15)
BUN: 10 mg/dL (ref 6–20)
CO2: 25 mmol/L (ref 22–32)
Calcium: 8.5 mg/dL — ABNORMAL LOW (ref 8.9–10.3)
Chloride: 102 mmol/L (ref 98–111)
Creatinine, Ser: 0.89 mg/dL (ref 0.44–1.00)
GFR calc Af Amer: >60 mL/min (ref >60)
GFR calc non Af Amer: >60 mL/min (ref >60)
Glucose, Bld: 82 mg/dL (ref 70–99)
Potassium: 3.9 mmol/L (ref 3.5–5.1)
Sodium: 135 mmol/L (ref 135–145)
Total Bilirubin: 0.2 mg/dL — ABNORMAL LOW (ref 0.3–1.2)
Total Protein: 7 g/dL (ref 6.5–8.1)

## 2020-02-10 LAB — RAPID HIV SCREEN (HIV 1/2 AB+AG)
HIV 1/2 Antibodies: NONREACTIVE
HIV-1 P24 Antigen - HIV24: NONREACTIVE

## 2020-02-10 LAB — HCG, QUANTITATIVE, PREGNANCY: hCG, Beta Chain, Quant, S: <1 m[IU]/mL (ref <5)

## 2020-02-10 LAB — SARS CORONAVIRUS 2 (TAT 6-24 HRS): SARS Coronavirus 2: NEGATIVE

## 2020-02-12 ENCOUNTER — Ambulatory Visit (HOSPITAL_COMMUNITY): Payer: 59 | Admitting: Anesthesiology

## 2020-02-12 ENCOUNTER — Ambulatory Visit (HOSPITAL_COMMUNITY)
Admission: RE | Admit: 2020-02-12 | Discharge: 2020-02-12 | Disposition: A | Discharge status: Home / Self Care | Payer: 59 | Attending: Obstetrics & Gynecology | Admitting: Obstetrics & Gynecology

## 2020-02-12 ENCOUNTER — Encounter (HOSPITAL_COMMUNITY)
Admission: RE | Disposition: A | Discharge status: Home / Self Care | Payer: Self-pay | Source: Home / Self Care | Attending: Obstetrics & Gynecology

## 2020-02-12 DIAGNOSIS — N946 Dysmenorrhea, unspecified: Secondary | ICD-10-CM | POA: Insufficient documentation

## 2020-02-12 DIAGNOSIS — Z302 Encounter for sterilization: Secondary | ICD-10-CM

## 2020-02-12 DIAGNOSIS — N921 Excessive and frequent menstruation with irregular cycle: Secondary | ICD-10-CM | POA: Insufficient documentation

## 2020-02-12 DIAGNOSIS — E876 Hypokalemia: Secondary | ICD-10-CM | POA: Insufficient documentation

## 2020-02-12 DIAGNOSIS — K219 Gastro-esophageal reflux disease without esophagitis: Secondary | ICD-10-CM | POA: Diagnosis not present

## 2020-02-12 DIAGNOSIS — Z6831 Body mass index (BMI) 31.0-31.9, adult: Secondary | ICD-10-CM | POA: Diagnosis not present

## 2020-02-12 DIAGNOSIS — E669 Obesity, unspecified: Secondary | ICD-10-CM | POA: Insufficient documentation

## 2020-02-12 DIAGNOSIS — G709 Myoneural disorder, unspecified: Secondary | ICD-10-CM | POA: Diagnosis not present

## 2020-02-12 HISTORY — PX: LAPAROSCOPIC BILATERAL SALPINGECTOMY: SHX5889

## 2020-02-12 HISTORY — PX: DILATATION AND CURETTAGE/HYSTEROSCOPY WITH MINERVA: SHX6851

## 2020-02-12 SURGERY — DILATATION AND CURETTAGE/HYSTEROSCOPY WITH MINERVA
Anesthesia: General

## 2020-02-12 MED ORDER — ROCURONIUM BROMIDE 50 MG/5ML IV SOSY
PREFILLED_SYRINGE | INTRAVENOUS | Status: DC | PRN
  Administered 2020-02-12: 40 mg via INTRAVENOUS

## 2020-02-12 MED ORDER — HYDROMORPHONE HCL 1 MG/ML IJ SOLN
0.2500 mg | INTRAMUSCULAR | Status: DC | PRN
  Administered 2020-02-12 (×2): 0.5 mg via INTRAVENOUS
  Filled 2020-02-12 (×2): qty 0.5

## 2020-02-12 MED ORDER — ONDANSETRON HCL 4 MG/2ML IJ SOLN: 4.0000 mg | Freq: Once | INTRAMUSCULAR | Status: DC | PRN

## 2020-02-12 MED ORDER — SODIUM CHLORIDE 0.9 % IR SOLN
Status: DC | PRN
  Administered 2020-02-12: 3000 mL

## 2020-02-12 MED ORDER — MIDAZOLAM HCL 5 MG/5ML IJ SOLN
INTRAMUSCULAR | Status: DC | PRN
  Administered 2020-02-12 (×2): 1 mg via INTRAVENOUS

## 2020-02-12 MED ORDER — PROPOFOL 10 MG/ML IV BOLUS
INTRAVENOUS | Status: AC
  Filled 2020-02-12: qty 20

## 2020-02-12 MED ORDER — MEPERIDINE HCL 50 MG/ML IJ SOLN: 6.2500 mg | INTRAMUSCULAR | Status: DC | PRN

## 2020-02-12 MED ORDER — FENTANYL CITRATE (PF) 250 MCG/5ML IJ SOLN
INTRAMUSCULAR | Status: AC
  Filled 2020-02-12: qty 5

## 2020-02-12 MED ORDER — 0.9 % SODIUM CHLORIDE (POUR BTL) OPTIME
TOPICAL | Status: DC | PRN
  Administered 2020-02-12: 1000 mL

## 2020-02-12 MED ORDER — LACTATED RINGERS IV SOLN: INTRAVENOUS | Status: DC | PRN

## 2020-02-12 MED ORDER — MIDAZOLAM HCL 2 MG/2ML IJ SOLN
INTRAMUSCULAR | Status: AC
  Filled 2020-02-12: qty 2

## 2020-02-12 MED ORDER — MIDAZOLAM HCL 2 MG/2ML IJ SOLN
2.0000 mg | Freq: Once | INTRAMUSCULAR | Status: AC
  Administered 2020-02-12: 2 mg via INTRAVENOUS

## 2020-02-12 MED ORDER — CEFAZOLIN SODIUM-DEXTROSE 2-4 GM/100ML-% IV SOLN
2.0000 g | INTRAVENOUS | Status: AC
  Administered 2020-02-12: 2 g via INTRAVENOUS
  Filled 2020-02-12: qty 100

## 2020-02-12 MED ORDER — BUPIVACAINE LIPOSOME 1.3 % IJ SUSP
INTRAMUSCULAR | Status: DC | PRN
  Administered 2020-02-12: 20 mL

## 2020-02-12 MED ORDER — BUPIVACAINE LIPOSOME 1.3 % IJ SUSP
20.0000 mL | Freq: Once | INTRAMUSCULAR | Status: DC
  Filled 2020-02-12: qty 20

## 2020-02-12 MED ORDER — PROPOFOL 10 MG/ML IV BOLUS
INTRAVENOUS | Status: DC | PRN
  Administered 2020-02-12: 160 mg via INTRAVENOUS

## 2020-02-12 MED ORDER — ONDANSETRON HCL 4 MG/2ML IJ SOLN
INTRAMUSCULAR | Status: DC | PRN
  Administered 2020-02-12: 4 mg via INTRAVENOUS

## 2020-02-12 MED ORDER — BUPIVACAINE LIPOSOME 1.3 % IJ SUSP
INTRAMUSCULAR | Status: AC
  Filled 2020-02-12: qty 20

## 2020-02-12 MED ORDER — HYDROCODONE-ACETAMINOPHEN 5-325 MG PO TABS: 1.0000 | ORAL_TABLET | Freq: Four times a day (QID) | ORAL | 0 refills | Status: DC | PRN

## 2020-02-12 MED ORDER — FENTANYL CITRATE (PF) 100 MCG/2ML IJ SOLN
INTRAMUSCULAR | Status: DC | PRN
  Administered 2020-02-12: 100 ug via INTRAVENOUS
  Administered 2020-02-12: 50 ug via INTRAVENOUS

## 2020-02-12 MED ORDER — LIDOCAINE 2% (20 MG/ML) 5 ML SYRINGE
INTRAMUSCULAR | Status: DC | PRN
  Administered 2020-02-12: 60 mg via INTRAVENOUS

## 2020-02-12 MED ORDER — KETOROLAC TROMETHAMINE 10 MG PO TABS: 10.0000 mg | ORAL_TABLET | Freq: Three times a day (TID) | ORAL | 0 refills | Status: DC | PRN

## 2020-02-12 MED ORDER — KETOROLAC TROMETHAMINE 30 MG/ML IJ SOLN
30.0000 mg | Freq: Once | INTRAMUSCULAR | Status: AC
  Administered 2020-02-12: 30 mg via INTRAVENOUS
  Filled 2020-02-12: qty 1

## 2020-02-12 MED ORDER — ONDANSETRON 8 MG PO TBDP: 8.0000 mg | ORAL_TABLET | Freq: Three times a day (TID) | ORAL | 0 refills | Status: DC | PRN

## 2020-02-12 MED ORDER — DEXAMETHASONE SODIUM PHOSPHATE 10 MG/ML IJ SOLN
INTRAMUSCULAR | Status: DC | PRN
  Administered 2020-02-12: 10 mg via INTRAVENOUS

## 2020-02-12 MED ORDER — LACTATED RINGERS IV SOLN: Freq: Once | INTRAVENOUS | Status: AC

## 2020-02-12 MED ORDER — SUGAMMADEX SODIUM 200 MG/2ML IV SOLN
INTRAVENOUS | Status: DC | PRN
  Administered 2020-02-12: 200 mg via INTRAVENOUS

## 2020-02-12 SURGICAL SUPPLY — 54 items
BAG HAMPER (MISCELLANEOUS) ×4 IMPLANT
BLADE SURG SZ11 CARB STEEL (BLADE) ×4 IMPLANT
CLOTH BEACON ORANGE TIMEOUT ST (SAFETY) ×4 IMPLANT
COVER LIGHT HANDLE STERIS (MISCELLANEOUS) ×8 IMPLANT
COVER MAYO STAND XLG (MISCELLANEOUS) ×8 IMPLANT
COVER WAND RF STERILE (DRAPES) ×8 IMPLANT
DERMABOND ADVANCED (GAUZE/BANDAGES/DRESSINGS) ×2
DERMABOND ADVANCED .7 DNX12 (GAUZE/BANDAGES/DRESSINGS) ×2 IMPLANT
ELECT REM PT RETURN 9FT ADLT (ELECTROSURGICAL) ×4
ELECTRODE REM PT RTRN 9FT ADLT (ELECTROSURGICAL) ×2 IMPLANT
GAUZE 4X4 16PLY RFD (DISPOSABLE) ×12 IMPLANT
GLOVE BIOGEL PI IND STRL 6.5 (GLOVE) ×4 IMPLANT
GLOVE BIOGEL PI IND STRL 7.0 (GLOVE) ×4 IMPLANT
GLOVE BIOGEL PI IND STRL 8 (GLOVE) ×4 IMPLANT
GLOVE BIOGEL PI INDICATOR 6.5 (GLOVE) ×4
GLOVE BIOGEL PI INDICATOR 7.0 (GLOVE) ×4
GLOVE BIOGEL PI INDICATOR 8 (GLOVE) ×4
GLOVE ECLIPSE 8.0 STRL XLNG CF (GLOVE) ×8 IMPLANT
GLOVE SURG SS PI 6.5 STRL IVOR (GLOVE) ×8 IMPLANT
GOWN STRL REUS W/TWL LRG LVL3 (GOWN DISPOSABLE) ×8 IMPLANT
GOWN STRL REUS W/TWL XL LVL3 (GOWN DISPOSABLE) ×8 IMPLANT
HANDPIECE ABLA MINERVA ENDO (MISCELLANEOUS) ×4 IMPLANT
INST SET HYSTEROSCOPY (KITS) ×4 IMPLANT
INST SET LAPROSCOPIC GYN AP (KITS) ×4 IMPLANT
IV NS 1000ML (IV SOLUTION) ×2
IV NS 1000ML BAXH (IV SOLUTION) ×2 IMPLANT
KIT TURNOVER CYSTO (KITS) ×4 IMPLANT
MANIFOLD NEPTUNE II (INSTRUMENTS) ×4 IMPLANT
MARKER SKIN DUAL TIP RULER LAB (MISCELLANEOUS) ×4 IMPLANT
NEEDLE HYPO 18GX1.5 BLUNT FILL (NEEDLE) ×4 IMPLANT
NEEDLE HYPO 22GX1.5 SAFETY (NEEDLE) ×4 IMPLANT
NEEDLE INSUFFLATION 14GA 120MM (NEEDLE) ×4 IMPLANT
NS IRRIG 1000ML POUR BTL (IV SOLUTION) ×4 IMPLANT
PACK BASIC III (CUSTOM PROCEDURE TRAY) ×2
PACK PERI GYN (CUSTOM PROCEDURE TRAY) ×4 IMPLANT
PACK SRG BSC III STRL LF ECLPS (CUSTOM PROCEDURE TRAY) ×2 IMPLANT
PAD ARMBOARD 7.5X6 YLW CONV (MISCELLANEOUS) ×4 IMPLANT
PAD TELFA 3X4 1S STER (GAUZE/BANDAGES/DRESSINGS) ×4 IMPLANT
SET BASIN LINEN APH (SET/KITS/TRAYS/PACK) ×4 IMPLANT
SET IRRIG Y TYPE TUR BLADDER L (SET/KITS/TRAYS/PACK) ×4 IMPLANT
SHEARS HARMONIC ACE PLUS 36CM (ENDOMECHANICALS) ×4 IMPLANT
SHEET LAVH (DRAPES) ×4 IMPLANT
SLEEVE ENDOPATH XCEL 5M (ENDOMECHANICALS) ×4 IMPLANT
SOL ANTI FOG 6CC (MISCELLANEOUS) ×2 IMPLANT
SOLUTION ANTI FOG 6CC (MISCELLANEOUS) ×2
SUT VICRYL 0 UR6 27IN ABS (SUTURE) ×4 IMPLANT
SUT VICRYL AB 3-0 FS1 BRD 27IN (SUTURE) ×4 IMPLANT
SYR 10ML LL (SYRINGE) ×4 IMPLANT
SYR 20ML LL LF (SYRINGE) ×8 IMPLANT
TROCAR ENDO BLADELESS 11MM (ENDOMECHANICALS) ×4 IMPLANT
TROCAR XCEL NON-BLD 5MMX100MML (ENDOMECHANICALS) ×4 IMPLANT
TUBING EVAC SMOKE HEATED PNEUM (TUBING) ×4 IMPLANT
WARMER LAPAROSCOPE (MISCELLANEOUS) ×4 IMPLANT
YANKAUER SUCT BULB TIP 10FT TU (MISCELLANEOUS) ×4 IMPLANT

## 2020-02-12 NOTE — Transfer of Care (Signed)
Immediate Anesthesia Transfer of Care Note  Patient: Mary Moses  Procedure(s) Performed: DILATATION AND CURETTAGE/HYSTEROSCOPY WITH MINERVA  ABLATION (N/A ) LAPAROSCOPIC BILATERAL SALPINGECTOMY (Bilateral )  Patient Location: PACU  Anesthesia Type:General  Level of Consciousness: awake, alert , oriented and patient cooperative  Airway & Oxygen Therapy: Patient Spontanous Breathing and Patient connected to face mask oxygen  Post-op Assessment: Report given to RN and Post -op Vital signs reviewed and stable  Post vital signs: Reviewed and stable  Last Vitals:  Vitals Value Taken Time  BP 124/66 02/12/20 0911  Temp    Pulse 93 02/12/20 0914  Resp 16 02/12/20 0914  SpO2 100 % 02/12/20 0914  Vitals shown include unvalidated device data.  Last Pain:  Vitals:   02/12/20 0659  TempSrc: Oral  PainSc: 0-No pain      Patients Stated Pain Goal: 9 (02/12/20 1610)  Complications: No apparent anesthesia complications

## 2020-02-12 NOTE — Op Note (Signed)
Note: 2 surgeries.  I perform the ablation prior to the salpingectomy to ensure CO2 seal.   Preoperative diagnosis:  1.   Menometrorrhagia                                         2.  Dysmenorrhea   Postoperative diagnoses: Same as above   Procedure: Hysteroscopy, uterine curettage, endometrial ablation using Minerva  Surgeon: Lazaro Arms   Anesthesia: Laryngeal mask airway  Findings: The endometrium was normal. There were no fibroid or other abnormalities.  Description of operation: The patient was taken to the operating room and placed in the supine position. She underwent general anesthesia using the laryngeal mask airway. She was placed in the dorsal lithotomy position and prepped and draped in the usual sterile fashion. A Graves speculum was placed and the anterior cervical lip was grasped with a single-tooth tenaculum. The cervix was dilated serially to allow passage of the hysteroscope. Diagnostic hysteroscopy was performed and was found to be normal. A vigorous uterine curettage was then performed and all tissue sent to pathology for evaluation.  I then proceeded to perform the Minerva endometrial ablation.   The uterus sounded to 8.5 cm The handpiece was attached to the Minerva power source/machine and the handpiece passed the checklist. The array was squeezed down to remove all of the air present.  The array was then place into the endometrial cavity and deployed to a length of 5.5 cm. The handpiece confirmed appropriate width by being in the green portion of the visual dial. The cervical cuff was then inflated to the point the CO2 indicator was in the green. The endometrial integrity check was then performed and integrity sequence was confirmed x 2. The heating was then begun and carried out for a total of 2 minutes(which is standard therapy time). When the plasma cycle was finished,  the cervical cuff was deflated and the array was removed with tissue present on the silicon  membrane. There was appropriate post Minerva bleeding and uterine discharge.     All of the equipment worked well throughout the procedure.  The patient was awakened from anesthesia and taken to the recovery room in good stable condition all counts were correct. She received 2 g of Ancef and 30 mg of Toradol preoperatively. She will be discharged from the recovery room and followed up in the office in 1- 2 weeks.   She can expect 4 weeks of post procedure bloody watery discharge  Lazaro Arms, MD  02/12/2020 8:47 AM   Preoperative Diagnosis:  1.  Multiparous female desires permanent sterilization                                          2.  Elects to have bilateral salpingectomy for ovarian cancer prophylaxis  Postoperative Diagnosis:  Same as above  Procedure:  Laparoscopic Bilateral Salpingectomy for the purpose of permanent sterilization  Surgeon:  Rockne Coons MD  Anaesthesia: general  Findings:  Patient had normal pelvic anatomy and no intraperitoneal abnormalities.  Description of Operation:  Patient was taken to the OR and placed into supine position where she underwent general anaesthesia.   She was placed in the dorsal lithotomy position and prepped and draped in the usual sterile fashion.  An incision was made in the umbilicus and dissection taken down to the rectus fascia. A Veres needle was used to insufflate the periotneal cavity. An 11 mm non bladed video laparoscope trocar was then placed under direct visualization without difficulty.   The above noted findings were observed.   Two additional 5 mm non bladed trocars were placed in the right and left lower quadrants under direct visualization without difficulty.   The Harmonic scalpel was employed and a salpingectomy of both the right and left tubes was performed.   The tubes were removed from the peritoneal cavity and sent to pathology.   There was good hemostasis bilaterally.   The fascia, peritoneum and  subcutaneous tissue were closed using 0 vicryl.   All 3 skin incisions were closed using 3-0 vicryl in a subcuticular fashion.  Exparel 266 mg 20 cc was injected in the 3 incisional/trocar sites.  The patient was awakened from anaesthesia and taken to the PACU with all counts being correct x 3.   The patient received  2 gram of ancef andToradol 30 mg IV preoperatively.  Florian Buff 02/12/2020 8:47 AM

## 2020-02-12 NOTE — Discharge Instructions (Signed)
Endometrial Ablation Endometrial ablation is a procedure that destroys the thin inner layer of the lining of the uterus (endometrium). This procedure may be done:  To stop heavy periods.  To stop bleeding that is causing anemia.  To control irregular bleeding.  To treat bleeding caused by small tumors (fibroids) in the endometrium. This procedure is often an alternative to major surgery, such as removal of the uterus and cervix (hysterectomy). As a result of this procedure:  You may not be able to have children. However, if you are premenopausal (you have not gone through menopause): ? You may still have a small chance of getting pregnant. ? You will need to use a reliable method of birth control after the procedure to prevent pregnancy.  You may stop having a menstrual period, or you may have only a small amount of bleeding during your period. Menstruation may return several years after the procedure. Tell a health care provider about:  Any allergies you have.  All medicines you are taking, including vitamins, herbs, eye drops, creams, and over-the-counter medicines.  Any problems you or family members have had with the use of anesthetic medicines.  Any blood disorders you have.  Any surgeries you have had.  Any medical conditions you have. What are the risks? Generally, this is a safe procedure. However, problems may occur, including:  A hole (perforation) in the uterus or bowel.  Infection of the uterus, bladder, or vagina.  Bleeding.  Damage to other structures or organs.  An air bubble in the lung (air embolus).  Problems with pregnancy after the procedure.  Failure of the procedure.  Decreased ability to diagnose cancer in the endometrium. What happens before the procedure?  You will have tests of your endometrium to make sure there are no pre-cancerous cells or cancer cells present.  You may have an ultrasound of the uterus.  You may be given medicines to  thin the endometrium.  Ask your health care provider about: ? Changing or stopping your regular medicines. This is especially important if you take diabetes medicines or blood thinners. ? Taking medicines such as aspirin and ibuprofen. These medicines can thin your blood. Do not take these medicines before your procedure if your doctor tells you not to.  Plan to have someone take you home from the hospital or clinic. What happens during the procedure?   You will lie on an exam table with your feet and legs supported as in a pelvic exam.  To lower your risk of infection: ? Your health care team will wash or sanitize their hands and put on germ-free (sterile) gloves. ? Your genital area will be washed with soap.  An IV tube will be inserted into one of your veins.  You will be given a medicine to help you relax (sedative).  A surgical instrument with a light and camera (resectoscope) will be inserted into your vagina and moved into your uterus. This allows your surgeon to see inside your uterus.  Endometrial tissue will be removed using one of the following methods: ? Radiofrequency. This method uses a radiofrequency-alternating electric current to remove the endometrium. ? Cryotherapy. This method uses extreme cold to freeze the endometrium. ? Heated-free liquid. This method uses a heated saltwater (saline) solution to remove the endometrium. ? Microwave. This method uses high-energy microwaves to heat up the endometrium and remove it. ? Thermal balloon. This method involves inserting a catheter with a balloon tip into the uterus. The balloon tip is filled with   heated fluid to remove the endometrium. The procedure may vary among health care providers and hospitals. What happens after the procedure?  Your blood pressure, heart rate, breathing rate, and blood oxygen level will be monitored until the medicines you were given have worn off.  As tissue healing occurs, you may notice  vaginal bleeding for 4-6 weeks after the procedure. You may also experience: ? Cramps. ? Thin, watery vaginal discharge that is light pink or brown in color. ? A need to urinate more frequently than usual. ? Nausea.  Do not drive for 24 hours if you were given a sedative.  Do not have sex or insert anything into your vagina until your health care provider approves. Summary  Endometrial ablation is done to treat the many causes of heavy menstrual bleeding.  The procedure may be done only after medications have been tried to control the bleeding.  Plan to have someone take you home from the hospital or clinic. This information is not intended to replace advice given to you by your health care provider. Make sure you discuss any questions you have with your health care provider. Document Revised: 04/17/2018 Document Reviewed: 11/17/2016 Elsevier Patient Education  [place]. Laparoscopic Tubal Ligation, Care After This sheet gives you information about how to care for yourself after your procedure. Your health care provider may also give you more specific instructions. If you have problems or questions, contact your health care provider. What can I expect after the procedure? After the procedure, it is common to have:  A sore throat.  Discomfort in your shoulder.  Mild discomfort or cramping in your abdomen.  Gas pains.  Pain or soreness in the area where the surgical incision was made.  A bloated feeling.  Tiredness.  Nausea.  Vomiting. Follow these instructions at home: Medicines  Take over-the-counter and prescription medicines only as told by your health care provider.  Do not take aspirin because it can cause bleeding.  Ask your health care provider if the medicine prescribed to you: ? Requires you to avoid driving or using heavy machinery. ? Can cause constipation. You may need to take actions to prevent or treat constipation, such as:  Drink enough  fluid to keep your urine pale yellow.  Take over-the-counter or prescription medicines.  Eat foods that are high in fiber, such as beans, whole grains, and fresh fruits and vegetables.  Limit foods that are high in fat and processed sugars, such as fried or sweet foods. Incision care      Follow instructions from your health care provider about how to take care of your incision. Make sure you: ? Wash your hands with soap and water before and after you change your bandage (dressing). If soap and water are not available, use hand sanitizer. ? Change your dressing as told by your health care provider. ? Leave stitches (sutures), skin glue, or adhesive strips in place. These skin closures may need to stay in place for 2 weeks or longer. If adhesive strip edges start to loosen and curl up, you may trim the loose edges. Do not remove adhesive strips completely unless your health care provider tells you to do that.  Check your incision area every day for signs of infection. Check for: ? Redness, swelling, or pain. ? Fluid or blood. ? Warmth. ? Pus or a bad smell. Activity  Rest as told by your health care provider.  Avoid sitting for a long time without moving. Get up to take  short walks every 1-2 hours. This is important to improve blood flow and breathing. Ask for help if you feel weak or unsteady.  Return to your normal activities as told by your health care provider. Ask your health care provider what activities are safe for you. General instructions  Do not take baths, swim, or use a hot tub until your health care provider approves. Ask your health care provider if you may take showers. You may only be allowed to take sponge baths.  Have someone help you with your daily household tasks for the first few days.  Keep all follow-up visits as told by your health care provider. This is important. Contact a health care provider if:  You have redness, swelling, or pain around your  incision.  Your incision feels warm to the touch.  You have pus or a bad smell coming from your incision.  The edges of your incision break open after the sutures have been removed.  Your pain does not improve after 2-3 days.  You have a rash.  You repeatedly become dizzy or light-headed.  Your pain medicine is not helping. Get help right away if you:  Have a fever.  Faint.  Have increasing pain in your abdomen.  Have severe pain in one or both of your shoulders.  Have fluid or blood coming from your sutures or from your vagina.  Have shortness of breath or difficulty breathing.  Have chest pain or leg pain.  Have ongoing nausea, vomiting, or diarrhea. Summary  After the procedure, it is common to have mild discomfort or cramping in your abdomen.  Take over-the-counter and prescription medicines only as told by your health care provider.  Watch for symptoms that should prompt you to call your health care provider.  Keep all follow-up visits as told by your health care provider. This is important. This information is not intended to replace advice given to you by your health care provider. Make sure you discuss any questions you have with your health care provider. Document Revised: 04/09/2019 Document Reviewed: 09/25/2018 Elsevier Patient Education  2020 [employer].

## 2020-02-12 NOTE — Anesthesia Preprocedure Evaluation (Addendum)
Anesthesia Evaluation  Patient identified by MRN, date of birth, ID band Patient awake    Reviewed: Allergy & Precautions, NPO status , Patient's Chart, lab work & pertinent test results  Airway Mallampati: II  TM Distance: >3 FB Neck ROM: Limited   Comment: Limited extension, craniectomy/cervical spine sx for chiari malformation Dental no notable dental hx. (+) Teeth Intact, Dental Advisory Given   Pulmonary neg pulmonary ROS,    Pulmonary exam normal breath sounds clear to auscultation       Cardiovascular Exercise Tolerance: Good  Rhythm:Regular Rate:Normal     Neuro/Psych SUBOCCIPITAL CRANIECTOMY CERVICAL LAMINECTOMY  Neuromuscular disease negative psych ROS   GI/Hepatic Neg liver ROS, GERD  Medicated and Controlled,  Endo/Other  negative endocrine ROS  Renal/GU negative Renal ROS  negative genitourinary   Musculoskeletal negative musculoskeletal ROS (+)   Abdominal   Peds  Hematology negative hematology ROS (+)   Anesthesia Other Findings   Reproductive/Obstetrics negative OB ROS                           Anesthesia Physical Anesthesia Plan  ASA: II  Anesthesia Plan: General   Post-op Pain Management:    Induction: Intravenous  PONV Risk Score and Plan: 4 or greater and Ondansetron, Dexamethasone and Midazolam  Airway Management Planned: Oral ETT  Additional Equipment:   Intra-op Plan:   Post-operative Plan: Extubation in OR  Informed Consent: I have reviewed the patients History and Physical, chart, labs and discussed the procedure including the risks, benefits and alternatives for the proposed anesthesia with the patient or authorized representative who has indicated his/her understanding and acceptance.     Dental advisory given  Plan Discussed with: CRNA  Anesthesia Plan Comments:        Anesthesia Quick Evaluation

## 2020-02-12 NOTE — H&P (Signed)
Preoperative History and Physical  Mary Moses is a 34 y.o. G1P1001 with Patient's last menstrual period was 02/10/2020. admitted for a hysteroscopy uterine curettage Minerva ablation and laparoscopic bilateral sapingectomy.   Pt with long history of menorrhagia unresponsive to nexplanon patch depo provera Was scheduled 07/2018 for surgery but backed out Soils sheets wears double pads soils clothes  Uses 36 pads per cycle Heavy pain as well no dyspareunia  No pain other than with cycle  Desires salpingectomy for ovarian cancer prophylaxis  PMH:    Past Medical History:  Diagnosis Date  . Abdominal bloating 09/16/2015  . Abnormal Pap smear   . Chiari I malformation (HCC)   . Cramps, extremity 09/16/2015  . Drug induced amenorrhea 09/16/2015   Has nexplanon  . GERD (gastroesophageal reflux disease)   . Hypokalemia   . Nexplanon in place 09/16/2015  . Nexplanon insertion 07/09/2013   nexplanon inserted 8/26 left arm remove 07/09/16  . Obesity   . Pregnancy   . Swelling 06/19/2013  . Vaginal Pap smear, abnormal     PSH:     Past Surgical History:  Procedure Laterality Date  . CESAREAN SECTION N/A 06/08/2013   Procedure: Primary CESAREAN SECTION of baby girl  at 2056 APGAR 8/9;  Surgeon: Tereso Newcomer, MD;  Location: [facility];  Service: Obstetrics;  Laterality: N/A;  . SUBOCCIPITAL CRANIECTOMY CERVICAL LAMINECTOMY  01/24/2019   Procedure: Suboccipital craniectomy/cervical laminectomy/duraplasty;  Surgeon: Tressie Stalker, MD;  Location: [facility] OR;  Service: Neurosurgery;;    POb/GynH:      OB History    Gravida  1   Para  1   Term  1   Preterm      AB      Living  1     SAB      TAB      Ectopic      Multiple      Live Births  1           SH:   Social History   Tobacco Use  . Smoking status: Never Smoker  . Smokeless tobacco: Never Used  Substance Use Topics  . Alcohol use: No  . Drug use: No    FH:    Family History  Problem Relation Age  of Onset  . Kidney disease Mother   . Heart disease Mother   . Hypertension Mother   . Cancer Father        metastatic prostate   . Hypertension Sister      Allergies:  Allergies  Allergen Reactions  . Penicillins Nausea And Vomiting    Has patient had a PCN reaction causing immediate rash, facial/tongue/throat swelling, SOB or lightheadedness with hypotension: no Has patient had a PCN reaction causing severe rash involving mucus membranes or skin necrosis: no Has patient had a PCN reaction that required hospitalization no Has patient had a PCN reaction occurring within the last 10 years: no If all of the above answers are "NO", then may proceed with Cephalosporin   . Tramadol Hcl Nausea And Vomiting    Medications:       Current Facility-Administered Medications:  .  bupivacaine liposome (EXPAREL) 1.3 % injection 266 mg, 20 mL, Infiltration, Once, Bronte Kropf H, MD .  ceFAZolin (ANCEF) IVPB 2g/100 mL premix, 2 g, Intravenous, On Call to OR, Lazaro Arms, MD  Review of Systems:   Review of Systems  Constitutional: Negative for fever, chills, weight loss, malaise/fatigue and diaphoresis.  HENT:  Negative for hearing loss, ear pain, nosebleeds, congestion, sore throat, neck pain, tinnitus and ear discharge.   Eyes: Negative for blurred vision, double vision, photophobia, pain, discharge and redness.  Respiratory: Negative for cough, hemoptysis, sputum production, shortness of breath, wheezing and stridor.   Cardiovascular: Negative for chest pain, palpitations, orthopnea, claudication, leg swelling and PND.  Gastrointestinal: Positive for abdominal pain. Negative for heartburn, nausea, vomiting, diarrhea, constipation, blood in stool and melena.  Genitourinary: Negative for dysuria, urgency, frequency, hematuria and flank pain.  Musculoskeletal: Negative for myalgias, back pain, joint pain and falls.  Skin: Negative for itching and rash.  Neurological: Negative for dizziness,  tingling, tremors, sensory change, speech change, focal weakness, seizures, loss of consciousness, weakness and headaches.  Endo/Heme/Allergies: Negative for environmental allergies and polydipsia. Does not bruise/bleed easily.  Psychiatric/Behavioral: Negative for depression, suicidal ideas, hallucinations, memory loss and substance abuse. The patient is not nervous/anxious and does not have insomnia.      PHYSICAL EXAM:  Blood pressure 128/80, pulse 83, temperature 98.1 F (36.7 C), temperature source Oral, height 5\' 6"  (1.676 m), weight 89.8 kg, last menstrual period 02/10/2020, SpO2 97 %.    Vitals reviewed. Constitutional: She is oriented to person, place, and time. She appears well-developed and well-nourished.  HENT:  Head: Normocephalic and atraumatic.  Right Ear: External ear normal.  Left Ear: External ear normal.  Nose: Nose normal.  Mouth/Throat: Oropharynx is clear and moist.  Eyes: Conjunctivae and EOM are normal. Pupils are equal, round, and reactive to light. Right eye exhibits no discharge. Left eye exhibits no discharge. No scleral icterus.  Neck: Normal range of motion. Neck supple. No tracheal deviation present. No thyromegaly present.  Cardiovascular: Normal rate, regular rhythm, normal heart sounds and intact distal pulses.  Exam reveals no gallop and no friction rub.   No murmur heard. Respiratory: Effort normal and breath sounds normal. No respiratory distress. She has no wheezes. She has no rales. She exhibits no tenderness.  GI: Soft. Bowel sounds are normal. She exhibits no distension and no mass. There is tenderness. There is no rebound and no guarding.  Genitourinary:       Vulva is normal without lesions Vagina is pink moist without discharge Cervix normal in appearance and pap is normal Uterus is normal size, contour, position, consistency, mobility, non-tender Adnexa is negative with normal sized ovaries by sonogram  Musculoskeletal: Normal range of  motion. She exhibits no edema and no tenderness.  Neurological: She is alert and oriented to person, place, and time. She has normal reflexes. She displays normal reflexes. No cranial nerve deficit. She exhibits normal muscle tone. Coordination normal.  Skin: Skin is warm and dry. No rash noted. No erythema. No pallor.  Psychiatric: She has a normal mood and affect. Her behavior is normal. Judgment and thought content normal.    Labs: Results for orders placed or performed during the hospital encounter of 02/10/20 (from the past 336 hour(s))  CBC   Collection Time: 02/10/20  3:05 PM  Result Value Ref Range   WBC 7.7 4.0 - 10.5 K/uL   RBC 4.42 3.87 - 5.11 MIL/uL   Hemoglobin 12.2 12.0 - 15.0 g/dL   HCT 02/12/20 92.3 - 30.0 %   MCV 89.4 80.0 - 100.0 fL   MCH 27.6 26.0 - 34.0 pg   MCHC 30.9 30.0 - 36.0 g/dL   RDW 76.2 26.3 - 33.5 %   Platelets 238 150 - 400 K/uL   nRBC 0.0 0.0 - 0.2 %  Comprehensive metabolic panel   Collection Time: 02/10/20  3:05 PM  Result Value Ref Range   Sodium 135 135 - 145 mmol/L   Potassium 3.9 3.5 - 5.1 mmol/L   Chloride 102 98 - 111 mmol/L   CO2 25 22 - 32 mmol/L   Glucose, Bld 82 70 - 99 mg/dL   BUN 10 6 - 20 mg/dL   Creatinine, Ser 0.89 0.44 - 1.00 mg/dL   Calcium 8.5 (L) 8.9 - 10.3 mg/dL   Total Protein 7.0 6.5 - 8.1 g/dL   Albumin 3.4 (L) 3.5 - 5.0 g/dL   AST 16 15 - 41 U/L   ALT 15 0 - 44 U/L   Alkaline Phosphatase 44 38 - 126 U/L   Total Bilirubin 0.2 (L) 0.3 - 1.2 mg/dL   GFR calc non Af Amer >60 >60 mL/min   GFR calc Af Amer >60 >60 mL/min   Anion gap 8 5 - 15  hCG, quantitative, pregnancy   Collection Time: 02/10/20  3:06 PM  Result Value Ref Range   hCG, Beta Chain, Quant, S <1 <5 mIU/mL  Rapid HIV screen (HIV 1/2 Ab+Ag)   Collection Time: 02/10/20  3:06 PM  Result Value Ref Range   HIV-1 P24 Antigen - HIV24 NON REACTIVE NON REACTIVE   HIV 1/2 Antibodies NON REACTIVE NON REACTIVE   Interpretation (HIV Ag Ab)      A non reactive test  result means that HIV 1 or HIV 2 antibodies and HIV 1 p24 antigen were not detected in the specimen.  Results for orders placed or performed during the hospital encounter of 02/10/20 (from the past 336 hour(s))  SARS CORONAVIRUS 2 (TAT 6-24 HRS) Nasopharyngeal Nasopharyngeal Swab   Collection Time: 02/10/20  7:23 AM   Specimen: Nasopharyngeal Swab  Result Value Ref Range   SARS Coronavirus 2 NEGATIVE NEGATIVE    EKG: Orders placed or performed during the hospital encounter of 01/07/19  . ED EKG  . ED EKG  . EKG    Imaging Studies: US PELVIS TRANSVAGINAL NON-OB (TV ONLY)  Result Date: 01/17/2020 GYNECOLOGIC SONOGRAM BASYA CASAVANT is a 35 y.o. G1P1001 Patient's last menstrual period was 01/04/2020. She is here for a pelvic sonogram for menorrhagia,dysmenorrhea. Uterus                      8.7 x 3.3 x 4.2 cm, Total uterine volume 64 cc,anteverted/retroflexed uterus,wnl Endometrium          8.8 mm, symmetrical, wnl Right ovary             2.5 x 1.6 x 2.1 cm, wnl Left ovary                2.6 x 2.8 x 2.7 cm, wnl,unable to slide left ovary No free fluid Technician Comments: PELVIC US TA/TV:anteverted/retroflexed uterus,wnl,normal ovaries,EEC 8.8 mm,unable to slide left ovary,no free fluid,no pain during ultrasound Chaperone [address] Heide Guile 01/17/2020 9:53 AM Clinical Impression and recommendations: I have reviewed the sonogram results above, combined with the patient's current clinical course, below are my impressions and any appropriate recommendations for management based on the sonographic findings. Uterus is small normal shape no pathology Endometrium normal Both ovaries are normal Florian Buff 01/17/2020 10:39 AM  US PELVIS (TRANSABDOMINAL ONLY)  Result Date: 01/17/2020 GYNECOLOGIC SONOGRAM DENEA CHEANEY is a 34 y.o. G1P1001 Patient's last menstrual period was 01/04/2020. She is here for a pelvic sonogram for menorrhagia,dysmenorrhea. Uterus  8.7 x 3.3 x 4.2 cm,  Total uterine volume 64 cc,anteverted/retroflexed uterus,wnl Endometrium          8.8 mm, symmetrical, wnl Right ovary             2.5 x 1.6 x 2.1 cm, wnl Left ovary                2.6 x 2.8 x 2.7 cm, wnl,unable to slide left ovary No free fluid Technician Comments: PELVIC US TA/TV:anteverted/retroflexed uterus,wnl,normal ovaries,EEC 8.8 mm,unable to slide left ovary,no free fluid,no pain during ultrasound Chaperone [address] Flora Lipps 01/17/2020 9:53 AM Clinical Impression and recommendations: I have reviewed the sonogram results above, combined with the patient's current clinical course, below are my impressions and any appropriate recommendations for management based on the sonographic findings. Uterus is small normal shape no pathology Endometrium normal Both ovaries are normal Lazaro Arms 01/17/2020 10:39 AM     Assessment: Menorrhagia with regular cycle Dysmenorrhea Desires permanent sterilization, opts for salpingectomy for ovarian cancer prophylaxis  Plan: Hysteroscopy uterine curettage Minerva endometrial ablation and Laparoscopic bilateral salpingectomy for sterilization  Lazaro Arms 02/12/2020 7:15 AM

## 2020-02-12 NOTE — Anesthesia Postprocedure Evaluation (Signed)
Anesthesia Post Note  Patient: Mary Moses  Procedure(s) Performed: DILATATION AND CURETTAGE/HYSTEROSCOPY WITH MINERVA  ABLATION (N/A ) LAPAROSCOPIC BILATERAL SALPINGECTOMY (Bilateral )  Patient location during evaluation: PACU Anesthesia Type: General Level of consciousness: awake and alert, oriented and patient cooperative Pain management: pain level controlled Vital Signs Assessment: post-procedure vital signs reviewed and stable Respiratory status: spontaneous breathing and respiratory function stable Cardiovascular status: blood pressure returned to baseline and stable Postop Assessment: no headache, no backache and no apparent nausea or vomiting Anesthetic complications: no     Last Vitals:  Vitals:   02/12/20 0911 02/12/20 0915  BP: 124/66   Pulse:  93  Resp: 14 16  Temp: (!) 36.4 C   SpO2: (P) 100% 100%    Last Pain:  Vitals:   02/12/20 0659  TempSrc: Oral  PainSc: 0-No pain                 Jarica Plass

## 2020-02-13 LAB — SURGICAL PATHOLOGY

## 2020-02-24 ENCOUNTER — Encounter: Payer: Self-pay | Admitting: Obstetrics & Gynecology

## 2020-02-24 ENCOUNTER — Ambulatory Visit (INDEPENDENT_AMBULATORY_CARE_PROVIDER_SITE_OTHER): Payer: 59 | Admitting: Obstetrics & Gynecology

## 2020-02-24 ENCOUNTER — Other Ambulatory Visit: Payer: Self-pay

## 2020-02-24 VITALS — BP 114/69 | HR 91 | Ht 66.0 in | Wt 197.0 lb

## 2020-02-24 DIAGNOSIS — Z9889 Other specified postprocedural states: Secondary | ICD-10-CM

## 2020-02-24 NOTE — Progress Notes (Signed)
  HPI: Patient returns for routine postoperative follow-up having undergone endoomettrial ablation/salpingectomy for sterilization on 02/12/20.  The patient's immediate postoperative recovery has been unremarkable. Since hospital discharge the patient reports no problems.   Current Outpatient Medications: .  Biotin w/ Vitamins C & E (HAIR/SKIN/NAILS PO), Take 1 tablet by mouth daily., Disp: , Rfl:  .  Cholecalciferol (VITAMIN D3 PO), Take 5 sprays by mouth daily., Disp: , Rfl:  .  HYDROcodone-acetaminophen (NORCO/VICODIN) 5-325 MG tablet, Take 1 tablet by mouth every 6 (six) hours as needed., Disp: 15 tablet, Rfl: 0 .  ketorolac (TORADOL) 10 MG tablet, Take 1 tablet (10 mg total) by mouth every 8 (eight) hours as needed., Disp: 15 tablet, Rfl: 0 .  LINZESS 145 MCG CAPS capsule, TAKE 1 CAPSULE BY MOUTH 30 MINUTES PRIOR TO YOUR FIRST MEAL. (Patient taking differently: Take 145 mcg by mouth daily as needed (constipation). ), Disp: 30 capsule, Rfl: 11 .  phentermine (ADIPEX-P) 37.5 MG tablet, Take 37.5 mg by mouth daily before breakfast., Disp: , Rfl:  .  norelgestromin-ethinyl estradiol (XULANE) 150-35 MCG/24HR transdermal patch, APPLY 1 PATCH TOPICALLY ONCE A WEEK (Patient not taking: Reported on 02/24/2020), Disp: 3 patch, Rfl: 6 .  ondansetron (ZOFRAN ODT) 8 MG disintegrating tablet, Take 1 tablet (8 mg total) by mouth every 8 (eight) hours as needed for nausea or vomiting. (Patient not taking: Reported on 02/24/2020), Disp: 8 tablet, Rfl: 0 .  pantoprazole (PROTONIX) 20 MG tablet, Take 1 tablet by mouth twice daily (Patient not taking: No sig reported), Disp: 60 tablet, Rfl: 1 .  vitamin B-12 (CYANOCOBALAMIN) 500 MCG tablet, Take 500 mcg by mouth daily., Disp: , Rfl:   No current facility-administered medications for this visit.    Blood pressure 114/69, pulse 91, height 5\' 6"  (1.676 m), weight 197 lb (89.4 kg), last menstrual period 02/10/2020.  Physical Exam: Incisions x 3 healing  well  Diagnostic Tests:   Pathology: benign  Impression: S/p lap salpingectomy and endometrial ablation  Plan:   Follow up: prn    02/12/2020, MD

## 2020-03-04 ENCOUNTER — Ambulatory Visit (INDEPENDENT_AMBULATORY_CARE_PROVIDER_SITE_OTHER): Payer: 59 | Admitting: Obstetrics and Gynecology

## 2020-03-04 ENCOUNTER — Encounter: Payer: Self-pay | Admitting: Obstetrics and Gynecology

## 2020-03-04 ENCOUNTER — Other Ambulatory Visit: Payer: Self-pay

## 2020-03-04 DIAGNOSIS — Z09 Encounter for follow-up examination after completed treatment for conditions other than malignant neoplasm: Secondary | ICD-10-CM

## 2020-03-04 NOTE — Progress Notes (Signed)
Patient ID: Mary Moses, female   DOB: August 31, 1986, 34 y.o.   MRN: [id number]  Subjective:  Mary Moses is a 34 y.o. female now 3 weeks status post hysteroscopy with minerva ablation and laparoscopic bilateral salpingectomy.                                                                                                                                     Patient reports that her skin separated slightly open at the umbilicus.The left side is raised slightly and is growing over nicely by secondary intention.  Review of Systems Negative except excess pressure with flank pain on urination.    Diet:   normal   Bowel movements : normal.  Pain is controlled without any medications.  Objective:  LMP 02/10/2020  General:Well developed, well nourished.  No acute distress. Granulation tissue Abdomen: Bowel sounds normal, soft, non-tender. Pelvic Exam:    External Genitalia:  Light pink discharge without any odor.     Vagina: Normal    Cervix: Tenderness with palpitation. Patient rates 6/10.     Uterus: Normal    Adnexa/Bimanual: Normal  Incision(s): Healing moderately well with no drainage, no erythema, no hernia, no swelling, no dehiscence. Small area of skin healing abnormally on RLQ of abdomen.      Assessment:  Post-Op 3 weeks s/p hysteroscopy with minerva ablation and laparoscopic bilateral salpingectomy   Silver nitrate applied postoperatively to RLQ abdominal incision. Granulation tissue trimmed and treated with silver nitrate.    Plan:  1.Wound care discussed: may treat with antibiotic ointment. Topical neosporin 2. Current medications. none 3. Activity restrictions: none 4. return to work: not applicable. 5. Follow up in 1 week.   By signing my name below, I, [employer], attest that this documentation has been prepared under the direction and in the presence of Tilda Burrow, MD. Electronically Signed: Mal Misty Medical Scribe. 03/04/20. 11:54 AM.  I  personally performed the services described in this documentation, which was SCRIBED in my presence. The recorded information has been reviewed and considered accurate. It has been edited as necessary during review. Tilda Burrow, MD

## 2020-03-09 ENCOUNTER — Other Ambulatory Visit: Payer: Self-pay | Admitting: Adult Health

## 2020-03-13 ENCOUNTER — Encounter: Payer: 59 | Admitting: Obstetrics & Gynecology

## 2020-03-27 ENCOUNTER — Other Ambulatory Visit: Payer: Self-pay

## 2020-03-27 ENCOUNTER — Ambulatory Visit: Payer: 59 | Admitting: Obstetrics and Gynecology

## 2020-03-27 ENCOUNTER — Encounter: Payer: Self-pay | Admitting: Obstetrics and Gynecology

## 2020-03-27 VITALS — BP 126/67 | HR 115 | Ht 66.0 in | Wt 195.2 lb

## 2020-03-27 DIAGNOSIS — L03311 Cellulitis of abdominal wall: Secondary | ICD-10-CM

## 2020-03-27 DIAGNOSIS — Z09 Encounter for follow-up examination after completed treatment for conditions other than malignant neoplasm: Secondary | ICD-10-CM

## 2020-03-27 MED ORDER — AMOXICILLIN-POT CLAVULANATE 875-125 MG PO TABS: 1.0000 | ORAL_TABLET | Freq: Two times a day (BID) | ORAL | 0 refills | Status: DC

## 2020-03-27 MED ORDER — ACETAMINOPHEN-CODEINE #3 300-30 MG PO TABS: 2.0000 | ORAL_TABLET | ORAL | 0 refills | Status: DC | PRN

## 2020-03-27 NOTE — Addendum Note (Signed)
Addended by: Tilda Burrow on: 03/27/2020 10:03 PM   Modules accepted: Orders

## 2020-03-27 NOTE — Progress Notes (Addendum)
   Patient ID: Mary Moses, female   DOB: [birth year], 34 y.o.   MRN: [id number]     Subjective:  Mary Moses is a 34 y.o. female now 6 weeks status post hysteroscopy with minerva ablation and laparaoscopic bilateral salpingectomy.  She reports pain and drainage from incision site. Has been applying Neosporin.  Review of Systems Negative except for pain and drainage from incision site.   Diet: Normal   Bowel movements : normal.  Pain is not well controlled.  Medications being used: none. She has Codeine but does not like the way that it makes her feel.  Objective:  BP 126/67 (BP Location: Left Arm, Patient Position: Sitting, Cuff Size: Normal)   Pulse (!) 115   Ht 5\' 6"  (1.676 m)   Wt 195 lb 3.2 oz (88.5 kg)   BMI 31.51 kg/m  General:Well developed, well nourished.  No acute distress. Abdomen: Bowel sounds normal, soft Incision: Mild Peau d'orange with incisional edema. Rule out infection.     Assessment:  Post-Op 6 weeks s/p hysteroscopy with minerva ablation and laparoscopic bilateral salpingectomy   Suspect wound infection.cellulitis , less than 2 cm   Plan:  1. Wound care discussed. Will prescribe Augmentin. Continue to use Neosporin. 2. Current medications. Will prescribe Tylenol with Codeine. rx not picked up by pt in office, sent to pharmacy electronically at 10 pm. 3. Activity restrictions: none 4. Return to work: not applicable. 5. Follow up in 1 week. 6 local heat x 2O mnutes q2-4 hr.By signing my name below, I, , attest that this documentation has been prepared under the direction and in the presence of Nikki Dom, MD. Electronically Signed: Maleeha Tilda Burrow. 03/27/20. 12:35 PM.  I personally performed the services described in this documentation, which was SCRIBED in my presence. The recorded information has been reviewed and considered accurate. It has been edited as necessary during review. 03/29/20, MD

## 2020-04-03 ENCOUNTER — Ambulatory Visit (INDEPENDENT_AMBULATORY_CARE_PROVIDER_SITE_OTHER): Payer: 59 | Admitting: Obstetrics & Gynecology

## 2020-04-03 ENCOUNTER — Encounter: Payer: Self-pay | Admitting: Obstetrics & Gynecology

## 2020-04-03 VITALS — BP 120/62 | HR 86 | Ht 66.0 in | Wt 193.5 lb

## 2020-04-03 DIAGNOSIS — L03316 Cellulitis of umbilicus: Secondary | ICD-10-CM

## 2020-04-03 DIAGNOSIS — Z48816 Encounter for surgical aftercare following surgery on the genitourinary system: Secondary | ICD-10-CM

## 2020-04-03 NOTE — Progress Notes (Signed)
  HPI: Patient returns for routine postoperative follow-up having undergone laparoscopic bilateral salpingectomy and Minerva ablation on 02/12/20.  The patient's immediate postoperative recovery has been unremarkable. Since hospital discharge the patient reports resolved symptoms.   Current Outpatient Medications: .  acetaminophen-codeine (TYLENOL #3) 300-30 MG tablet, Take 2 tablets by mouth every 4 (four) hours as needed for moderate pain., Disp: 30 tablet, Rfl: 0 .  amoxicillin-clavulanate (AUGMENTIN) 875-125 MG tablet, Take 1 tablet by mouth 2 (two) times daily., Disp: 14 tablet, Rfl: 0 .  Biotin w/ Vitamins C & E (HAIR/SKIN/NAILS PO), Take 1 tablet by mouth daily., Disp: , Rfl:  .  Cholecalciferol (VITAMIN D3 PO), Take 5 sprays by mouth daily., Disp: , Rfl:  .  ondansetron (ZOFRAN ODT) 8 MG disintegrating tablet, Take 1 tablet (8 mg total) by mouth every 8 (eight) hours as needed for nausea or vomiting., Disp: 8 tablet, Rfl: 0 .  pantoprazole (PROTONIX) 20 MG tablet, Take 1 tablet by mouth twice daily, Disp: 60 tablet, Rfl: 0 .  phentermine (ADIPEX-P) 37.5 MG tablet, Take 37.5 mg by mouth daily before breakfast., Disp: , Rfl:  .  vitamin B-12 (CYANOCOBALAMIN) 500 MCG tablet, Take 500 mcg by mouth daily., Disp: , Rfl:   No current facility-administered medications for this visit.    Blood pressure 120/62, pulse 86, height 5\' 6"  (1.676 m), weight 193 lb 8 oz (87.8 kg).  Physical Exam: Umbilical incision with ho evidence of infection non tender on exam Delayed 5 weeks post op most likely a staph infection not primary from the surgery but secondary  Diagnostic Tests:   Pathology:   Impression: Post op cellulitis of umbilicus, delayed  Plan: Continue neosporin for 2 more weeks or so  Follow up: prn    , MD

## 2020-05-18 ENCOUNTER — Other Ambulatory Visit: Payer: Self-pay | Admitting: Adult Health

## 2020-07-14 ENCOUNTER — Other Ambulatory Visit: Payer: Self-pay | Admitting: Adult Health

## 2020-08-23 ENCOUNTER — Encounter (HOSPITAL_COMMUNITY): Payer: Self-pay | Admitting: Emergency Medicine

## 2020-08-23 ENCOUNTER — Other Ambulatory Visit: Payer: Self-pay

## 2020-08-23 DIAGNOSIS — H60392 Other infective otitis externa, left ear: Secondary | ICD-10-CM | POA: Insufficient documentation

## 2020-08-23 DIAGNOSIS — H9202 Otalgia, left ear: Secondary | ICD-10-CM | POA: Diagnosis not present

## 2020-08-23 DIAGNOSIS — H60502 Unspecified acute noninfective otitis externa, left ear: Secondary | ICD-10-CM | POA: Diagnosis not present

## 2020-08-23 NOTE — ED Triage Notes (Signed)
Pt c/o left ear pain and swelling.

## 2020-08-24 ENCOUNTER — Emergency Department (HOSPITAL_COMMUNITY)
Admission: EM | Admit: 2020-08-24 | Discharge: 2020-08-24 | Disposition: A | Discharge status: Home / Self Care | Payer: BC Managed Care – PPO | Attending: Emergency Medicine | Admitting: Emergency Medicine

## 2020-08-24 DIAGNOSIS — H60392 Other infective otitis externa, left ear: Secondary | ICD-10-CM

## 2020-08-24 MED ORDER — CEPHALEXIN 500 MG PO CAPS
500.0000 mg | ORAL_CAPSULE | Freq: Three times a day (TID) | ORAL | 0 refills | Status: DC
Start: 2020-08-24 — End: 2021-01-13

## 2020-08-24 MED ORDER — CEPHALEXIN 500 MG PO CAPS
500.0000 mg | ORAL_CAPSULE | Freq: Once | ORAL | Status: AC
  Administered 2020-08-24: 500 mg via ORAL
  Filled 2020-08-24: qty 1

## 2020-08-24 MED ORDER — NEOMYCIN-POLYMYXIN-HC 3.5-10000-1 OT SUSP
4.0000 [drp] | Freq: Three times a day (TID) | OTIC | 0 refills | Status: DC
Start: 2020-08-24 — End: 2021-01-13

## 2020-08-24 NOTE — Discharge Instructions (Signed)
Use warm compresses.  Take ibuprofen and/or acetaminophen as needed for pain.  Return if symptoms are getting worse.

## 2020-08-24 NOTE — ED Provider Notes (Signed)
[facility] EMERGENCY DEPARTMENT Provider Note   CSN: [id number] Arrival date & time: 08/23/20  2055   History Chief Complaint  Patient presents with  . Otalgia    Mary Moses is a 34 y.o. female.  The history is provided by the patient.  Otalgia She has history of Chiari I malformation and comes in because of pain and swelling of her left ear.  Symptoms started 2 days ago with some swelling of her left ear and it started getting painful over the last 12-24 hours.  She rates pain at 10/10.  She has not noticed any change in her hearing.  There has been no drainage from the ear.  She denies fever or chills.  She denies any trauma.  She has taken ibuprofen for pain without any relief.  Past Medical History:  Diagnosis Date  . Abdominal bloating 09/16/2015  . Abnormal Pap smear   . Chiari I malformation (HCC)   . Cramps, extremity 09/16/2015  . Drug induced amenorrhea 09/16/2015   Has nexplanon  . GERD (gastroesophageal reflux disease)   . Hypokalemia   . Nexplanon in place 09/16/2015  . Nexplanon insertion 07/09/2013   nexplanon inserted 8/26 left arm remove 07/09/16  . Obesity   . Pregnancy   . Swelling 06/19/2013  . Vaginal Pap smear, abnormal     Patient Active Problem List   Diagnosis Date Noted  . Cellulitis of abdominal wall 03/27/2020  . Postop check 03/27/2020  . Papanicolaou smear of cervix with positive high risk human papilloma virus (HPV) test 11/29/2019  . Encounter for surveillance of transdermal patch hormonal contraceptive device 11/25/2019  . Dysmenorrhea 11/25/2019  . Menorrhagia with irregular cycle 11/25/2019  . Encounter for gynecological examination with Papanicolaou smear of cervix 11/25/2019  . Chiari I malformation (HCC) 01/24/2019  . Numbness and tingling 11/09/2018  . Gastroesophageal reflux disease 05/10/2017  . History of hypokalemia 05/10/2017  . Cramps, extremity 09/16/2015  . Abdominal bloating 09/16/2015  . Nexplanon in place  09/16/2015  . Drug induced amenorrhea 09/16/2015  . 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 Nexplanon insertion 07/09/2013  . Abnormal Pap smear of cervix 04/01/2013    Past Surgical History:  Procedure Laterality Date  . CESAREAN SECTION N/A 06/08/2013   Procedure: Primary CESAREAN SECTION of baby girl  at 2056 APGAR 8/9;  Surgeon: Tereso Newcomer, MD;  Location: [facility];  Service: Obstetrics;  Laterality: N/A;  . DILATATION AND CURETTAGE/HYSTEROSCOPY WITH MINERVA N/A 02/12/2020   Procedure: DILATATION AND CURETTAGE/HYSTEROSCOPY WITH MINERVA  ABLATION;  Surgeon: Lazaro Arms, MD;  Location: [facility];  Service: Gynecology;  Laterality: N/A;  . LAPAROSCOPIC BILATERAL SALPINGECTOMY Bilateral 02/12/2020   Procedure: LAPAROSCOPIC BILATERAL SALPINGECTOMY;  Surgeon: Lazaro Arms, MD;  Location: [facility];  Service: Gynecology;  Laterality: Bilateral;  . SUBOCCIPITAL CRANIECTOMY CERVICAL LAMINECTOMY  01/24/2019   Procedure: Suboccipital craniectomy/cervical laminectomy/duraplasty;  Surgeon: Tressie Stalker, MD;  Location: [facility] OR;  Service: Neurosurgery;;     OB History    Gravida  1   Para  1   Term  1   Preterm      AB      Living  1     SAB      TAB      Ectopic      Multiple      Live Births  1           Family History  Problem Relation Age of Onset  . Kidney disease  Mother   . Heart disease Mother   . Hypertension Mother   . Cancer Father        metastatic prostate   . Hypertension Sister     Social History   Tobacco Use  . Smoking status: Never Smoker  . Smokeless tobacco: Never Used  Vaping Use  . Vaping Use: Never used  Substance Use Topics  . Alcohol use: No  . Drug use: No    Home Medications Prior to Admission medications   Medication Sig Start Date End Date Taking? Authorizing Provider  acetaminophen-codeine (TYLENOL #3) 300-30 MG tablet Take 2 tablets by mouth every 4 (four) hours as needed for moderate pain. 03/27/20   Tilda Burrow, MD    Biotin w/ Vitamins C & E (HAIR/SKIN/NAILS PO) Take 1 tablet by mouth daily.    [provider]  cephALEXin (KEFLEX) 500 MG capsule Take 1 capsule (500 mg total) by mouth 3 (three) times daily. 08/24/20   Dione Booze, MD  Cholecalciferol (VITAMIN D3 PO) Take 5 sprays by mouth daily.    [provider]  neomycin-polymyxin-hydrocortisone (CORTISPORIN) 3.5-10000-1 OTIC suspension Place 4 drops into the left ear 3 (three) times daily. X 7 days 08/24/20   Dione Booze, MD  ondansetron [facility] ODT) 8 MG disintegrating tablet Take 1 tablet (8 mg total) by mouth every 8 (eight) hours as needed for nausea or vomiting. 02/12/20   Lazaro Arms, MD  pantoprazole (PROTONIX) 20 MG tablet Take 1 tablet by mouth twice daily 07/15/20   Adline Potter, NP  phentermine (ADIPEX-P) 37.5 MG tablet Take 37.5 mg by mouth daily before breakfast.    [provider]  vitamin B-12 (CYANOCOBALAMIN) 500 MCG tablet Take 500 mcg by mouth daily.    [provider]    Allergies    Penicillins and Tramadol hcl  Review of Systems   Review of Systems  HENT: Positive for ear pain.   All other systems reviewed and are negative.   Physical Exam Updated Vital Signs BP 135/83 (BP Location: Right Arm)   Pulse 90   Temp 98.8 F (37.1 C) (Oral)   Resp 18   Ht 5\' 6"  (1.676 m)   Wt 92.5 kg   SpO2 100%   BMI 32.93 kg/m   Physical Exam Vitals and nursing note reviewed.   34 year old female, resting comfortably and in no acute distress. Vital signs are normal. Oxygen saturation is 100%, which is normal. Head is normocephalic and atraumatic. PERRLA, EOMI. Oropharynx is clear.  There is no obvious swelling of the left ear and no palpable preauricular or postauricular lymph nodes.  There is pain elicited when tension is applied to the helix of the left ear.  Tympanic membrane is clear.  Right tympanic membrane is obscured by cerumen. Neck is nontender and supple without adenopathy or  JVD. Back is nontender and there is no CVA tenderness. Lungs are clear without rales, wheezes, or rhonchi. Chest is nontender. Heart has regular rate and rhythm without murmur. Abdomen is soft, flat, nontender without masses or hepatosplenomegaly and peristalsis is normoactive. Extremities have no cyanosis or edema, full range of motion is present. Skin is warm and dry without rash. Neurologic: Mental status is normal, cranial nerves are intact, there are no motor or sensory deficits.  ED Results / Procedures / Treatments    Procedures Procedures  Medications Ordered in ED Medications  cephALEXin (KEFLEX) capsule 500 mg (has no administration in time range)  ED Course  I have reviewed the triage vital signs and the nursing notes.  MDM Rules/Calculators/A&P Left otitis externa.  Old records are reviewed, and she has no relevant past visits.  She is discharged with prescriptions for cephalexin and Cortisporin otic and is referred to ENT for follow-up.  Final Clinical Impression(s) / ED Diagnoses Final diagnoses:  Acute infective otitis externa, left    Rx / DC Orders ED Discharge Orders         Ordered    cephALEXin (KEFLEX) 500 MG capsule  3 times daily        08/24/20 0408    neomycin-polymyxin-hydrocortisone (CORTISPORIN) 3.5-10000-1 OTIC suspension  3 times daily        08/24/20 0408           Dione Booze, MD 08/24/20 [phone]

## 2020-09-05 ENCOUNTER — Other Ambulatory Visit: Payer: Self-pay | Admitting: Adult Health

## 2020-09-21 DIAGNOSIS — Z5181 Encounter for therapeutic drug level monitoring: Secondary | ICD-10-CM | POA: Diagnosis not present

## 2020-09-21 DIAGNOSIS — R5383 Other fatigue: Secondary | ICD-10-CM | POA: Diagnosis not present

## 2020-09-21 DIAGNOSIS — Z713 Dietary counseling and surveillance: Secondary | ICD-10-CM | POA: Diagnosis not present

## 2020-09-21 DIAGNOSIS — Z6835 Body mass index (BMI) 35.0-35.9, adult: Secondary | ICD-10-CM | POA: Diagnosis not present

## 2020-09-21 DIAGNOSIS — Z7182 Exercise counseling: Secondary | ICD-10-CM | POA: Diagnosis not present

## 2020-09-21 DIAGNOSIS — E669 Obesity, unspecified: Secondary | ICD-10-CM | POA: Diagnosis not present

## 2020-11-12 DIAGNOSIS — Z20822 Contact with and (suspected) exposure to covid-19: Secondary | ICD-10-CM | POA: Diagnosis not present

## 2020-11-18 DIAGNOSIS — Z20822 Contact with and (suspected) exposure to covid-19: Secondary | ICD-10-CM | POA: Diagnosis not present

## 2020-11-20 ENCOUNTER — Other Ambulatory Visit: Payer: Self-pay | Admitting: Adult Health

## 2020-11-24 DIAGNOSIS — Z20822 Contact with and (suspected) exposure to covid-19: Secondary | ICD-10-CM | POA: Diagnosis not present

## 2020-12-03 ENCOUNTER — Telehealth: Payer: Self-pay

## 2020-12-03 MED ORDER — OMEPRAZOLE 10 MG PO CPDR: DELAYED_RELEASE_CAPSULE | ORAL | 1 refills | Status: DC

## 2020-12-03 NOTE — Telephone Encounter (Signed)
Pt stated that she needs her pantoprazole Rx refilled, she stated that the"pharmacy was waiting on a signature"

## 2020-12-03 NOTE — Telephone Encounter (Signed)
Will rx prilosec

## 2020-12-03 NOTE — Telephone Encounter (Signed)
Pt's insurance has denied Protonix. Can you prescribe something different? Thanks!! JSY

## 2020-12-04 DIAGNOSIS — Z20822 Contact with and (suspected) exposure to covid-19: Secondary | ICD-10-CM | POA: Diagnosis not present

## 2020-12-10 ENCOUNTER — Telehealth: Payer: Self-pay | Admitting: *Deleted

## 2020-12-10 NOTE — Telephone Encounter (Signed)
Pt has tried Prilosec with no help. I have tried twice to get Pantoprazole 20 mg BID approved without success. Can you prescribe it at 40 mg 1 daily and see if that makes a difference? [facility] in [place] suggested that. Thanks!! JSY

## 2020-12-11 MED ORDER — PANTOPRAZOLE SODIUM 40 MG PO TBEC: 40.0000 mg | DELAYED_RELEASE_TABLET | Freq: Every day | ORAL | 3 refills | Status: DC

## 2020-12-11 NOTE — Telephone Encounter (Signed)
Will try protonix at 40 mg

## 2020-12-14 ENCOUNTER — Encounter: Payer: Self-pay | Admitting: *Deleted

## 2020-12-14 NOTE — Telephone Encounter (Signed)
Prontonix 40 mg 1 daily is also requiring a PA. I keep running into dead ends with this. I spoke with JAG and she advised pt call her insurance company and get a list of covered acid reflux meds. Call can't be completed multiple time so I sent a MyChart message with this info. JSY

## 2021-01-13 ENCOUNTER — Ambulatory Visit (INDEPENDENT_AMBULATORY_CARE_PROVIDER_SITE_OTHER): Payer: BC Managed Care – PPO | Admitting: Adult Health

## 2021-01-13 ENCOUNTER — Other Ambulatory Visit (HOSPITAL_COMMUNITY)
Admission: RE | Admit: 2021-01-13 | Discharge: 2021-01-13 | Disposition: A | Discharge status: Home / Self Care | Payer: BC Managed Care – PPO | Source: Ambulatory Visit | Attending: Adult Health | Admitting: Adult Health

## 2021-01-13 ENCOUNTER — Other Ambulatory Visit: Payer: Self-pay

## 2021-01-13 ENCOUNTER — Encounter: Payer: Self-pay | Admitting: Adult Health

## 2021-01-13 VITALS — BP 111/65 | HR 98 | Ht 65.5 in | Wt 217.0 lb

## 2021-01-13 DIAGNOSIS — K5900 Constipation, unspecified: Secondary | ICD-10-CM | POA: Diagnosis not present

## 2021-01-13 DIAGNOSIS — F419 Anxiety disorder, unspecified: Secondary | ICD-10-CM | POA: Insufficient documentation

## 2021-01-13 DIAGNOSIS — K219 Gastro-esophageal reflux disease without esophagitis: Secondary | ICD-10-CM

## 2021-01-13 DIAGNOSIS — Z01419 Encounter for gynecological examination (general) (routine) without abnormal findings: Secondary | ICD-10-CM | POA: Diagnosis not present

## 2021-01-13 DIAGNOSIS — F329 Major depressive disorder, single episode, unspecified: Secondary | ICD-10-CM

## 2021-01-13 MED ORDER — ESOMEPRAZOLE MAGNESIUM 40 MG PO CPDR: 40.0000 mg | DELAYED_RELEASE_CAPSULE | Freq: Every day | ORAL | 3 refills | Status: DC

## 2021-01-13 MED ORDER — HYDROXYZINE HCL 10 MG PO TABS: 10.0000 mg | ORAL_TABLET | Freq: Three times a day (TID) | ORAL | 3 refills | Status: AC | PRN

## 2021-01-13 MED ORDER — ESCITALOPRAM OXALATE 10 MG PO TABS: 10.0000 mg | ORAL_TABLET | Freq: Every day | ORAL | 3 refills | Status: AC

## 2021-01-13 MED ORDER — LINACLOTIDE 145 MCG PO CAPS: 145.0000 ug | ORAL_CAPSULE | Freq: Every day | ORAL | 3 refills | Status: AC

## 2021-01-13 NOTE — Progress Notes (Signed)
Patient ID: Mary Moses, female   DOB: Dec 14, 1985, 35 y.o.   MRN: [id number] History of Present Illness: Mary Moses is a 35 year old black female, single. G1P1 in for a well woman gyn exam and pap. Having anxiety and more headaches lately, also constipated and has heartburn.  Her mom has colon cancer and is in [place] [facility].(she is 55). Mary Moses is on FMLA currerntly. Last pap 11/25/19 normal with +HPV.   Current Medications, Allergies, Past Medical History, Past Surgical History, Family History and Social History were reviewed in [employer] record.     Review of Systems:  Patient denies any headaches, hearing loss, fatigue, blurred vision, shortness of breath, chest pain, abdominal pain, problems with urination, or intercourse. No joint pain or mood swings. See HPI for positives.    Physical Exam:BP 111/65 (BP Location: Left Arm, Patient Position: Sitting, Cuff Size: Normal)   Pulse 98   Ht 5' 5.5" (1.664 m)   Wt 217 lb (98.4 kg)   BMI 35.56 kg/m  General:  Well developed, well nourished, no acute distress Skin:  Warm and dry Neck:  Midline trachea, normal thyroid, good ROM, no lymphadenopathy Lungs; Clear to auscultation bilaterally Breast:  No dominant palpable mass, retraction, or nipple discharge Cardiovascular: Regular rate and rhythm Abdomen:  Soft, non tender, no hepatosplenomegaly Pelvic:  External genitalia is normal in appearance, no lesions.  The vagina is normal in appearance. Urethra has no lesions or masses. The cervix is bulbous. Pap with GC/CHL and  HRHPV genotyping performed.  Uterus is felt to be normal size, shape, and contour.  No adnexal masses or tenderness noted.Bladder is non tender, no masses felt. Extremities/musculoskeletal:  No swelling or varicosities noted, no clubbing or cyanosis Psych:  No mood changes, alert and cooperative,seems happy AA is 2 Fall risk is low PHQ 9 score is 7 GAD 7 score is 9  Upstream - 01/13/21 1020       Pregnancy Intention Screening   Does the patient want to become pregnant in the next year? No    Does the patient's partner want to become pregnant in the next year? No    Would the patient like to discuss contraceptive options today? No      Contraception Wrap Up   Current Method Female Sterilization    End Method Female Sterilization    Contraception Counseling Provided No         Examination chaperoned by Malachy Mood LPN   Impression and Plan: 1. Encounter for gynecological examination with Papanicolaou smear of cervix Pap sent Physical in 1 year Pap in 3 if normal Mammogram at 40  2. Reactive depression Will try lexapro 10 mg daily Follow up with me in 8 weeks   3. Anxiety Will try lexapro and vistaril  4. Constipation, unspecified constipation type Will rx linzess, had used in the past   5. Gastroesophageal reflux disease without esophagitis She says Prilosec, no help used to take protonix but insurance will not cover, will try nexium  Meds ordered this encounter  Medications  . esomeprazole (NEXIUM) 40 MG capsule    Sig: Take 1 capsule (40 mg total) by mouth daily at 12 noon.    Dispense:  30 capsule    Refill:  3    Order Specific Question:   Supervising Provider    Answer:   Despina Hidden, LUTHER H [[id number]]  . linaclotide (LINZESS) 145 MCG CAPS capsule    Sig: Take 1 capsule (145 mcg total) by mouth  daily before breakfast.    Dispense:  30 capsule    Refill:  3    Order Specific Question:   Supervising Provider    Answer:   Despina Hidden, LUTHER H [[id number]]  . escitalopram (LEXAPRO) 10 MG tablet    Sig: Take 1 tablet (10 mg total) by mouth daily.    Dispense:  30 tablet    Refill:  3    Order Specific Question:   Supervising Provider    Answer:   Despina Hidden, LUTHER H [[id number]]  . hydrOXYzine (ATARAX/VISTARIL) 10 MG tablet    Sig: Take 1 tablet (10 mg total) by mouth 3 (three) times daily as needed.    Dispense:  30 tablet    Refill:  3    Order Specific Question:   Supervising  Provider    Answer:   Duane Lope H [[id number]]

## 2021-01-15 LAB — CYTOLOGY - PAP
Adequacy: ABSENT
Chlamydia: NEGATIVE
Comment: NEGATIVE
Comment: NEGATIVE
Comment: NORMAL
Diagnosis: NEGATIVE
High risk HPV: NEGATIVE
Neisseria Gonorrhea: NEGATIVE

## 2021-01-18 ENCOUNTER — Telehealth: Payer: Self-pay | Admitting: *Deleted

## 2021-01-18 NOTE — Telephone Encounter (Signed)
Pt's insurance denied covering Linzess. Victorino Dike advised can try Miralax. Pt aware and voiced understanding. JSY

## 2021-02-08 ENCOUNTER — Telehealth: Payer: Self-pay | Admitting: Adult Health

## 2021-02-08 MED ORDER — CHOLECALCIFEROL 1.25 MG (50000 UT) PO CAPS: ORAL_CAPSULE | ORAL | 4 refills | Status: DC

## 2021-02-08 NOTE — Telephone Encounter (Signed)
Will refill vitamin D 3 50,000 units once weekly

## 2021-02-08 NOTE — Telephone Encounter (Signed)
Pt is requesting a refill on Vit D3 capsule. Thanks! JSY

## 2021-02-08 NOTE — Telephone Encounter (Signed)
Patient stated that her Vitamin D hasn't been sent over to her pharmacy. Clinical staff will follow up with patient.

## 2021-02-26 ENCOUNTER — Other Ambulatory Visit: Payer: Self-pay

## 2021-02-26 ENCOUNTER — Encounter (HOSPITAL_COMMUNITY): Payer: Self-pay

## 2021-02-26 ENCOUNTER — Emergency Department (HOSPITAL_COMMUNITY)
Admission: EM | Admit: 2021-02-26 | Discharge: 2021-02-26 | Disposition: A | Discharge status: Home / Self Care | Payer: BC Managed Care – PPO | Attending: Emergency Medicine | Admitting: Emergency Medicine

## 2021-02-26 DIAGNOSIS — R21 Rash and other nonspecific skin eruption: Secondary | ICD-10-CM | POA: Diagnosis not present

## 2021-02-26 MED ORDER — FAMOTIDINE 20 MG PO TABS: 20.0000 mg | ORAL_TABLET | Freq: Two times a day (BID) | ORAL | 0 refills | Status: DC

## 2021-02-26 MED ORDER — PREDNISONE 10 MG (21) PO TBPK: ORAL_TABLET | Freq: Every day | ORAL | 0 refills | Status: AC

## 2021-02-26 NOTE — Discharge Instructions (Signed)
You are seen today for rash.  As we discussed this could be due to a bug bite or could be due to other causes.  I want you to start taking Benadryl or Zyrtec as prescribed on the bottle, Pepcid which I prescribed you and if the rash does not go away or worsens over the next day you can start the prednisone Dosepak.  Please follow-up with a dermatologist or your primary care doctor in the next couple of days if your rash does not disappear.  If you have any new or worsening concerning symptom please back to the emergency department.  Please begin pharmacist about any new medications prescribed in regards to side effects or interactions with other medications.

## 2021-02-26 NOTE — ED Provider Notes (Signed)
[facility] EMERGENCY DEPARTMENT Provider Note   CSN: [id number] Arrival date & time: 02/26/21  3875     History Chief Complaint  Patient presents with  . Rash    Mary Moses is a 35 y.o. female with pertinent past medical history of depression/anxiety that presents emerged department today with rash to bilateral arms.  Patient states that rash was more present yesterday, took a Benadryl and rash went down, however still itchy.  States that she had 3 bumps on her arms, 2 on bilateral arms and 1 on her right wrist.  Areas were swollen and itchy, have gone down significantly, now is just left with pruritus. No pain.  No other areas are itchy, no other lesions.  States this happened yesterday when she was eating lunch, denies any new foods.  Was outside.,  States that she could have gotten bitten by a bug. Patient denies any new detergents, lotions or soaps.  Denies any fevers.  Denies rash elsewhere.  Denies any angioedema or hive-like rash.  Denies any trouble breathing.  States that she never had a rash like this before.  Denies any hiking or camping or tick bites.  Rashes not on palms or soles.  Denies any joint pain.  No headache or myalgias.Marland Kitchen  HPI     Past Medical History:  Diagnosis Date  . Abdominal bloating 09/16/2015  . Abnormal Pap smear   . Chiari I malformation (HCC)   . Cramps, extremity 09/16/2015  . Drug induced amenorrhea 09/16/2015   Has nexplanon  . GERD (gastroesophageal reflux disease)   . Hypokalemia   . Nexplanon in place 09/16/2015  . Nexplanon insertion 07/09/2013   nexplanon inserted 8/26 left arm remove 07/09/16  . Obesity   . Pregnancy   . Swelling 06/19/2013  . Vaginal Pap smear, abnormal     Patient Active Problem List   Diagnosis Date Noted  . Reactive depression 01/13/2021  . Anxiety 01/13/2021  . Constipation 01/13/2021  . Cellulitis of abdominal wall 03/27/2020  . Postop check 03/27/2020  . Papanicolaou smear of cervix with positive high  risk human papilloma virus (HPV) test 11/29/2019  . Encounter for surveillance of transdermal patch hormonal contraceptive device 11/25/2019  . Dysmenorrhea 11/25/2019  . Menorrhagia with irregular cycle 11/25/2019  . Encounter for gynecological examination with Papanicolaou smear of cervix 11/25/2019  . Chiari I malformation (HCC) 01/24/2019  . Numbness and tingling 11/09/2018  . Gastroesophageal reflux disease 05/10/2017  . History of hypokalemia 05/10/2017  . Cramps, extremity 09/16/2015  . Abdominal bloating 09/16/2015  . Nexplanon in place 09/16/2015  . Drug induced amenorrhea 09/16/2015  . 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 Nexplanon insertion 07/09/2013  . Abnormal Pap smear of cervix 04/01/2013    Past Surgical History:  Procedure Laterality Date  . CESAREAN SECTION N/A 06/08/2013   Procedure: Primary CESAREAN SECTION of baby girl  at 2056 APGAR 8/9;  Surgeon: Tereso Newcomer, MD;  Location: [facility];  Service: Obstetrics;  Laterality: N/A;  . DILATATION AND CURETTAGE/HYSTEROSCOPY WITH MINERVA N/A 02/12/2020   Procedure: DILATATION AND CURETTAGE/HYSTEROSCOPY WITH MINERVA  ABLATION;  Surgeon: Lazaro Arms, MD;  Location: [facility];  Service: Gynecology;  Laterality: N/A;  . LAPAROSCOPIC BILATERAL SALPINGECTOMY Bilateral 02/12/2020   Procedure: LAPAROSCOPIC BILATERAL SALPINGECTOMY;  Surgeon: Lazaro Arms, MD;  Location: [facility];  Service: Gynecology;  Laterality: Bilateral;  . SUBOCCIPITAL CRANIECTOMY CERVICAL LAMINECTOMY  01/24/2019   Procedure: Suboccipital craniectomy/cervical laminectomy/duraplasty;  Surgeon: Tressie Stalker, MD;  Location:  [facility] OR;  Service: Neurosurgery;;     OB History    Gravida  1   Para  1   Term  1   Preterm      AB      Living  1     SAB      IAB      Ectopic      Multiple      Live Births  1           Family History  Problem Relation Age of Onset  . Kidney disease Mother   . Heart disease Mother   . Hypertension Mother    . Colon cancer Mother   . Cancer Mother   . Cancer Father        metastatic prostate   . Hypertension Sister     Social History   Tobacco Use  . Smoking status: Never Smoker  . Smokeless tobacco: Never Used  Vaping Use  . Vaping Use: Never used  Substance Use Topics  . Alcohol use: No  . Drug use: No    Home Medications Prior to Admission medications   Medication Sig Start Date End Date Taking? Authorizing Provider  Cholecalciferol 1.25 MG (50000 UT) capsule Take 1 weekly 02/08/21  Yes Cyril Mourning A, NP  famotidine (PEPCID) 20 MG tablet Take 1 tablet (20 mg total) by mouth 2 (two) times daily for 3 days. 02/26/21 03/01/21 Yes Abir Craine, PA-C  pantoprazole (PROTONIX) 20 MG tablet Take 20 mg by mouth daily. 02/08/21  Yes [provider]  predniSONE (STERAPRED UNI-PAK 21 TAB) 10 MG (21) TBPK tablet Take by mouth daily. Take as directed 02/26/21  Yes Aprill Banko, PA-C  vitamin B-12 (CYANOCOBALAMIN) 500 MCG tablet Take 500 mcg by mouth daily.   Yes [provider]  escitalopram (LEXAPRO) 10 MG tablet Take 1 tablet (10 mg total) by mouth daily. Patient not taking: No sig reported 01/13/21 01/13/22  Adline Potter, NP  esomeprazole (NEXIUM) 40 MG capsule Take 1 capsule (40 mg total) by mouth daily at 12 noon. Patient not taking: Reported on 02/26/2021 01/13/21   Cyril Mourning A, NP  hydrOXYzine (ATARAX/VISTARIL) 10 MG tablet Take 1 tablet (10 mg total) by mouth 3 (three) times daily as needed. Patient not taking: Reported on 02/26/2021 01/13/21   Adline Potter, NP  linaclotide Karlene Einstein) 145 MCG CAPS capsule Take 1 capsule (145 mcg total) by mouth daily before breakfast. Patient not taking: Reported on 02/26/2021 01/13/21   Adline Potter, NP    Allergies    Penicillins and Tramadol hcl  Review of Systems   Review of Systems  Constitutional: Negative for diaphoresis, fatigue and fever.  Eyes: Negative for visual disturbance.  Respiratory: Negative  for shortness of breath.   Cardiovascular: Negative for chest pain.  Gastrointestinal: Negative for nausea and vomiting.  Musculoskeletal: Negative for back pain and myalgias.  Skin: Positive for rash. Negative for color change, pallor and wound.  Neurological: Negative for syncope, weakness, light-headedness, numbness and headaches.  Psychiatric/Behavioral: Negative for behavioral problems and confusion.    Physical Exam Updated Vital Signs BP 122/66 (BP Location: Left Arm)   Pulse 76   Temp 98.4 F (36.9 C) (Oral)   Resp 17   Ht 5\' 5"  (1.651 m)   Wt 98.4 kg   SpO2 100%   BMI 36.11 kg/m   Physical Exam Constitutional:      General: She is not in acute distress.    Appearance:  Normal appearance. She is not ill-appearing, toxic-appearing or diaphoretic.  HENT:     Head: Normocephalic and atraumatic.     Comments: No angioedema.    Mouth/Throat:     Mouth: Mucous membranes are moist.  Eyes:     Extraocular Movements: Extraocular movements intact.     Pupils: Pupils are equal, round, and reactive to light.  Cardiovascular:     Rate and Rhythm: Normal rate and regular rhythm.     Pulses: Normal pulses.  Pulmonary:     Effort: Pulmonary effort is normal.     Breath sounds: Normal breath sounds.  Abdominal:     General: Abdomen is flat.     Palpations: Abdomen is soft.  Musculoskeletal:        General: No swelling or tenderness. Normal range of motion.     Cervical back: Normal range of motion. No rigidity.  Skin:    General: Skin is warm and dry.     Capillary Refill: Capillary refill takes less than 2 seconds.     Comments: No noticeable rash noted on bilateral arms or wrist.  Do notice some areas where patient has been scratching.  No bug bites noted.  No signs of infection.  Normal range of motion to extremities with no joint pain.  No erythema or swelling noted.  Radial pulse 2+.  Neurological:     General: No focal deficit present.     Mental Status: She is alert  and oriented to person, place, and time.  Psychiatric:        Mood and Affect: Mood normal.        Behavior: Behavior normal.        Thought Content: Thought content normal.     ED Results / Procedures / Treatments   Labs (all labs ordered are listed, but only abnormal results are displayed) Labs Reviewed - No data to display  EKG None  Radiology No results found.  Procedures Procedures   Medications Ordered in ED Medications - No data to display  ED Course  I have reviewed the triage vital signs and the nursing notes.  Pertinent labs & imaging results that were available during my care of the patient were reviewed by me and considered in my medical decision making (see chart for details).    MDM Rules/Calculators/A&P                          Patient presenting with rash, appears as if it is getting better since I do not see any rash present on exam.  Patient is still stating that she is itchy, symptomatic treatment discussed including Benadryl, Pepcid and discussed that if symptoms worsen or reappear that I will also prescribe prednisone Dosepak.  Did discuss side effects of this.  No need for IV medication at this time, no signs of anaphylaxis.  Patient most likely with bug bite from yesterday, to be discharged at this time.  Symptomatic treatment discussed and return precautions given.  Doubt need for further emergent work up at this time. I explained the diagnosis and have given explicit precautions to return to the ER including for any other new or worsening symptoms. The patient understands and accepts the medical plan as it's been dictated and I have answered their questions. Discharge instructions concerning home care and prescriptions have been given. The patient is STABLE and is discharged to home in good condition.   Final Clinical Impression(s) / ED Diagnoses Final  diagnoses:  Rash and nonspecific skin eruption    Rx / DC Orders ED Discharge Orders          Ordered    famotidine (PEPCID) 20 MG tablet  2 times daily        02/26/21 1028    predniSONE (STERAPRED UNI-PAK 21 TAB) 10 MG (21) TBPK tablet  Daily        02/26/21 1028           Farrel Gordon, PA-C 02/26/21 1033    Bethann Berkshire, MD 03/02/21 [phone]

## 2021-02-26 NOTE — ED Triage Notes (Signed)
Pt presented to ED with rash area to bilateral arms. Pt took Benadryl last night and rash appeared yesterday.

## 2021-02-26 NOTE — ED Notes (Signed)
Warm blanket given

## 2021-02-27 IMAGING — DX DG ABDOMEN ACUTE W/ 1V CHEST
4 series · 4 of 4 positions shown · non-contrast
Comparison: 05/28/2018

CLINICAL DATA: Nausea and vomiting.

EXAM:
DG ABDOMEN ACUTE W/ 1V CHEST

[chest pa]
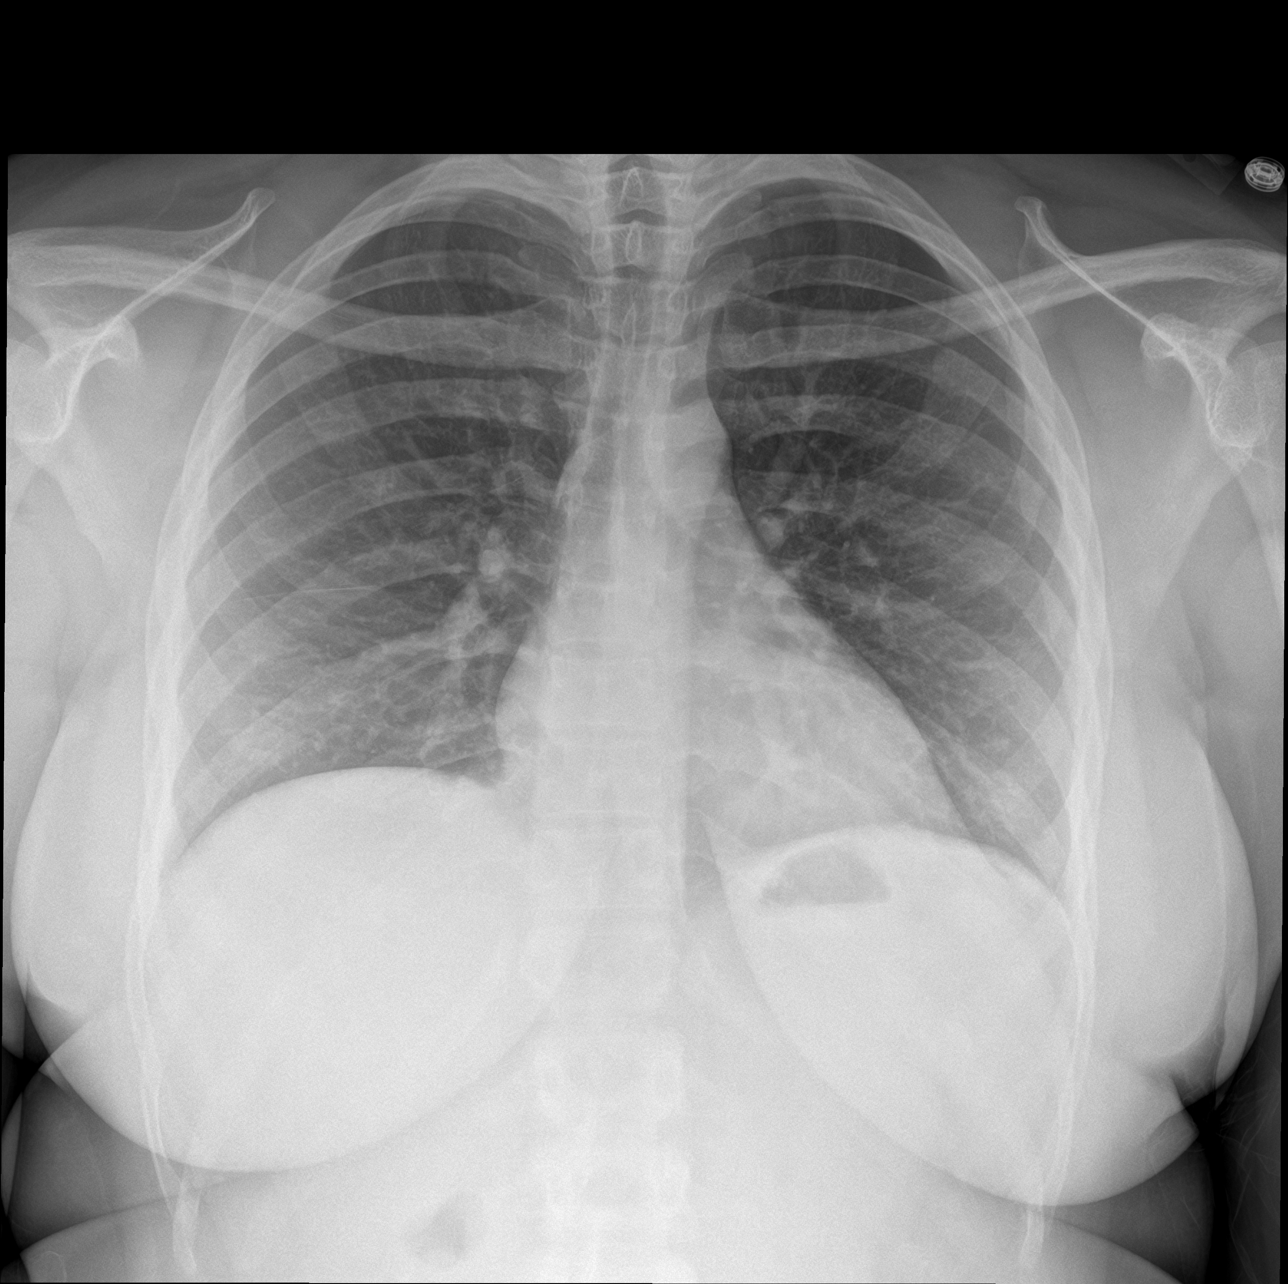

[abdomen erect]
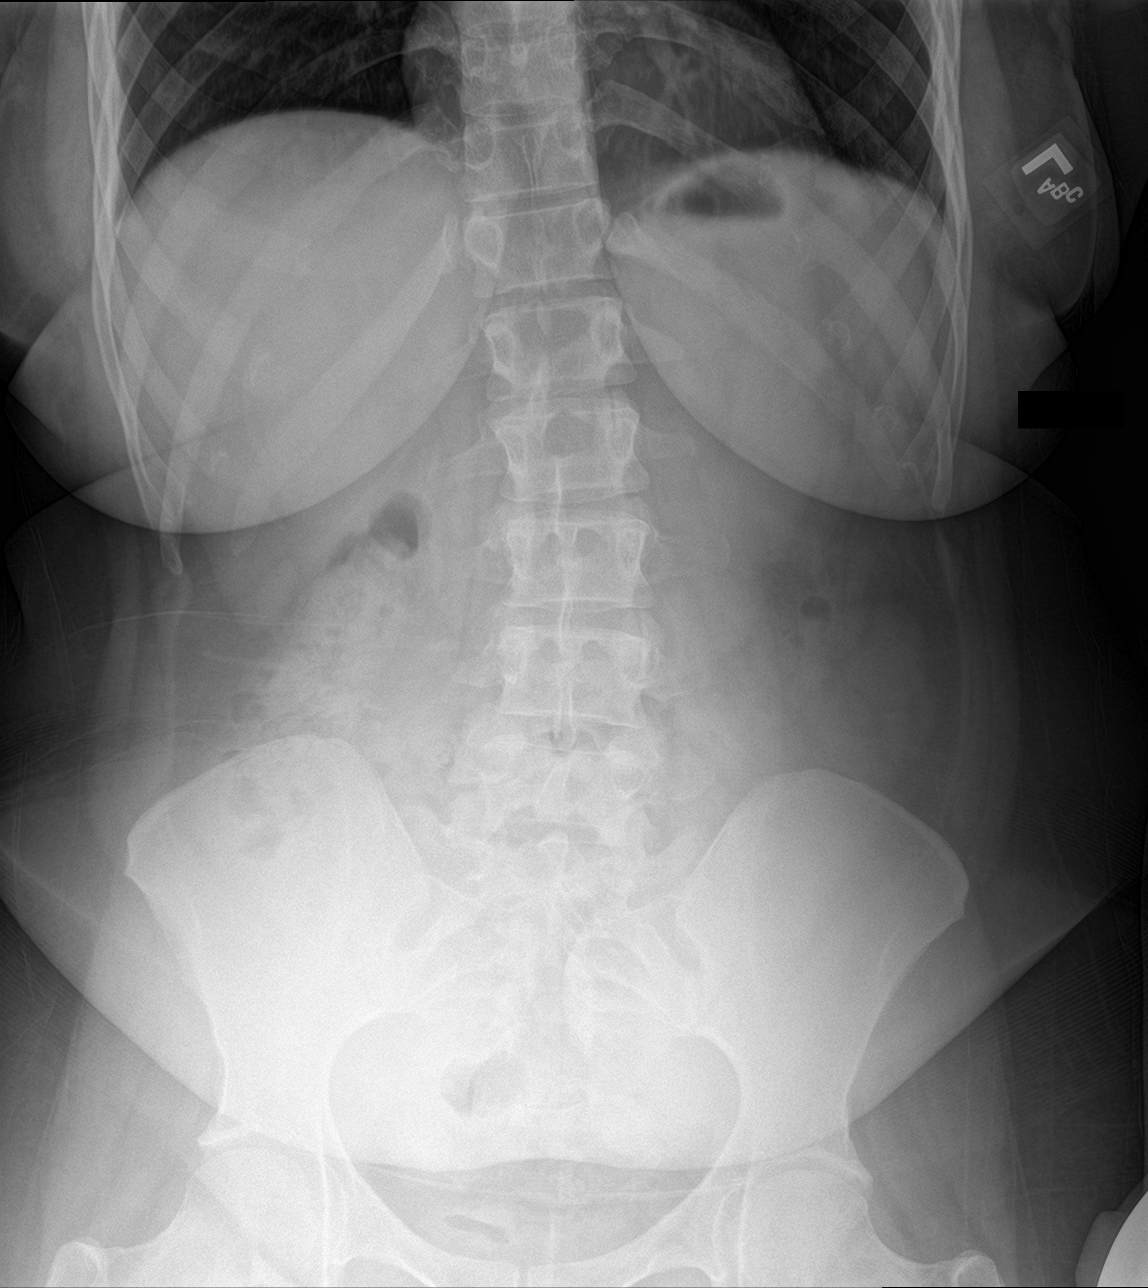

[abdomen supine (1 of 2)]
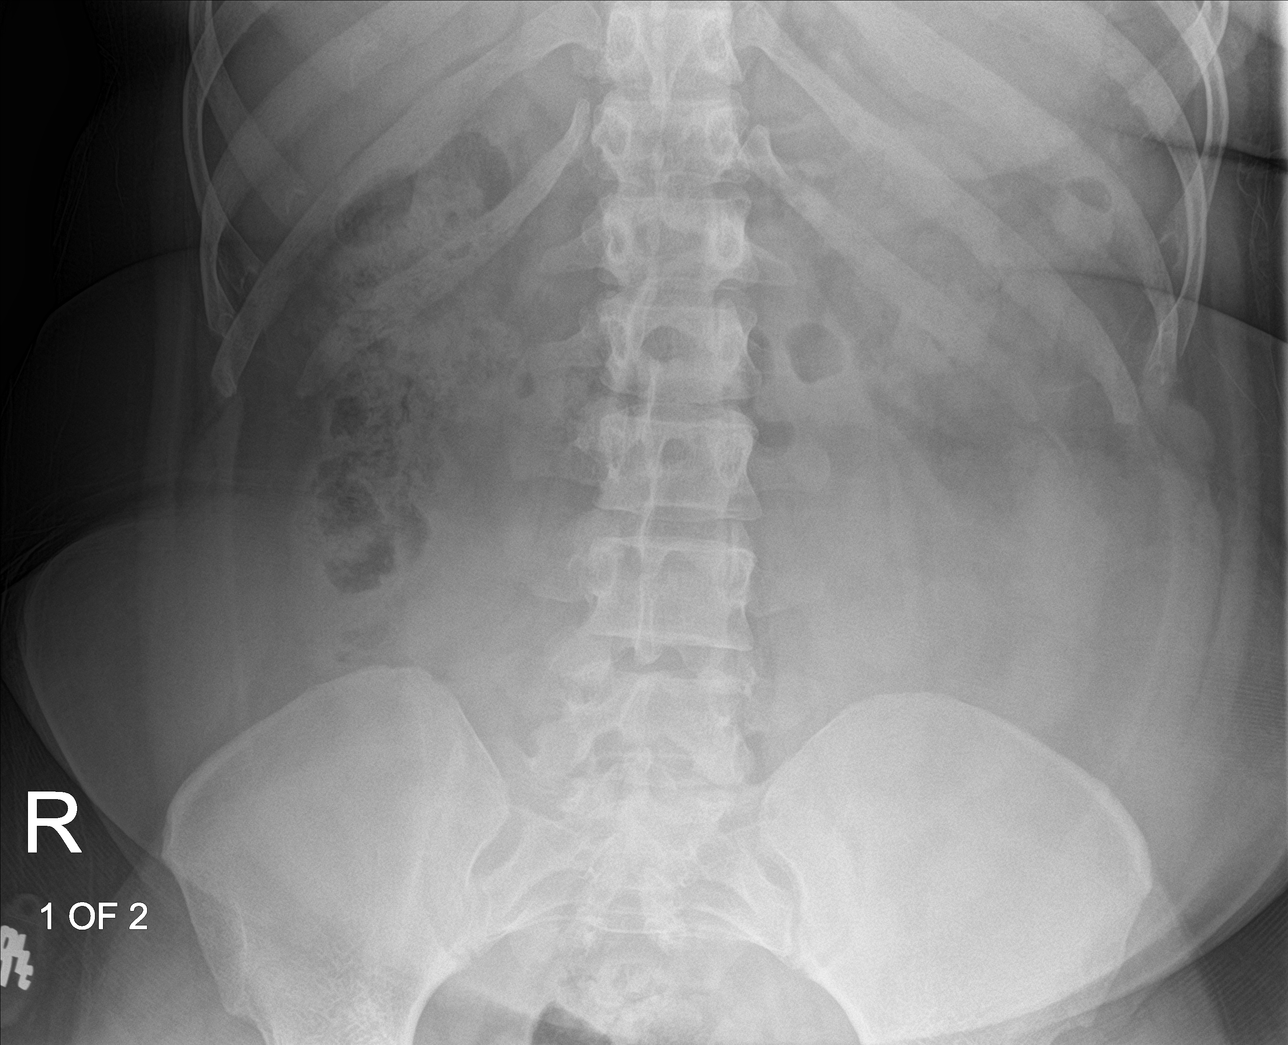

[abdomen supine (2 of 2)]
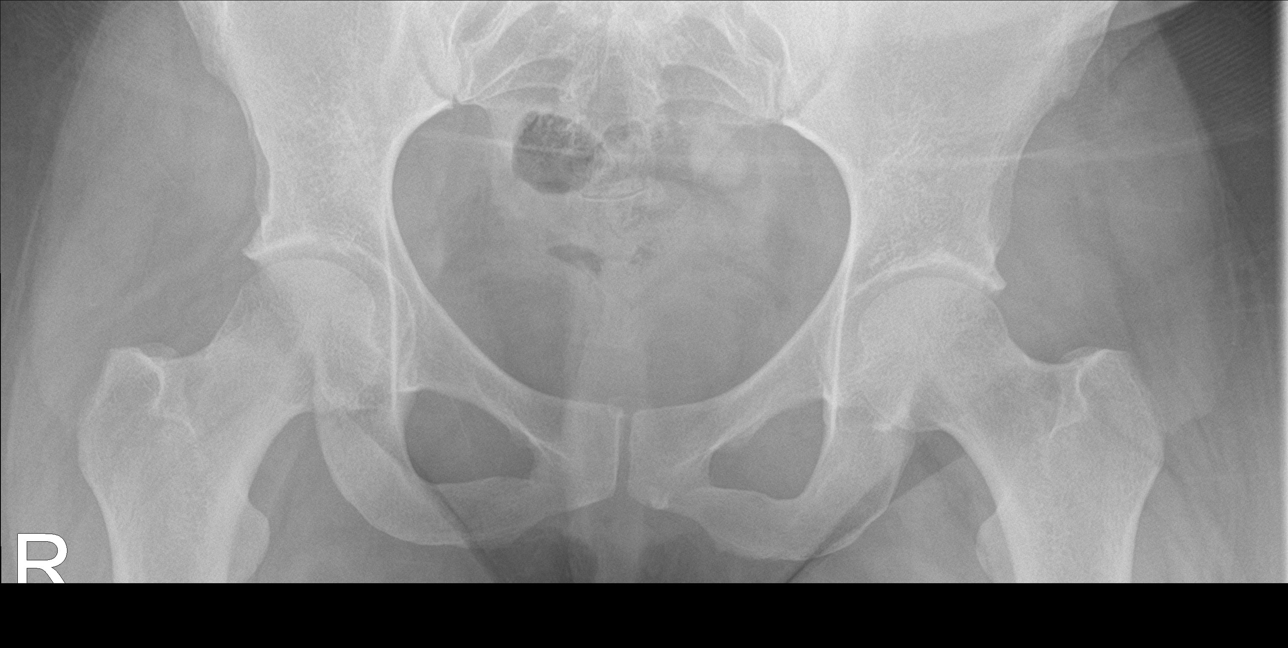

[4 of 4 positions shown; findings below may reference images not displayed]

FINDINGS: The cardiomediastinal contours are normal. Low lung volumes. There
is no free intra-abdominal air. No dilated bowel loops to suggest
obstruction. Small volume of stool throughout the colon. No
radiopaque calculi. No acute osseous abnormalities are seen.
IMPRESSION: Negative abdominal radiographs. Low lung volumes without acute chest
finding.

## 2021-03-05 DIAGNOSIS — L853 Xerosis cutis: Secondary | ICD-10-CM | POA: Diagnosis not present

## 2021-03-10 ENCOUNTER — Ambulatory Visit: Payer: BC Managed Care – PPO | Admitting: Adult Health

## 2021-03-16 DIAGNOSIS — Z111 Encounter for screening for respiratory tuberculosis: Secondary | ICD-10-CM | POA: Diagnosis not present

## 2021-04-05 DIAGNOSIS — Z20822 Contact with and (suspected) exposure to covid-19: Secondary | ICD-10-CM | POA: Diagnosis not present

## 2022-01-05 ENCOUNTER — Telehealth: Payer: Self-pay

## 2022-01-05 MED ORDER — PANTOPRAZOLE SODIUM 20 MG PO TBEC: 20.0000 mg | DELAYED_RELEASE_TABLET | Freq: Every day | ORAL | 3 refills | Status: AC

## 2022-01-05 MED ORDER — CHOLECALCIFEROL 1.25 MG (50000 UT) PO CAPS: ORAL_CAPSULE | ORAL | 4 refills | Status: AC

## 2022-01-05 NOTE — Telephone Encounter (Signed)
Refilled protonix and vitamin D

## 2022-01-05 NOTE — Telephone Encounter (Signed)
Pt called and stated that she needs a refill on her Vit D

## 2022-01-05 NOTE — Telephone Encounter (Signed)
Pt is requesting a refill on Vit D and Protonix. Thanks!! JSY

## 2022-04-27 ENCOUNTER — Telehealth: Payer: Self-pay

## 2022-04-27 NOTE — Telephone Encounter (Signed)
Pt called and stated that she had surgery a few years ago and was told that she would not have a menstrual cycle. Yesterday she started having a cycle and wanted to know if it was normal.  She wanted to speak with a nurse to see if this is normal or should she make an appointment to come in and be seen.

## 2022-04-27 NOTE — Telephone Encounter (Signed)
Spoke with patient. She started having a period last night. She had an ablation about 2 years ago. Bleeding is not heavy or painful. Pt remembers that Dr. Elonda Husky told her that she may or may not have periods again. Advised that she is due for her annual exam so she can schedule that and we can see how the bleeding has been at that time. Pt agrees and said she will call back to schedule a visit later. No other questions at this time.

## 2022-08-01 ENCOUNTER — Ambulatory Visit: Payer: BC Managed Care – PPO | Admitting: Adult Health

## 2023-10-18 ENCOUNTER — Other Ambulatory Visit: Payer: Self-pay

## 2024-05-28 ENCOUNTER — Other Ambulatory Visit: Payer: Self-pay

## 2024-08-01 ENCOUNTER — Other Ambulatory Visit (HOSPITAL_COMMUNITY): Payer: Self-pay | Admitting: Family Medicine

## 2024-08-01 DIAGNOSIS — Z1231 Encounter for screening mammogram for malignant neoplasm of breast: Secondary | ICD-10-CM

## 2024-08-09 ENCOUNTER — Ambulatory Visit (HOSPITAL_COMMUNITY)
Admission: RE | Admit: 2024-08-09 | Discharge: 2024-08-09 | Disposition: A | Discharge status: Home / Self Care | Payer: Self-pay | Source: Ambulatory Visit | Attending: Family Medicine | Admitting: Family Medicine

## 2024-08-09 DIAGNOSIS — Z1231 Encounter for screening mammogram for malignant neoplasm of breast: Secondary | ICD-10-CM | POA: Insufficient documentation

## 2024-10-06 ENCOUNTER — Ambulatory Visit: Admission: EM | Admit: 2024-10-06 | Discharge: 2024-10-06 | Disposition: A | Discharge status: Home / Self Care

## 2024-10-06 ENCOUNTER — Ambulatory Visit

## 2024-10-06 DIAGNOSIS — M25561 Pain in right knee: Secondary | ICD-10-CM

## 2024-10-06 MED ORDER — DICLOFENAC SODIUM 1 % EX GEL: 4.0000 g | Freq: Four times a day (QID) | CUTANEOUS | 0 refills | Status: AC

## 2024-10-06 NOTE — ED Triage Notes (Signed)
 Pt reports she has right knee pain after her knee gave out causing her to fall on the floor x 1 day

## 2024-10-06 NOTE — Discharge Instructions (Addendum)
 The x-ray was negative for fracture or dislocation. Apply medication as prescribed for any pain or discomfort. A knee brace has been provided to allow for additional compression and support.  Wear the brace when you are engaged in prolonged or strenuous activity. RICE therapy, rest, ice, compression, and elevation.  Apply ice for 20 minutes, remove for 1 hour, repeat as needed. As discussed, if your symptoms fail to improve, or begin to worsen, recommend follow-up with orthopedics or with your primary care physician for further evaluation. Follow-up as needed.

## 2024-10-06 NOTE — ED Provider Notes (Signed)
 [facility]    CSN: [id number] Arrival date & time: 10/06/24  1056      History   Chief Complaint Chief Complaint  Patient presents with   Knee Pain    HPI Mary Moses is a 38 y.o. female.   The history is provided by the patient.   Patient presents for complaints of right knee pain.  Patient states that she experienced a fall after the right knee gave out on yesterday.  She states since that time, she has had pain to the inside of the knee and behind the knee.  She states that the pain worsens when she is walking.  She denies recent injury or trauma, numbness, tingling, radiation of pain, or the inability to bear weight.  Patient states I feel like there is something pulling in my knee.  States that she applied a heating pad to the knee.  States that she had a similar episode occur several years ago.  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   Anxiety 01/13/2021   Constipation 01/13/2021   Cellulitis of abdominal wall 03/27/2020   Postop check 03/27/2020   Papanicolaou smear of cervix with positive high risk human papilloma virus (HPV) test 11/29/2019   Encounter for surveillance of transdermal patch hormonal contraceptive device 11/25/2019   Dysmenorrhea 11/25/2019   Menorrhagia with irregular cycle 11/25/2019   Encounter for gynecological examination with Papanicolaou smear of cervix 11/25/2019   Chiari I malformation (HCC) 01/24/2019   Numbness  and tingling 11/09/2018   Gastroesophageal reflux disease 05/10/2017   History of hypokalemia 05/10/2017   Cramps, extremity 09/16/2015   Abdominal bloating 09/16/2015   Nexplanon  in place 09/16/2015   Drug induced amenorrhea 09/16/2015   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   Nexplanon  insertion 07/09/2013   Abnormal Pap smear of cervix 04/01/2013    Past Surgical History:  Procedure Laterality Date   CESAREAN SECTION N/A 06/08/2013   Procedure: Primary CESAREAN SECTION of baby girl  at 2056 APGAR 8/9;  Surgeon: Gloris DELENA Hugger, MD;  Location: [facility];  Service: Obstetrics;  Laterality: N/A;   DILATATION AND CURETTAGE/HYSTEROSCOPY WITH MINERVA N/A 02/12/2020   Procedure: DILATATION AND CURETTAGE/HYSTEROSCOPY WITH MINERVA  ABLATION;  Surgeon: Jayne Vonn DEL, MD;  Location: [facility];  Service: Gynecology;  Laterality: N/A;   LAPAROSCOPIC BILATERAL SALPINGECTOMY Bilateral 02/12/2020   Procedure: LAPAROSCOPIC BILATERAL SALPINGECTOMY;  Surgeon: Jayne Vonn DEL, MD;  Location: [facility];  Service: Gynecology;  Laterality: Bilateral;   SUBOCCIPITAL CRANIECTOMY CERVICAL LAMINECTOMY  01/24/2019   Procedure: Suboccipital craniectomy/cervical laminectomy/duraplasty;  Surgeon: Mavis Purchase, MD;  Location: [facility];  Service: Neurosurgery;;    OB History     Gravida  1   Para  1   Term  1   Preterm      AB      Living  1      SAB      IAB  Ectopic      Multiple      Live Births  1            Home Medications    Prior to Admission medications   Medication Sig Start Date End Date Taking? Authorizing Provider  Cholecalciferol  1.25 MG (50000 UT) capsule Take 1 weekly 01/05/22   Signa Nest A, NP  escitalopram  (LEXAPRO ) 10 MG tablet Take 1 tablet (10 mg total) by mouth daily. Patient not taking: Reported on 02/26/2021 01/13/21 01/13/22  Signa Nest LABOR, NP  hydrOXYzine  (ATARAX /VISTARIL ) 10 MG tablet Take 1 tablet (10 mg total) by mouth 3 (three) times daily as  needed. Patient not taking: Reported on 02/26/2021 01/13/21   Signa Nest LABOR, NP  linaclotide  (LINZESS ) 145 MCG CAPS capsule Take 1 capsule (145 mcg total) by mouth daily before breakfast. Patient not taking: Reported on 02/26/2021 01/13/21   Signa Nest LABOR, NP  pantoprazole  (PROTONIX ) 20 MG tablet Take 1 tablet (20 mg total) by mouth daily. 01/05/22   Signa Nest LABOR, NP  phentermine (ADIPEX-P) 37.5 MG tablet Take 37.5 mg by mouth daily.    [provider]  predniSONE  (STERAPRED UNI-PAK 21 TAB) 10 MG (21) TBPK tablet Take by mouth daily. Take as directed 02/26/21   Tobie Schatz, PA-C  vitamin B-12 (CYANOCOBALAMIN) 500 MCG tablet Take 500 mcg by mouth daily.    [provider]    Family History Family History  Problem Relation Age of Onset   Kidney disease Mother    Heart disease Mother    Hypertension Mother    Colon cancer Mother    Cancer Mother    Cancer Father        metastatic prostate    Hypertension Sister     Social History Social History   Tobacco Use   Smoking status: Never   Smokeless tobacco: Never  Vaping Use   Vaping status: Never Used  Substance Use Topics   Alcohol use: No   Drug use: No     Allergies   Penicillins and Tramadol hcl   Review of Systems Review of Systems Per HPI  Physical Exam Triage Vital Signs ED Triage Vitals [10/06/24 1201]  Encounter Vitals Group     BP 109/70     Girls Systolic BP Percentile      Girls Diastolic BP Percentile      Boys Systolic BP Percentile      Boys Diastolic BP Percentile      Pulse Rate 91     Resp 18     Temp 98.1 F (36.7 C)     Temp Source Oral     SpO2 98 %     Weight      Height      Head Circumference      Peak Flow      Pain Score 10     Pain Loc      Pain Education      Exclude from Growth Chart    No data found.  Updated Vital Signs BP 109/70 (BP Location: Right Arm)   Pulse 91   Temp 98.1 F (36.7 C) (Oral)   Resp 18   LMP 09/05/2024 (Approximate)  Comment: still mentrastes periodically  SpO2 98%   Visual Acuity Right Eye Distance:   Left Eye Distance:   Bilateral Distance:    Right Eye Near:   Left Eye Near:    Bilateral Near:     Physical Exam Vitals and nursing note  reviewed.  Constitutional:      General: She is not in acute distress.    Appearance: Normal appearance.  HENT:     Head: Normocephalic.  Eyes:     Extraocular Movements: Extraocular movements intact.     Pupils: Pupils are equal, round, and reactive to light.  Pulmonary:     Effort: Pulmonary effort is normal.  Musculoskeletal:     Cervical back: Normal range of motion.     Right knee: No swelling, deformity, erythema or ecchymosis. Decreased range of motion. Tenderness present over the medial joint line and PCL. Normal pulse.  Skin:    General: Skin is warm and dry.  Neurological:     General: No focal deficit present.     Mental Status: She is alert and oriented to person, place, and time.  Psychiatric:        Mood and Affect: Mood normal.        Behavior: Behavior normal.      UC Treatments / Results  Labs (all labs ordered are listed, but only abnormal results are displayed) Labs Reviewed - No data to display  EKG   Radiology DG Knee Complete 4 Views Right Result Date: 10/06/2024 EXAM: 4 OR MORE VIEW(S) XRAY OF THE RIGHT KNEE 10/06/2024 12:33:17 PM COMPARISON: 06/28/2017 CLINICAL HISTORY: 38 year old female. Knee gave out causing patient to fall. FINDINGS: BONES AND JOINTS: No acute fracture. No focal osseous lesion. No joint dislocation. No significant joint effusion. Joint spaces and alignment are normal for age. SOFT TISSUES: The soft tissues are unremarkable. IMPRESSION: 1. No acute fracture or dislocation identified about the right knee. Electronically signed by: Helayne Hurst MD 10/06/2024 12:59 PM EST RP Workstation: [id number]    Procedures Procedures (including critical care time)  Medications Ordered in UC Medications - No  data to display  Initial Impression / Assessment and Plan / UC Course  I have reviewed the triage vital signs and the nursing notes.  Pertinent labs & imaging results that were available during my care of the patient were reviewed by me and considered in my medical decision making (see chart for details).  X-ray of the right knee was negative for fracture or dislocation.  No obvious bruising, swelling, or deformity noted on exam.  She does have tenderness to the medial aspect into the posterior right knee.  A hinged knee brace was provided to allow for additional compression, support, and stability of the right lower extremity.  Voltaren  gel prescribed for patient to apply topically for knee pain.  Supportive care recommendations were provided and discussed with the patient to include RICE therapy, and over-the-counter analgesics.  Discussed in with patient regarding follow-up if symptoms fail to improve, advised patient that she may need an MRI which would be the imaging of choice for ongoing symptoms or worsening symptoms.  Patient was in agreement with this plan of care and verbalizes understanding.  All questions were answered.  Patient stable for discharge.  Work note was provided.  Final Clinical Impressions(s) / UC Diagnoses   Final diagnoses:  Acute pain of right knee   Discharge Instructions   None    ED Prescriptions   None    PDMP not reviewed this encounter.   Gilmer Etta PARAS, NP 10/06/24 1350
